# Patient Record
Sex: Female | Born: 1945
Health system: Southern US, Community
[De-identification: ages and names within clinical notes are randomized; demographics above are authoritative.]

## PROBLEM LIST (undated history)

## (undated) DIAGNOSIS — R7309 Other abnormal glucose: Secondary | ICD-10-CM

## (undated) DIAGNOSIS — E785 Hyperlipidemia, unspecified: Secondary | ICD-10-CM

## (undated) DIAGNOSIS — J449 Chronic obstructive pulmonary disease, unspecified: Secondary | ICD-10-CM

## (undated) DIAGNOSIS — K9041 Non-celiac gluten sensitivity: Secondary | ICD-10-CM

## (undated) DIAGNOSIS — R202 Paresthesia of skin: Secondary | ICD-10-CM

## (undated) DIAGNOSIS — T7840XA Allergy, unspecified, initial encounter: Secondary | ICD-10-CM

## (undated) DIAGNOSIS — J45909 Unspecified asthma, uncomplicated: Secondary | ICD-10-CM

## (undated) DIAGNOSIS — J4489 Other specified chronic obstructive pulmonary disease: Secondary | ICD-10-CM

## (undated) DIAGNOSIS — K219 Gastro-esophageal reflux disease without esophagitis: Secondary | ICD-10-CM

## (undated) DIAGNOSIS — F3289 Other specified depressive episodes: Secondary | ICD-10-CM

## (undated) DIAGNOSIS — H269 Unspecified cataract: Secondary | ICD-10-CM

## (undated) DIAGNOSIS — F419 Anxiety disorder, unspecified: Secondary | ICD-10-CM

## (undated) DIAGNOSIS — M129 Arthropathy, unspecified: Secondary | ICD-10-CM

## (undated) DIAGNOSIS — M542 Cervicalgia: Secondary | ICD-10-CM

## (undated) DIAGNOSIS — M549 Dorsalgia, unspecified: Secondary | ICD-10-CM

## (undated) DIAGNOSIS — I10 Essential (primary) hypertension: Secondary | ICD-10-CM

## (undated) DIAGNOSIS — M949 Disorder of cartilage, unspecified: Secondary | ICD-10-CM

## (undated) DIAGNOSIS — F329 Major depressive disorder, single episode, unspecified: Secondary | ICD-10-CM

## (undated) DIAGNOSIS — M899 Disorder of bone, unspecified: Secondary | ICD-10-CM

## (undated) HISTORY — DX: Disorder of bone, unspecified: M89.9

## (undated) HISTORY — DX: Major depressive disorder, single episode, unspecified: F32.9

## (undated) HISTORY — PX: CARPAL TUNNEL RELEASE: SHX101

## (undated) HISTORY — PX: ELBOW SURGERY: SHX618

## (undated) HISTORY — DX: Dorsalgia, unspecified: M54.9

## (undated) HISTORY — DX: Paresthesia of skin: R20.2

## (undated) HISTORY — DX: Unspecified asthma, uncomplicated: J45.909

## (undated) HISTORY — PX: ABDOMINAL HYSTERECTOMY: SHX81

## (undated) HISTORY — DX: Cervicalgia: M54.2

## (undated) HISTORY — DX: Chronic obstructive pulmonary disease, unspecified: J44.9

## (undated) HISTORY — DX: Other abnormal glucose: R73.09

## (undated) HISTORY — DX: Arthropathy, unspecified: M12.9

## (undated) HISTORY — DX: Essential (primary) hypertension: I10

## (undated) HISTORY — DX: Other specified depressive episodes: F32.89

## (undated) HISTORY — DX: Unspecified cataract: H26.9

## (undated) HISTORY — DX: Gastro-esophageal reflux disease without esophagitis: K21.9

## (undated) HISTORY — PX: COLONOSCOPY: SHX174

## (undated) HISTORY — DX: Disorder of bone, unspecified: M94.9

## (undated) HISTORY — DX: Non-celiac gluten sensitivity: K90.41

## (undated) HISTORY — DX: Allergy, unspecified, initial encounter: T78.40XA

## (undated) HISTORY — DX: Hyperlipidemia, unspecified: E78.5

## (undated) HISTORY — DX: Anxiety disorder, unspecified: F41.9

## (undated) HISTORY — DX: Other specified chronic obstructive pulmonary disease: J44.89

---

## 1998-03-30 ENCOUNTER — Other Ambulatory Visit: Admission: RE | Admit: 1998-03-30 | Discharge: 1998-03-30 | Payer: Self-pay | Admitting: Gynecology

## 1998-06-10 ENCOUNTER — Ambulatory Visit (HOSPITAL_COMMUNITY): Admission: RE | Admit: 1998-06-10 | Discharge: 1998-06-10 | Payer: Self-pay | Admitting: Internal Medicine

## 1998-09-22 ENCOUNTER — Other Ambulatory Visit: Admission: RE | Admit: 1998-09-22 | Discharge: 1998-09-22 | Payer: Self-pay | Admitting: Gynecology

## 1998-11-17 ENCOUNTER — Other Ambulatory Visit: Admission: RE | Admit: 1998-11-17 | Discharge: 1998-11-17 | Payer: Self-pay | Admitting: Otolaryngology

## 1999-03-24 ENCOUNTER — Ambulatory Visit (HOSPITAL_COMMUNITY): Admission: RE | Admit: 1999-03-24 | Discharge: 1999-03-24 | Payer: Self-pay | Admitting: Internal Medicine

## 1999-03-24 ENCOUNTER — Encounter: Payer: Self-pay | Admitting: Internal Medicine

## 1999-11-23 ENCOUNTER — Ambulatory Visit (HOSPITAL_BASED_OUTPATIENT_CLINIC_OR_DEPARTMENT_OTHER): Admission: RE | Admit: 1999-11-23 | Discharge: 1999-11-23 | Payer: Self-pay | Admitting: Orthopedic Surgery

## 2000-02-09 ENCOUNTER — Other Ambulatory Visit: Admission: RE | Admit: 2000-02-09 | Discharge: 2000-02-09 | Payer: Self-pay | Admitting: Gynecology

## 2000-05-16 ENCOUNTER — Ambulatory Visit (HOSPITAL_BASED_OUTPATIENT_CLINIC_OR_DEPARTMENT_OTHER): Admission: RE | Admit: 2000-05-16 | Discharge: 2000-05-16 | Payer: Self-pay | Admitting: Orthopedic Surgery

## 2001-04-25 ENCOUNTER — Other Ambulatory Visit: Admission: RE | Admit: 2001-04-25 | Discharge: 2001-04-25 | Payer: Self-pay | Admitting: Gynecology

## 2001-08-29 ENCOUNTER — Other Ambulatory Visit: Admission: RE | Admit: 2001-08-29 | Discharge: 2001-08-29 | Payer: Self-pay | Admitting: Gynecology

## 2002-06-05 ENCOUNTER — Other Ambulatory Visit: Admission: RE | Admit: 2002-06-05 | Discharge: 2002-06-05 | Payer: Self-pay | Admitting: Gynecology

## 2003-07-24 ENCOUNTER — Other Ambulatory Visit: Admission: RE | Admit: 2003-07-24 | Discharge: 2003-07-24 | Payer: Self-pay | Admitting: Gynecology

## 2004-08-18 ENCOUNTER — Other Ambulatory Visit: Admission: RE | Admit: 2004-08-18 | Discharge: 2004-08-18 | Payer: Self-pay | Admitting: Gynecology

## 2005-01-07 ENCOUNTER — Ambulatory Visit: Payer: Self-pay | Admitting: Internal Medicine

## 2005-01-19 ENCOUNTER — Ambulatory Visit: Payer: Self-pay | Admitting: Internal Medicine

## 2005-04-08 ENCOUNTER — Ambulatory Visit: Payer: Self-pay | Admitting: Internal Medicine

## 2005-04-27 ENCOUNTER — Ambulatory Visit: Payer: Self-pay | Admitting: Internal Medicine

## 2005-06-07 ENCOUNTER — Ambulatory Visit: Payer: Self-pay | Admitting: Internal Medicine

## 2005-06-22 ENCOUNTER — Ambulatory Visit: Payer: Self-pay | Admitting: Internal Medicine

## 2005-12-21 ENCOUNTER — Ambulatory Visit: Payer: Self-pay | Admitting: Internal Medicine

## 2005-12-28 ENCOUNTER — Ambulatory Visit: Payer: Self-pay | Admitting: Internal Medicine

## 2006-04-05 ENCOUNTER — Ambulatory Visit: Payer: Self-pay | Admitting: Internal Medicine

## 2006-09-06 ENCOUNTER — Ambulatory Visit: Payer: Self-pay | Admitting: Internal Medicine

## 2006-11-29 ENCOUNTER — Ambulatory Visit: Payer: Self-pay | Admitting: Internal Medicine

## 2007-01-26 ENCOUNTER — Ambulatory Visit: Payer: Self-pay | Admitting: Internal Medicine

## 2007-06-06 ENCOUNTER — Ambulatory Visit: Payer: Self-pay | Admitting: Internal Medicine

## 2007-06-06 LAB — CONVERTED CEMR LAB
ALT: 19 units/L (ref 0–35)
AST: 20 units/L (ref 0–37)
Albumin: 3.9 g/dL (ref 3.5–5.2)
Alkaline Phosphatase: 55 units/L (ref 39–117)
BUN: 10 mg/dL (ref 6–23)
Basophils Absolute: 0 10*3/uL (ref 0.0–0.1)
Basophils Relative: 0.6 % (ref 0.0–1.0)
Bilirubin Urine: NEGATIVE
Bilirubin, Direct: 0.1 mg/dL (ref 0.0–0.3)
CO2: 32 meq/L (ref 19–32)
Calcium: 9.8 mg/dL (ref 8.4–10.5)
Chloride: 105 meq/L (ref 96–112)
Creatinine, Ser: 0.5 mg/dL (ref 0.4–1.2)
Eosinophils Absolute: 0.2 10*3/uL (ref 0.0–0.6)
Eosinophils Relative: 3.3 % (ref 0.0–5.0)
GFR calc Af Amer: 161 mL/min
GFR calc non Af Amer: 133 mL/min
Glucose, Bld: 120 mg/dL — ABNORMAL HIGH (ref 70–99)
HCT: 44.5 % (ref 36.0–46.0)
Hemoglobin, Urine: NEGATIVE
Hemoglobin: 15.2 g/dL — ABNORMAL HIGH (ref 12.0–15.0)
Ketones, ur: NEGATIVE mg/dL
Leukocytes, UA: NEGATIVE
Lymphocytes Relative: 37.1 % (ref 12.0–46.0)
MCHC: 34.1 g/dL (ref 30.0–36.0)
MCV: 86.6 fL (ref 78.0–100.0)
Monocytes Absolute: 0.4 10*3/uL (ref 0.2–0.7)
Monocytes Relative: 6.7 % (ref 3.0–11.0)
Neutro Abs: 3.6 10*3/uL (ref 1.4–7.7)
Neutrophils Relative %: 52.3 % (ref 43.0–77.0)
Nitrite: NEGATIVE
Platelets: 198 10*3/uL (ref 150–400)
Potassium: 4.4 meq/L (ref 3.5–5.1)
RBC: 5.14 M/uL — ABNORMAL HIGH (ref 3.87–5.11)
RDW: 12.8 % (ref 11.5–14.6)
Sed Rate: 7 mm/hr (ref 0–25)
Sodium: 143 meq/L (ref 135–145)
Specific Gravity, Urine: >=1.03 (ref 1.000–1.03)
TSH: 1.03 microintl units/mL (ref 0.35–5.50)
Total Bilirubin: 0.7 mg/dL (ref 0.3–1.2)
Total Protein, Urine: NEGATIVE mg/dL
Total Protein: 7.1 g/dL (ref 6.0–8.3)
Urine Glucose: NEGATIVE mg/dL
Urobilinogen, UA: 0.2 (ref 0.0–1.0)
Vitamin B-12: 509 pg/mL (ref 211–911)
WBC: 6.7 10*3/uL (ref 4.5–10.5)
pH: 6 (ref 5.0–8.0)

## 2007-08-08 ENCOUNTER — Encounter: Payer: Self-pay | Admitting: *Deleted

## 2007-08-08 DIAGNOSIS — Z9889 Other specified postprocedural states: Secondary | ICD-10-CM

## 2007-08-08 DIAGNOSIS — M129 Arthropathy, unspecified: Secondary | ICD-10-CM

## 2007-08-08 DIAGNOSIS — Z9079 Acquired absence of other genital organ(s): Secondary | ICD-10-CM | POA: Insufficient documentation

## 2007-08-08 DIAGNOSIS — J449 Chronic obstructive pulmonary disease, unspecified: Secondary | ICD-10-CM | POA: Insufficient documentation

## 2007-08-08 DIAGNOSIS — M949 Disorder of cartilage, unspecified: Secondary | ICD-10-CM

## 2007-08-08 DIAGNOSIS — R7309 Other abnormal glucose: Secondary | ICD-10-CM

## 2007-08-08 DIAGNOSIS — K219 Gastro-esophageal reflux disease without esophagitis: Secondary | ICD-10-CM | POA: Insufficient documentation

## 2007-08-08 DIAGNOSIS — F331 Major depressive disorder, recurrent, moderate: Secondary | ICD-10-CM | POA: Insufficient documentation

## 2007-08-08 DIAGNOSIS — M899 Disorder of bone, unspecified: Secondary | ICD-10-CM | POA: Insufficient documentation

## 2007-08-08 DIAGNOSIS — E785 Hyperlipidemia, unspecified: Secondary | ICD-10-CM

## 2007-09-05 ENCOUNTER — Ambulatory Visit: Payer: Self-pay | Admitting: Internal Medicine

## 2007-09-05 DIAGNOSIS — F4323 Adjustment disorder with mixed anxiety and depressed mood: Secondary | ICD-10-CM

## 2007-10-22 ENCOUNTER — Telehealth: Payer: Self-pay | Admitting: Internal Medicine

## 2007-12-21 ENCOUNTER — Telehealth: Payer: Self-pay | Admitting: Internal Medicine

## 2007-12-21 ENCOUNTER — Encounter: Payer: Self-pay | Admitting: Internal Medicine

## 2008-01-02 ENCOUNTER — Ambulatory Visit: Payer: Self-pay | Admitting: Internal Medicine

## 2008-01-02 DIAGNOSIS — R61 Generalized hyperhidrosis: Secondary | ICD-10-CM

## 2008-01-03 LAB — CONVERTED CEMR LAB
ALT: 19 units/L (ref 0–35)
AST: 19 units/L (ref 0–37)
Albumin: 4.2 g/dL (ref 3.5–5.2)
Alkaline Phosphatase: 53 units/L (ref 39–117)
BUN: 6 mg/dL (ref 6–23)
Basophils Absolute: 0.1 10*3/uL (ref 0.0–0.1)
Basophils Relative: 1 % (ref 0.0–1.0)
Bilirubin, Direct: 0.2 mg/dL (ref 0.0–0.3)
CO2: 32 meq/L (ref 19–32)
Calcium: 9.6 mg/dL (ref 8.4–10.5)
Chloride: 108 meq/L (ref 96–112)
Cholesterol: 235 mg/dL (ref 0–200)
Creatinine, Ser: 0.6 mg/dL (ref 0.4–1.2)
Direct LDL: 167.6 mg/dL
Eosinophils Absolute: 0.2 10*3/uL (ref 0.0–0.6)
Eosinophils Relative: 3.5 % (ref 0.0–5.0)
GFR calc Af Amer: 130 mL/min
GFR calc non Af Amer: 108 mL/min
Glucose, Bld: 103 mg/dL — ABNORMAL HIGH (ref 70–99)
HCT: 47.6 % — ABNORMAL HIGH (ref 36.0–46.0)
HDL: 45.5 mg/dL (ref >39.0)
Hemoglobin: 15.6 g/dL — ABNORMAL HIGH (ref 12.0–15.0)
Hgb A1c MFr Bld: 5.6 % (ref 4.6–6.0)
Lymphocytes Relative: 39.2 % (ref 12.0–46.0)
MCHC: 32.9 g/dL (ref 30.0–36.0)
MCV: 87.8 fL (ref 78.0–100.0)
Monocytes Absolute: 0.5 10*3/uL (ref 0.2–0.7)
Monocytes Relative: 6.5 % (ref 3.0–11.0)
Neutro Abs: 3.5 10*3/uL (ref 1.4–7.7)
Neutrophils Relative %: 49.8 % (ref 43.0–77.0)
Platelets: 206 10*3/uL (ref 150–400)
Potassium: 4.4 meq/L (ref 3.5–5.1)
RBC: 5.42 M/uL — ABNORMAL HIGH (ref 3.87–5.11)
RDW: 12.2 % (ref 11.5–14.6)
Sodium: 145 meq/L (ref 135–145)
TSH: 1.47 microintl units/mL (ref 0.35–5.50)
Total Bilirubin: 0.8 mg/dL (ref 0.3–1.2)
Total CHOL/HDL Ratio: 5.2
Total Protein: 7.1 g/dL (ref 6.0–8.3)
Triglycerides: 98 mg/dL (ref 0–149)
VLDL: 20 mg/dL (ref 0–40)
WBC: 7.1 10*3/uL (ref 4.5–10.5)

## 2008-01-09 ENCOUNTER — Encounter: Payer: Self-pay | Admitting: Internal Medicine

## 2008-03-27 ENCOUNTER — Encounter: Payer: Self-pay | Admitting: Internal Medicine

## 2008-05-05 ENCOUNTER — Telehealth: Payer: Self-pay | Admitting: Internal Medicine

## 2008-05-21 ENCOUNTER — Telehealth: Payer: Self-pay | Admitting: Internal Medicine

## 2008-05-22 ENCOUNTER — Ambulatory Visit: Payer: Self-pay | Admitting: Internal Medicine

## 2008-05-22 DIAGNOSIS — F17200 Nicotine dependence, unspecified, uncomplicated: Secondary | ICD-10-CM | POA: Insufficient documentation

## 2008-09-15 ENCOUNTER — Telehealth: Payer: Self-pay | Admitting: Internal Medicine

## 2008-11-18 ENCOUNTER — Telehealth: Payer: Self-pay | Admitting: Internal Medicine

## 2008-12-02 ENCOUNTER — Ambulatory Visit: Payer: Self-pay | Admitting: Internal Medicine

## 2008-12-04 LAB — CONVERTED CEMR LAB
ALT: 20 units/L (ref 0–35)
AST: 18 units/L (ref 0–37)
Albumin: 4 g/dL (ref 3.5–5.2)
Alkaline Phosphatase: 40 units/L (ref 39–117)
BUN: 15 mg/dL (ref 6–23)
Bilirubin, Direct: 0.2 mg/dL (ref 0.0–0.3)
CO2: 29 meq/L (ref 19–32)
Calcium: 9.4 mg/dL (ref 8.4–10.5)
Chloride: 102 meq/L (ref 96–112)
Cholesterol: 221 mg/dL (ref 0–200)
Creatinine, Ser: 0.7 mg/dL (ref 0.4–1.2)
Direct LDL: 158 mg/dL
GFR calc Af Amer: 109 mL/min
GFR calc non Af Amer: 90 mL/min
Glucose, Bld: 107 mg/dL — ABNORMAL HIGH (ref 70–99)
HDL: 44.6 mg/dL (ref >39.0)
Potassium: 3.5 meq/L (ref 3.5–5.1)
Sodium: 140 meq/L (ref 135–145)
TSH: 1.07 microintl units/mL (ref 0.35–5.50)
Total Bilirubin: 1.1 mg/dL (ref 0.3–1.2)
Total CHOL/HDL Ratio: 5
Total Protein: 6.7 g/dL (ref 6.0–8.3)
Triglycerides: 63 mg/dL (ref 0–149)
VLDL: 13 mg/dL (ref 0–40)

## 2008-12-24 ENCOUNTER — Ambulatory Visit: Payer: Self-pay | Admitting: Internal Medicine

## 2009-03-18 ENCOUNTER — Ambulatory Visit: Payer: Self-pay | Admitting: Internal Medicine

## 2009-03-18 LAB — CONVERTED CEMR LAB: Vit D, 25-Hydroxy: 44 ng/mL (ref 30–89)

## 2009-03-21 LAB — CONVERTED CEMR LAB
ALT: 18 units/L (ref 0–35)
AST: 18 units/L (ref 0–37)
Albumin: 4.1 g/dL (ref 3.5–5.2)
Alkaline Phosphatase: 45 units/L (ref 39–117)
BUN: 11 mg/dL (ref 6–23)
Basophils Absolute: 0 10*3/uL (ref 0.0–0.1)
Basophils Relative: 0.6 % (ref 0.0–3.0)
Bilirubin, Direct: 0.2 mg/dL (ref 0.0–0.3)
CO2: 32 meq/L (ref 19–32)
Calcium: 9.6 mg/dL (ref 8.4–10.5)
Chloride: 112 meq/L (ref 96–112)
Creatinine, Ser: 0.7 mg/dL (ref 0.4–1.2)
Eosinophils Absolute: 0.2 10*3/uL (ref 0.0–0.7)
Eosinophils Relative: 4.1 % (ref 0.0–5.0)
GFR calc non Af Amer: 89.66 mL/min (ref >60)
Glucose, Bld: 115 mg/dL — ABNORMAL HIGH (ref 70–99)
HCT: 46.5 % — ABNORMAL HIGH (ref 36.0–46.0)
Hemoglobin: 16.2 g/dL — ABNORMAL HIGH (ref 12.0–15.0)
Lymphocytes Relative: 42.9 % (ref 12.0–46.0)
Lymphs Abs: 2.4 10*3/uL (ref 0.7–4.0)
MCHC: 34.8 g/dL (ref 30.0–36.0)
MCV: 87.4 fL (ref 78.0–100.0)
Monocytes Absolute: 0.5 10*3/uL (ref 0.1–1.0)
Monocytes Relative: 7.9 % (ref 3.0–12.0)
Neutro Abs: 2.6 10*3/uL (ref 1.4–7.7)
Neutrophils Relative %: 44.5 % (ref 43.0–77.0)
Platelets: 162 10*3/uL (ref 150.0–400.0)
Potassium: 4.8 meq/L (ref 3.5–5.1)
RBC: 5.32 M/uL — ABNORMAL HIGH (ref 3.87–5.11)
RDW: 12.8 % (ref 11.5–14.6)
Sodium: 145 meq/L (ref 135–145)
TSH: 1.32 microintl units/mL (ref 0.35–5.50)
Total Bilirubin: 0.9 mg/dL (ref 0.3–1.2)
Total Protein: 7 g/dL (ref 6.0–8.3)
Vitamin B-12: 861 pg/mL (ref 211–911)
WBC: 5.7 10*3/uL (ref 4.5–10.5)

## 2009-03-25 ENCOUNTER — Ambulatory Visit: Payer: Self-pay | Admitting: Internal Medicine

## 2009-04-03 ENCOUNTER — Telehealth: Payer: Self-pay | Admitting: Internal Medicine

## 2009-05-01 ENCOUNTER — Ambulatory Visit: Payer: Self-pay | Admitting: Internal Medicine

## 2009-05-29 ENCOUNTER — Encounter: Payer: Self-pay | Admitting: Internal Medicine

## 2009-07-15 ENCOUNTER — Ambulatory Visit: Payer: Self-pay | Admitting: Internal Medicine

## 2009-08-14 ENCOUNTER — Ambulatory Visit: Payer: Self-pay | Admitting: Internal Medicine

## 2009-09-14 ENCOUNTER — Telehealth: Payer: Self-pay | Admitting: Internal Medicine

## 2009-09-16 ENCOUNTER — Telehealth: Payer: Self-pay | Admitting: Internal Medicine

## 2009-09-25 ENCOUNTER — Telehealth: Payer: Self-pay | Admitting: Internal Medicine

## 2009-12-09 ENCOUNTER — Telehealth: Payer: Self-pay | Admitting: Internal Medicine

## 2010-01-11 ENCOUNTER — Telehealth: Payer: Self-pay | Admitting: Internal Medicine

## 2010-01-21 ENCOUNTER — Telehealth: Payer: Self-pay | Admitting: Internal Medicine

## 2010-01-21 ENCOUNTER — Ambulatory Visit: Payer: Self-pay | Admitting: Internal Medicine

## 2010-03-11 ENCOUNTER — Telehealth: Payer: Self-pay | Admitting: Internal Medicine

## 2010-03-25 ENCOUNTER — Telehealth: Payer: Self-pay | Admitting: Internal Medicine

## 2010-05-05 ENCOUNTER — Ambulatory Visit: Payer: Self-pay | Admitting: Internal Medicine

## 2010-05-05 ENCOUNTER — Encounter: Payer: Self-pay | Admitting: Internal Medicine

## 2010-05-05 ENCOUNTER — Encounter: Payer: Self-pay | Admitting: Gastroenterology

## 2010-05-05 DIAGNOSIS — R1310 Dysphagia, unspecified: Secondary | ICD-10-CM

## 2010-05-05 DIAGNOSIS — K5909 Other constipation: Secondary | ICD-10-CM

## 2010-05-05 DIAGNOSIS — R209 Unspecified disturbances of skin sensation: Secondary | ICD-10-CM | POA: Insufficient documentation

## 2010-05-05 LAB — CONVERTED CEMR LAB: Vit D, 25-Hydroxy: 56 ng/mL (ref 30–89)

## 2010-05-06 LAB — CONVERTED CEMR LAB
ALT: 16 units/L (ref 0–35)
AST: 17 units/L (ref 0–37)
Albumin: 4.4 g/dL (ref 3.5–5.2)
Alkaline Phosphatase: 49 units/L (ref 39–117)
BUN: 17 mg/dL (ref 6–23)
Basophils Absolute: 0 10*3/uL (ref 0.0–0.1)
Basophils Relative: 0.4 % (ref 0.0–3.0)
Bilirubin Urine: NEGATIVE
Bilirubin, Direct: 0.1 mg/dL (ref 0.0–0.3)
CO2: 29 meq/L (ref 19–32)
Calcium: 9.1 mg/dL (ref 8.4–10.5)
Chloride: 101 meq/L (ref 96–112)
Cholesterol: 221 mg/dL — ABNORMAL HIGH (ref 0–200)
Creatinine, Ser: 0.6 mg/dL (ref 0.4–1.2)
Direct LDL: 148.2 mg/dL
Eosinophils Absolute: 0.2 10*3/uL (ref 0.0–0.7)
Eosinophils Relative: 2.9 % (ref 0.0–5.0)
GFR calc non Af Amer: 108.82 mL/min (ref >60)
Glucose, Bld: 106 mg/dL — ABNORMAL HIGH (ref 70–99)
H Pylori IgG: NEGATIVE
HCT: 46.4 % — ABNORMAL HIGH (ref 36.0–46.0)
HDL: 50.9 mg/dL (ref >39.00)
Hemoglobin, Urine: NEGATIVE
Hemoglobin: 16 g/dL — ABNORMAL HIGH (ref 12.0–15.0)
Ketones, ur: NEGATIVE mg/dL
Leukocytes, UA: NEGATIVE
Lymphocytes Relative: 30.9 % (ref 12.0–46.0)
Lymphs Abs: 2.4 10*3/uL (ref 0.7–4.0)
MCHC: 34.5 g/dL (ref 30.0–36.0)
MCV: 87.8 fL (ref 78.0–100.0)
Monocytes Absolute: 0.7 10*3/uL (ref 0.1–1.0)
Monocytes Relative: 8.5 % (ref 3.0–12.0)
Neutro Abs: 4.5 10*3/uL (ref 1.4–7.7)
Neutrophils Relative %: 57.3 % (ref 43.0–77.0)
Nitrite: NEGATIVE
Platelets: 179 10*3/uL (ref 150.0–400.0)
Potassium: 4.2 meq/L (ref 3.5–5.1)
RBC: 5.28 M/uL — ABNORMAL HIGH (ref 3.87–5.11)
RDW: 14 % (ref 11.5–14.6)
Sodium: 135 meq/L (ref 135–145)
Specific Gravity, Urine: >=1.03 (ref 1.000–1.030)
TSH: 1.97 microintl units/mL (ref 0.35–5.50)
Total Bilirubin: 1.2 mg/dL (ref 0.3–1.2)
Total CHOL/HDL Ratio: 4
Total Protein, Urine: NEGATIVE mg/dL
Total Protein: 7.4 g/dL (ref 6.0–8.3)
Triglycerides: 91 mg/dL (ref 0.0–149.0)
Urine Glucose: NEGATIVE mg/dL
Urobilinogen, UA: 0.2 (ref 0.0–1.0)
VLDL: 18.2 mg/dL (ref 0.0–40.0)
Vitamin B-12: 700 pg/mL (ref 211–911)
WBC: 7.8 10*3/uL (ref 4.5–10.5)
pH: 5 (ref 5.0–8.0)

## 2010-05-26 ENCOUNTER — Ambulatory Visit: Payer: Self-pay | Admitting: Internal Medicine

## 2010-06-09 ENCOUNTER — Ambulatory Visit: Payer: Self-pay | Admitting: Family Medicine

## 2010-06-09 ENCOUNTER — Encounter: Payer: Self-pay | Admitting: Internal Medicine

## 2010-06-09 ENCOUNTER — Telehealth: Payer: Self-pay | Admitting: Internal Medicine

## 2010-08-04 ENCOUNTER — Ambulatory Visit: Payer: Self-pay | Admitting: Internal Medicine

## 2010-08-04 DIAGNOSIS — D179 Benign lipomatous neoplasm, unspecified: Secondary | ICD-10-CM | POA: Insufficient documentation

## 2010-08-25 ENCOUNTER — Ambulatory Visit: Payer: Self-pay | Admitting: Internal Medicine

## 2010-08-25 DIAGNOSIS — M545 Low back pain: Secondary | ICD-10-CM

## 2010-08-25 DIAGNOSIS — M542 Cervicalgia: Secondary | ICD-10-CM

## 2010-08-25 DIAGNOSIS — M79609 Pain in unspecified limb: Secondary | ICD-10-CM

## 2010-10-13 ENCOUNTER — Ambulatory Visit
Admission: RE | Admit: 2010-10-13 | Discharge: 2010-10-13 | Payer: Self-pay | Source: Home / Self Care | Attending: Internal Medicine | Admitting: Internal Medicine

## 2010-10-28 ENCOUNTER — Encounter: Payer: Self-pay | Admitting: Internal Medicine

## 2010-10-28 LAB — HM MAMMOGRAPHY: HM Mammogram: NORMAL

## 2010-11-11 NOTE — Assessment & Plan Note (Signed)
Summary: 3 mo rov /nws  #   Vital Signs:  Patient profile:   65 year old female Height:      63 inches Weight:      154 pounds BMI:     27.38 Temp:     97.2 degrees F oral Pulse rate:   88 / minute Pulse rhythm:   regular Resp:     16 per minute BP sitting:   108 / 72  (left arm) Cuff size:   regular  Vitals Entered By: Lanier Prude, CMA(AAMA) (August 25, 2010 1:41 PM) CC: f/u Comments needs to discuss alt to hydroco-acetam   CC:  f/u.  History of Present Illness: C/o neck pain 9/10 on R, trap and down L arm and hand - severe - can't sleep. Meds don't help.  Current Medications (verified): 1)  Advair Diskus 250-50 Mcg/dose  Misc (Fluticasone-Salmeterol) .... Use Twice Daily 2)  Spiriva Handihaler 18 Mcg  Caps (Tiotropium Bromide Monohydrate) .... Use Daily As Directed 3)  Omega-3 350 Mg  Caps (Omega-3 Fatty Acids) .... Take One Tablet Once Daily 4)  Proair Hfa 108 (90 Base) Mcg/act  Aers (Albuterol Sulfate) .... 2 Inh Q4h As Needed Shortness of Breath 5)  Diethylpropion Hcl 25 Mg  Tabs (Diethylpropion Hcl) .Marland Kitchen.. 1 By Mouth Three Times A Day As Needed  Please,  Fill On or After   09/04/10         . Ok To Fill 1-2 Day Earlier When The Month Has 31 Days in It. 6)  Premarin 0.625 Mg  Tabs (Estrogens Conjugated) .... Take 1 By Mouth Qd 7)  Vitamin D3 1000 Unit  Tabs (Cholecalciferol) .... Take 2 Tablets Once Daily 8)  Temazepam 15 Mg Caps (Temazepam) .Marland Kitchen.. 1-2 Q Hs As Needed Insomnia 9)  Maxifed-G Cdx 40-20-400 Mg Tabs (Pseudoephedrine-Codeine-Gg) .Marland Kitchen.. 1 By Mouth Two Times A Day As Needed Cough 10)  Tramadol Hcl 50 Mg Tabs (Tramadol Hcl) .Marland Kitchen.. 1-2 Tabs By Mouth Two Times A Day As Needed Pain 11)  Hydrocodone-Acetaminophen 10-325 Mg Tabs (Hydrocodone-Acetaminophen) .Marland Kitchen.. 1 By Mouth Qid As Needed Pain 12)  Alprazolam 1 Mg Tabs (Alprazolam) .... 1/2 -1 Tab By Mouth Three Times A Day As Needed Anxiety 13)  Wellbutrin Sr 150 Mg Xr12h-Tab (Bupropion Hcl) .Marland Kitchen.. 1 By Mouth Q Am and Q Lunch  (Two Times A Day) 14)  Vimovo 500-20 Mg Tbec (Naproxen-Esomeprazole) .Marland Kitchen.. 1 By Mouth Once Daily - Two Times A Day Pc As Needed Pain  Allergies (verified): 1)  ! Vimovo (Naproxen-Esomeprazole) 2)  Prozac 3)  * Lexapro 4)  Doxycycline 5)  Cymbalta (Duloxetine Hcl) 6)  Pristiq (Desvenlafaxine Succinate)  Past History:  Social History: Last updated: 01/02/2008 Married Current Smoker Alcohol use-no Regular exercise-no  Past Medical History: GERD (ICD-530.81) OSTEOPENIA (ICD-733.90) ARTHRITIS (ICD-716.90) Hx of HYPERGLYCEMIA (ICD-790.29) HYPERLIPIDEMIA (ICD-272.4) DEPRESSION (ICD-311) COPD (ICD-496) POSS GLUTEN ALLERGY  Anxiety Osteopenia Neck pain Low back pain Paresth R 1-2-3 fingers post CTS surgery  Past Surgical History: HYSTERECTOMY, HX OF (ICD-V45.77) * BILATERAL ELBOW SURGERY CARPAL TUNNEL RELEASE, BILATERAL, HX OF (ICD-V45.89) B  Review of Systems  The patient denies chest pain and dyspnea on exertion.         R fingers 1-2-3 are numb post CTS surgery  Physical Exam  General:  Well-developed,well-nourished,in no acute distress; alert,appropriate and cooperative throughout examination Nose:  External nasal examination shows no deformity or inflammation. Nasal mucosa are eryth and moist without lesions or exudates. Mouth:  Clear eryth throat w/o suspicious lesions  Neck:  No deformities, masses, or tenderness noted. Lungs:  B rhonchi Heart:  Normal rate and regular rhythm. S1 and S2 normal without gallop, murmur, click, rub or other extra sounds. Abdomen:  Bowel sounds positive,abdomen soft and non-tender without masses, organomegaly or hernias noted. Msk:  No deformity or scoliosis noted of thoracic or lumbar spine.  Lumbar-sacral spine is tender to palpation over paraspinal muscles and painfull with the ROM  Neurologic:  No cranial nerve deficits noted. Station and gait are normal. Plantar reflexes are down-going bilaterally. DTRs are symmetrical throughout.  Sensory, motor and coordinative functions appear intact. Skin:  L chest wall posterolat subcutaneouse 3.0x2.5 cm NT Psych:  Oriented X3 and  less depressed affect.     Impression & Recommendations:  Problem # 1:  NECK PAIN (ICD-723.1) Assessment Deteriorated  Trigger point inj offered  The following medications were removed from the medication list:    Tramadol Hcl 50 Mg Tabs (Tramadol hcl) .Marland Kitchen... 1-2 tabs by mouth two times a day as needed pain    Hydrocodone-acetaminophen 10-325 Mg Tabs (Hydrocodone-acetaminophen) .Marland Kitchen... 1 by mouth qid as needed pain    Vimovo 500-20 Mg Tbec (Naproxen-esomeprazole) .Marland Kitchen... 1 by mouth once daily - two times a day pc as needed pain Her updated medication list for this problem includes:    Percocet 10-325 Mg Tabs (Oxycodone-acetaminophen) .Marland Kitchen... 1 by mouth qd-tid as needed pain  Orders: Physical Therapy Referral (PT) Physical Therapy Referral (PT)  Problem # 2:  ARM PAIN (ICD-729.5) R Assessment: Deteriorated  Orders: Physical Therapy Referral (PT)  Problem # 3:  PARESTHESIA (ICD-782.0) R arm Assessment: Deteriorated  Problem # 4:  COPD (ICD-496) Assessment: Unchanged  Her updated medication list for this problem includes:    Advair Diskus 250-50 Mcg/dose Misc (Fluticasone-salmeterol) ..... Use twice daily    Spiriva Handihaler 18 Mcg Caps (Tiotropium bromide monohydrate) ..... Use daily as directed    Proair Hfa 108 (90 Base) Mcg/act Aers (Albuterol sulfate) .Marland Kitchen... 2 inh q4h as needed shortness of breath  Orders: T-2 View CXR, Same Day (71020.5TC)  Complete Medication List: 1)  Advair Diskus 250-50 Mcg/dose Misc (Fluticasone-salmeterol) .... Use twice daily 2)  Spiriva Handihaler 18 Mcg Caps (Tiotropium bromide monohydrate) .... Use daily as directed 3)  Omega-3 350 Mg Caps (Omega-3 fatty acids) .... Take one tablet once daily 4)  Proair Hfa 108 (90 Base) Mcg/act Aers (Albuterol sulfate) .... 2 inh q4h as needed shortness of breath 5)   Diethylpropion Hcl 25 Mg Tabs (Diethylpropion hcl) .Marland Kitchen.. 1 by mouth three times a day as needed  please,  fill on or after   09/04/10         . ok to fill 1-2 day earlier when the month has 31 days in it. 6)  Premarin 0.625 Mg Tabs (Estrogens conjugated) .... Take 1 by mouth qd 7)  Vitamin D3 1000 Unit Tabs (Cholecalciferol) .... Take 2 tablets once daily 8)  Temazepam 15 Mg Caps (Temazepam) .Marland Kitchen.. 1-2 q hs as needed insomnia 9)  Maxifed-g Cdx 40-20-400 Mg Tabs (Pseudoephedrine-codeine-gg) .Marland Kitchen.. 1 by mouth two times a day as needed cough 10)  Alprazolam 1 Mg Tabs (Alprazolam) .... 1/2 -1 tab by mouth three times a day as needed anxiety 11)  Wellbutrin Sr 150 Mg Xr12h-tab (Bupropion hcl) .Marland Kitchen.. 1 by mouth q am and q lunch (two times a day) 12)  Percocet 10-325 Mg Tabs (Oxycodone-acetaminophen) .Marland Kitchen.. 1 by mouth qd-tid as needed pain  Patient Instructions: 1)  Please schedule a follow-up appointment  in 2 months. 2)  Contour pillow sPrescriptions: PERCOCET 10-325 MG TABS (OXYCODONE-ACETAMINOPHEN) 1 by mouth qd-tid as needed pain Brand medically necessary #100 x 0   Entered and Authorized by:   Tresa Garter MD   Signed by:   Tresa Garter MD on 08/25/2010   Method used:   Print then Give to Patient   RxID:   [id number]    Orders Added: 1)  Est. Patient Level IV [59563] 2)  Physical Therapy Referral [PT] 3)  T-2 View CXR, Same Day [71020.5TC] 4)  Physical Therapy Referral [PT]

## 2010-11-11 NOTE — Progress Notes (Signed)
Summary: diethylpropion  Phone Note Other Incoming Call back at fax   [phone] Summary of Call: refill on diethylpropion----ok x 1 refill/vg Initial call taken by: Tora Perches,  December 09, 2009 9:06 AM    Prescriptions: DIETHYLPROPION HCL 25 MG  TABS (DIETHYLPROPION HCL) 1 by mouth three times a day prn  #90 x 0   Entered by:   Tora Perches   Authorized by:   Tresa Garter MD   Signed by:   Tora Perches on 12/09/2009   Method used:   Telephoned to ...       [facility]  [phone]* (retail)       [address] [place], [place]  [address]       Ph: [phone] or [phone]       Fax: [phone]   RxID:   [id number]

## 2010-11-11 NOTE — Progress Notes (Signed)
Summary: Refill request  Phone Note Refill Request Message from:  [facility] [place]) on January 11, 2010 10:01 AM  Refills Requested: Medication #1:  DIETHYLPROPION HCL 25 MG  TABS 1 by mouth three times a day prn   Dosage confirmed as above?Dosage Confirmed Initial call taken by: Lamar Sprinkles, CMA,  January 11, 2010 10:03 AM/ Sydell Axon, SMA  Follow-up for Phone Call        ok to ref Follow-up by: Tresa Garter MD,  January 11, 2010 5:01 PM    Prescriptions: DIETHYLPROPION HCL 25 MG  TABS (DIETHYLPROPION HCL) 1 by mouth three times a day prn  #90 x 0   Entered by:   Lamar Sprinkles, CMA   Authorized by:   Tresa Garter MD   Signed by:   Lamar Sprinkles, CMA on 01/11/2010   Method used:   Telephoned to ...       [facility]  [phone]* (retail)       [address] [place], [place]  [address]       Ph: [phone] or [phone]       Fax: [phone]   RxID:   [id number]

## 2010-11-11 NOTE — Miscellaneous (Signed)
Summary: BONE DENSITY  Clinical Lists Changes  Orders: Added new Test order of T-Bone Densitometry (77080) - Signed Added new Test order of T-Lumbar Vertebral Assessment (77082) - Signed 

## 2010-11-11 NOTE — Progress Notes (Signed)
Summary: Alprazolam refill  Phone Note Refill Request Message from:  Fax from Pharmacy on January 21, 2010 11:27 AM  Refills Requested: Medication #1:  XANAX 0.5 MG TABS 1/2 or 1 by mouth three times a day as needed anxiety Initial call taken by: Lucious Groves,  January 21, 2010 11:27 AM  Follow-up for Phone Call        ok x 6 Follow-up by: Tresa Garter MD,  January 21, 2010 1:23 PM    Prescriptions: Theresa Aguirre 0.5 MG TABS (ALPRAZOLAM) 1/2 or 1 by mouth three times a day as needed anxiety  #90 x 6   Entered by:   Ami Bullins CMA   Authorized by:   Tresa Garter MD   Signed by:   Bill Salinas CMA on 01/21/2010   Method used:   Telephoned to ...       [facility]  [phone]* (retail)       [address] [place], [place]  [address]       Ph: [phone] or [phone]       Fax: [phone]   RxID:   [id number]

## 2010-11-11 NOTE — Progress Notes (Signed)
  Phone Note Call from Patient   Summary of Call: needs Rx Initial call taken by: Tresa Garter MD,  June 09, 2010 10:39 AM    Prescriptions: ALPRAZOLAM 1 MG TABS (ALPRAZOLAM) 1/2 -1 tab by mouth three times a day as needed anxiety  #90 x 6   Entered and Authorized by:   Tresa Garter MD   Signed by:   Tresa Garter MD on 06/09/2010   Method used:   Print then Give to Patient   RxID:   [id number] DIETHYLPROPION HCL 25 MG  TABS (DIETHYLPROPION HCL) 1 by mouth three times a day as needed Please,  fill on or after       02/10/10     . OK to fill 1-2 day earlier when the month has 31 days in it.  #90 x 0   Entered and Authorized by:   Tresa Garter MD   Signed by:   Tresa Garter MD on 06/09/2010   Method used:   Print then Give to Patient   RxID:   [id number]

## 2010-11-11 NOTE — Assessment & Plan Note (Signed)
Summary: medication/#/cd   Vital Signs:  Patient profile:   65 year old female Height:      65 inches Weight:      148.50 pounds BMI:     24.80 O2 Sat:      95 % on Room air Temp:     97.6 degrees F oral Pulse rate:   93 / minute BP sitting:   108 / 76  (left arm) Cuff size:   regular  Vitals Entered By: Lucious Groves (January 21, 2010 4:35 PM)  O2 Flow:  Room air CC: Med refill-Diethylpropion, Alprazolam and Temazepam alternative./kb Is Patient Diabetic? No Pain Assessment Patient in pain? no        CC:  Med refill-Diethylpropion and Alprazolam and Temazepam alternative./kb.  History of Present Illness: The patient presents for a follow up of back pain, anxiety, depression and headaches.   Current Medications (verified): 1)  Advair Diskus 250-50 Mcg/dose  Misc (Fluticasone-Salmeterol) .... Use Twice Daily 2)  Spiriva Handihaler 18 Mcg  Caps (Tiotropium Bromide Monohydrate) .... Use Daily As Directed 3)  Omega-3 350 Mg  Caps (Omega-3 Fatty Acids) .... Take One Tablet Once Daily 4)  Proair Hfa 108 (90 Base) Mcg/act  Aers (Albuterol Sulfate) .... 2 Inh Q4h As Needed Shortness of Breath 5)  Diethylpropion Hcl 25 Mg  Tabs (Diethylpropion Hcl) .Marland Kitchen.. 1 By Mouth Three Times A Day Prn 6)  Xanax 0.5 Mg Tabs (Alprazolam) .... 1/2 or 1 By Mouth Three Times A Day As Needed Anxiety 7)  Premarin 0.625 Mg  Tabs (Estrogens Conjugated) .... Take 1 By Mouth Qd 8)  Vitamin D3 1000 Unit  Tabs (Cholecalciferol) .... Take 2 Tablets Once Daily 9)  Temazepam 15 Mg Caps (Temazepam) .Marland Kitchen.. 1-2 Q Hs As Needed Insomnia 10)  Maxifed-G Cdx 40-20-400 Mg Tabs (Pseudoephedrine-Codeine-Gg) .Marland Kitchen.. 1 By Mouth Two Times A Day As Needed Cough 11)  Pristiq 50 Mg  Tb24 (Desvenlafaxine Succinate) .Marland Kitchen.. 1 By Mouth Daily 12)  Tramadol Hcl 50 Mg Tabs (Tramadol Hcl) .Marland Kitchen.. 1-2 Tabs By Mouth Two Times A Day As Needed Pain 13)  Hydrocodone-Acetaminophen 10-325 Mg Tabs (Hydrocodone-Acetaminophen) .Marland Kitchen.. 1 By Mouth Qid As Needed  Pain  Allergies (verified): 1)  Prozac 2)  * Lexapro 3)  Doxycycline 4)  Cymbalta (Duloxetine Hcl) 5)  Pristiq (Desvenlafaxine Succinate)  Past History:  Past Medical History: Last updated: 05/01/2009 GERD (ICD-530.81) OSTEOPENIA (ICD-733.90) ARTHRITIS (ICD-716.90) Hx of HYPERGLYCEMIA (ICD-790.29) HYPERLIPIDEMIA (ICD-272.4) DEPRESSION (ICD-311) COPD (ICD-496) POSS GLUTEN ALLERGY  Anxiety  Social History: Last updated: 01/02/2008 Married Current Smoker Alcohol use-no Regular exercise-no  Family History: Reviewed history from 09/05/2007 and no changes required. Family History Hypertension  Social History: Reviewed history from 01/02/2008 and no changes required. Married Current Smoker Alcohol use-no Regular exercise-no  Review of Systems       The patient complains of dyspnea on exertion and prolonged cough.  The patient denies fever, chest pain, and abdominal pain.    Physical Exam  General:  NAD Nose:  External nasal examination shows no deformity or inflammation. Nasal mucosa are eryth and moist without lesions or exudates. Mouth:  Clear eryth throat w/o suspicious lesions Lungs:  B rhonchi Heart:  Normal rate and regular rhythm. S1 and S2 normal without gallop, murmur, click, rub or other extra sounds. Abdomen:  Bowel sounds positive,abdomen soft and non-tender without masses, organomegaly or hernias noted. Msk:  No deformity or scoliosis noted of thoracic or lumbar spine.  Lumbar-sacral spine is tender to palpation over paraspinal muscles and  painfull with the ROM  Extremities:  No edema Neurologic:  No cranial nerve deficits noted. Station and gait are normal. Plantar reflexes are down-going bilaterally. DTRs are symmetrical throughout. Sensory, motor and coordinative functions appear intact. Skin:  Intact without suspicious lesions or rashes Psych:  Oriented X3 and  less depressed affect.     Impression & Recommendations:  Problem # 1:  ANXIETY  (ICD-300.00) Assessment Unchanged  The following medications were removed from the medication list:    Xanax 0.5 Mg Tabs (Alprazolam) .Marland Kitchen... 1/2 or 1 by mouth three times a day as needed anxiety    Pristiq 50 Mg Tb24 (Desvenlafaxine succinate) .Marland Kitchen... 1 by mouth daily Her updated medication list for this problem includes:    Alprazolam 1 Mg Tabs (Alprazolam) .Marland Kitchen... 1/2 -1 tab by mouth three times a day as needed anxiety    Wellbutrin Sr 150 Mg Xr12h-tab (Bupropion hcl) .Marland Kitchen... 1 by mouth q am and q lunch (two times a day)  Problem # 2:  DEPRESSION (ICD-311) Assessment: Deteriorated  The following medications were removed from the medication list:    Xanax 0.5 Mg Tabs (Alprazolam) .Marland Kitchen... 1/2 or 1 by mouth three times a day as needed anxiety    Pristiq 50 Mg Tb24 (Desvenlafaxine succinate) .Marland Kitchen... 1 by mouth daily Her updated medication list for this problem includes:    Alprazolam 1 Mg Tabs (Alprazolam) .Marland Kitchen... 1/2 -1 tab by mouth three times a day as needed anxiety    Wellbutrin Sr 150 Mg Xr12h-tab (Bupropion hcl) .Marland Kitchen... 1 by mouth q am and q lunch (two times a day)  Problem # 3:  GERD (ICD-530.81) Assessment: Improved  Problem # 4:  COPD (ICD-496) Assessment: Unchanged  Her updated medication list for this problem includes:    Advair Diskus 250-50 Mcg/dose Misc (Fluticasone-salmeterol) ..... Use twice daily    Spiriva Handihaler 18 Mcg Caps (Tiotropium bromide monohydrate) ..... Use daily as directed    Proair Hfa 108 (90 Base) Mcg/act Aers (Albuterol sulfate) .Marland Kitchen... 2 inh q4h as needed shortness of breath  Problem # 5:  TOBACCO USE DISORDER/SMOKER-SMOKING CESSATION DISCUSSED (ICD-305.1) Assessment: Improved  Encouraged smoking cessation and discussed different methods for smoking cessation.   Complete Medication List: 1)  Advair Diskus 250-50 Mcg/dose Misc (Fluticasone-salmeterol) .... Use twice daily 2)  Spiriva Handihaler 18 Mcg Caps (Tiotropium bromide monohydrate) .... Use daily as  directed 3)  Omega-3 350 Mg Caps (Omega-3 fatty acids) .... Take one tablet once daily 4)  Proair Hfa 108 (90 Base) Mcg/act Aers (Albuterol sulfate) .... 2 inh q4h as needed shortness of breath 5)  Diethylpropion Hcl 25 Mg Tabs (Diethylpropion hcl) .Marland Kitchen.. 1 by mouth three times a day as needed please,  fill on or after       02/10/10     . ok to fill 1-2 day earlier when the month has 31 days in it. 6)  Premarin 0.625 Mg Tabs (Estrogens conjugated) .... Take 1 by mouth qd 7)  Vitamin D3 1000 Unit Tabs (Cholecalciferol) .... Take 2 tablets once daily 8)  Temazepam 15 Mg Caps (Temazepam) .Marland Kitchen.. 1-2 q hs as needed insomnia 9)  Maxifed-g Cdx 40-20-400 Mg Tabs (Pseudoephedrine-codeine-gg) .Marland Kitchen.. 1 by mouth two times a day as needed cough 10)  Tramadol Hcl 50 Mg Tabs (Tramadol hcl) .Marland Kitchen.. 1-2 tabs by mouth two times a day as needed pain 11)  Hydrocodone-acetaminophen 10-325 Mg Tabs (Hydrocodone-acetaminophen) .Marland Kitchen.. 1 by mouth qid as needed pain 12)  Alprazolam 1 Mg Tabs (Alprazolam) .... 1/2 -  1 tab by mouth three times a day as needed anxiety 13)  Wellbutrin Sr 150 Mg Xr12h-tab (Bupropion hcl) .Marland Kitchen.. 1 by mouth q am and q lunch (two times a day)  Patient Instructions: 1)  Please schedule a follow-up appointment in 3 months. Prescriptions: WELLBUTRIN SR 150 MG XR12H-TAB (BUPROPION HCL) 1 by mouth q am and q lunch (two times a day)  #60 x 6   Entered and Authorized by:   Tresa Garter MD   Signed by:   Tresa Garter MD on 01/21/2010   Method used:   Print then Give to Patient   RxID:   [id number] DIETHYLPROPION HCL 25 MG  TABS (DIETHYLPROPION HCL) 1 by mouth three times a day as needed Please,  fill on or after       02/10/10     . OK to fill 1-2 day earlier when the month has 31 days in it.  #90 x 0   Entered and Authorized by:   Tresa Garter MD   Signed by:   Tresa Garter MD on 01/21/2010   Method used:   Print then Give to Patient   RxID:   [id number] ALPRAZOLAM 1 MG TABS  (ALPRAZOLAM) 1/2 -1 tab by mouth three times a day as needed anxiety  #90 x 6   Entered and Authorized by:   Tresa Garter MD   Signed by:   Tresa Garter MD on 01/21/2010   Method used:   Print then Give to Patient   RxID:   [id number]

## 2010-11-11 NOTE — Assessment & Plan Note (Signed)
Summary: 4 mos f/u / # / cd   Vital Signs:  Patient profile:   65 year old female Height:      63 inches Weight:      149 pounds BMI:     26.49 O2 Sat:      98 % on Room air Temp:     97.8 degrees F oral Pulse rate:   82 / minute Pulse rhythm:   regular Resp:     16 per minute BP sitting:   110 / 78  (left arm) Cuff size:   regular  Vitals Entered By: Lanier Prude, CMA(AAMA) (May 26, 2010 1:25 PM)  O2 Flow:  Room air CC: f/u Is Patient Diabetic? No   CC:  f/u.  History of Present Illness: The patient presents for a follow up of neck/back pain, anxiety, depression  OA is much better on Vimovo   Current Medications (verified): 1)  Advair Diskus 250-50 Mcg/dose  Misc (Fluticasone-Salmeterol) .... Use Twice Daily 2)  Spiriva Handihaler 18 Mcg  Caps (Tiotropium Bromide Monohydrate) .... Use Daily As Directed 3)  Omega-3 350 Mg  Caps (Omega-3 Fatty Acids) .... Take One Tablet Once Daily 4)  Proair Hfa 108 (90 Base) Mcg/act  Aers (Albuterol Sulfate) .... 2 Inh Q4h As Needed Shortness of Breath 5)  Diethylpropion Hcl 25 Mg  Tabs (Diethylpropion Hcl) .Marland Kitchen.. 1 By Mouth Three Times A Day As Needed Please,  Fill On or After       02/10/10     . Ok To Fill 1-2 Day Earlier When The Month Has 31 Days in It. 6)  Premarin 0.625 Mg  Tabs (Estrogens Conjugated) .... Take 1 By Mouth Qd 7)  Vitamin D3 1000 Unit  Tabs (Cholecalciferol) .... Take 2 Tablets Once Daily 8)  Temazepam 15 Mg Caps (Temazepam) .Marland Kitchen.. 1-2 Q Hs As Needed Insomnia 9)  Maxifed-G Cdx 40-20-400 Mg Tabs (Pseudoephedrine-Codeine-Gg) .Marland Kitchen.. 1 By Mouth Two Times A Day As Needed Cough 10)  Tramadol Hcl 50 Mg Tabs (Tramadol Hcl) .Marland Kitchen.. 1-2 Tabs By Mouth Two Times A Day As Needed Pain 11)  Hydrocodone-Acetaminophen 10-325 Mg Tabs (Hydrocodone-Acetaminophen) .Marland Kitchen.. 1 By Mouth Qid As Needed Pain 12)  Alprazolam 1 Mg Tabs (Alprazolam) .... 1/2 -1 Tab By Mouth Three Times A Day As Needed Anxiety 13)  Wellbutrin Sr 150 Mg Xr12h-Tab (Bupropion  Hcl) .Marland Kitchen.. 1 By Mouth Q Am and Q Lunch (Two Times A Day) 14)  Vimovo 500-20 Mg Tbec (Naproxen-Esomeprazole) .Marland Kitchen.. 1 By Mouth Once Daily - Two Times A Day Pc As Needed Pain  Allergies (verified): 1)  Prozac 2)  * Lexapro 3)  Doxycycline 4)  Cymbalta (Duloxetine Hcl) 5)  Pristiq (Desvenlafaxine Succinate)  Past History:  Social History: Last updated: 01/02/2008 Married Current Smoker Alcohol use-no Regular exercise-no  Past Medical History: GERD (ICD-530.81) OSTEOPENIA (ICD-733.90) ARTHRITIS (ICD-716.90) Hx of HYPERGLYCEMIA (ICD-790.29) HYPERLIPIDEMIA (ICD-272.4) DEPRESSION (ICD-311) COPD (ICD-496) POSS GLUTEN ALLERGY  Anxiety Osteopenia  Review of Systems       The patient complains of dyspnea on exertion.  The patient denies fever, abdominal pain, melena, and hematochezia.    Physical Exam  General:  NAD Nose:  External nasal examination shows no deformity or inflammation. Nasal mucosa are eryth and moist without lesions or exudates. Mouth:  Clear eryth throat w/o suspicious lesions Neck:  No deformities, masses, or tenderness noted. Lungs:  B rhonchi Heart:  Normal rate and regular rhythm. S1 and S2 normal without gallop, murmur, click, rub or other extra sounds.  Abdomen:  Bowel sounds positive,abdomen soft and non-tender without masses, organomegaly or hernias noted. Msk:  No deformity or scoliosis noted of thoracic or lumbar spine.  Lumbar-sacral spine is tender to palpation over paraspinal muscles and painfull with the ROM  Skin:  Intact without suspicious lesions or rashes Psych:  Oriented X3 and  less depressed affect.     Impression & Recommendations:  Problem # 1:  GERD (ICD-530.81) Assessment Improved as per #2  Problem # 2:  ARTHRITIS (ICD-716.90) Assessment: Improved Cont Vimovo  Problem # 3:  ANXIETY (ICD-300.00) Assessment: Improved  Her updated medication list for this problem includes:    Alprazolam 1 Mg Tabs (Alprazolam) .Marland Kitchen... 1/2 -1 tab  by mouth three times a day as needed anxiety    Wellbutrin Sr 150 Mg Xr12h-tab (Bupropion hcl) .Marland Kitchen... 1 by mouth q am and q lunch (two times a day)  Problem # 4:  DEPRESSION (ICD-311) Assessment: Improved  Her updated medication list for this problem includes:    Alprazolam 1 Mg Tabs (Alprazolam) .Marland Kitchen... 1/2 -1 tab by mouth three times a day as needed anxiety    Wellbutrin Sr 150 Mg Xr12h-tab (Bupropion hcl) .Marland Kitchen... 1 by mouth q am and q lunch (two times a day)  Problem # 5:  OSTEOPENIA (ICD-733.90) Assessment: Comment Only Vit D Rx options discussed Orders: T-Bone Densitometry (83151)  Problem # 6:  COPD (ICD-496) Assessment: Unchanged  Her updated medication list for this problem includes:    Advair Diskus 250-50 Mcg/dose Misc (Fluticasone-salmeterol) ..... Use twice daily    Spiriva Handihaler 18 Mcg Caps (Tiotropium bromide monohydrate) ..... Use daily as directed    Proair Hfa 108 (90 Base) Mcg/act Aers (Albuterol sulfate) .Marland Kitchen... 2 inh q4h as needed shortness of breath  Problem # 7:  TOBACCO USE DISORDER/SMOKER-SMOKING CESSATION DISCUSSED (ICD-305.1) Assessment: Unchanged  Encouraged smoking cessation and discussed different methods for smoking cessation.   Complete Medication List: 1)  Advair Diskus 250-50 Mcg/dose Misc (Fluticasone-salmeterol) .... Use twice daily 2)  Spiriva Handihaler 18 Mcg Caps (Tiotropium bromide monohydrate) .... Use daily as directed 3)  Omega-3 350 Mg Caps (Omega-3 fatty acids) .... Take one tablet once daily 4)  Proair Hfa 108 (90 Base) Mcg/act Aers (Albuterol sulfate) .... 2 inh q4h as needed shortness of breath 5)  Diethylpropion Hcl 25 Mg Tabs (Diethylpropion hcl) .Marland Kitchen.. 1 by mouth three times a day as needed please,  fill on or after       02/10/10     . ok to fill 1-2 day earlier when the month has 31 days in it. 6)  Premarin 0.625 Mg Tabs (Estrogens conjugated) .... Take 1 by mouth qd 7)  Vitamin D3 1000 Unit Tabs (Cholecalciferol) .... Take 2 tablets  once daily 8)  Temazepam 15 Mg Caps (Temazepam) .Marland Kitchen.. 1-2 q hs as needed insomnia 9)  Maxifed-g Cdx 40-20-400 Mg Tabs (Pseudoephedrine-codeine-gg) .Marland Kitchen.. 1 by mouth two times a day as needed cough 10)  Tramadol Hcl 50 Mg Tabs (Tramadol hcl) .Marland Kitchen.. 1-2 tabs by mouth two times a day as needed pain 11)  Hydrocodone-acetaminophen 10-325 Mg Tabs (Hydrocodone-acetaminophen) .Marland Kitchen.. 1 by mouth qid as needed pain 12)  Alprazolam 1 Mg Tabs (Alprazolam) .... 1/2 -1 tab by mouth three times a day as needed anxiety 13)  Wellbutrin Sr 150 Mg Xr12h-tab (Bupropion hcl) .Marland Kitchen.. 1 by mouth q am and q lunch (two times a day) 14)  Vimovo 500-20 Mg Tbec (Naproxen-esomeprazole) .Marland Kitchen.. 1 by mouth once daily - two times a  day pc as needed pain  Patient Instructions: 1)  Please schedule a follow-up appointment in 3 months.

## 2010-11-11 NOTE — Assessment & Plan Note (Signed)
Summary: back lump  stc   Vital Signs:  Patient profile:   65 year old female Height:      63 inches Weight:      151 pounds BMI:     26.85 Temp:     98.4 degrees F oral Pulse rate:   72 / minute Pulse rhythm:   regular Resp:     16 per minute BP sitting:   120 / 70  (left arm) Cuff size:   regular  Vitals Entered By: Lanier Prude, Beverly Gust) (August 04, 2010 3:44 PM) CC: lump onlt side of back X 1 week Is Patient Diabetic? No   CC:  lump onlt side of back X 1 week.  History of Present Illness: F/u anxiety C/o mass on L chest wall x months, NT  Current Medications (verified): 1)  Advair Diskus 250-50 Mcg/dose  Misc (Fluticasone-Salmeterol) .... Use Twice Daily 2)  Spiriva Handihaler 18 Mcg  Caps (Tiotropium Bromide Monohydrate) .... Use Daily As Directed 3)  Omega-3 350 Mg  Caps (Omega-3 Fatty Acids) .... Take One Tablet Once Daily 4)  Proair Hfa 108 (90 Base) Mcg/act  Aers (Albuterol Sulfate) .... 2 Inh Q4h As Needed Shortness of Breath 5)  Diethylpropion Hcl 25 Mg  Tabs (Diethylpropion Hcl) .Marland Kitchen.. 1 By Mouth Three Times A Day As Needed Please,  Fill On or After       02/10/10     . Ok To Fill 1-2 Day Earlier When The Month Has 31 Days in It. 6)  Premarin 0.625 Mg  Tabs (Estrogens Conjugated) .... Take 1 By Mouth Qd 7)  Vitamin D3 1000 Unit  Tabs (Cholecalciferol) .... Take 2 Tablets Once Daily 8)  Temazepam 15 Mg Caps (Temazepam) .Marland Kitchen.. 1-2 Q Hs As Needed Insomnia 9)  Maxifed-G Cdx 40-20-400 Mg Tabs (Pseudoephedrine-Codeine-Gg) .Marland Kitchen.. 1 By Mouth Two Times A Day As Needed Cough 10)  Tramadol Hcl 50 Mg Tabs (Tramadol Hcl) .Marland Kitchen.. 1-2 Tabs By Mouth Two Times A Day As Needed Pain 11)  Hydrocodone-Acetaminophen 10-325 Mg Tabs (Hydrocodone-Acetaminophen) .Marland Kitchen.. 1 By Mouth Qid As Needed Pain 12)  Alprazolam 1 Mg Tabs (Alprazolam) .... 1/2 -1 Tab By Mouth Three Times A Day As Needed Anxiety 13)  Wellbutrin Sr 150 Mg Xr12h-Tab (Bupropion Hcl) .Marland Kitchen.. 1 By Mouth Q Am and Q Lunch (Two Times A  Day) 14)  Vimovo 500-20 Mg Tbec (Naproxen-Esomeprazole) .Marland Kitchen.. 1 By Mouth Once Daily - Two Times A Day Pc As Needed Pain  Allergies (verified): 1)  Prozac 2)  * Lexapro 3)  Doxycycline 4)  Cymbalta (Duloxetine Hcl) 5)  Pristiq (Desvenlafaxine Succinate)  Physical Exam  General:  Well-developed,well-nourished,in no acute distress; alert,appropriate and cooperative throughout examination Skin:  L chest wall posterolat subcutaneouse 3.0x2.5 cm NT   Impression & Recommendations:  Problem # 1:  LIPOMA (ICD-214.9) - chest wall Assessment [place] is c/w lipoma  Problem # 2:  ANXIETY (ICD-300.00) Assessment: Unchanged  Her updated medication list for this problem includes:    Alprazolam 1 Mg Tabs (Alprazolam) .Marland Kitchen... 1/2 -1 tab by mouth three times a day as needed anxiety    Wellbutrin Sr 150 Mg Xr12h-tab (Bupropion hcl) .Marland Kitchen... 1 by mouth q am and q lunch (two times a day)  Complete Medication List: 1)  Advair Diskus 250-50 Mcg/dose Misc (Fluticasone-salmeterol) .... Use twice daily 2)  Spiriva Handihaler 18 Mcg Caps (Tiotropium bromide monohydrate) .... Use daily as directed 3)  Omega-3 350 Mg Caps (Omega-3 fatty acids) .... Take one tablet once daily  4)  Proair Hfa 108 (90 Base) Mcg/act Aers (Albuterol sulfate) .... 2 inh q4h as needed shortness of breath 5)  Diethylpropion Hcl 25 Mg Tabs (Diethylpropion hcl) .Marland Kitchen.. 1 by mouth three times a day as needed  please,  fill on or after   09/04/10         . ok to fill 1-2 day earlier when the month has 31 days in it. 6)  Premarin 0.625 Mg Tabs (Estrogens conjugated) .... Take 1 by mouth qd 7)  Vitamin D3 1000 Unit Tabs (Cholecalciferol) .... Take 2 tablets once daily 8)  Temazepam 15 Mg Caps (Temazepam) .Marland Kitchen.. 1-2 q hs as needed insomnia 9)  Maxifed-g Cdx 40-20-400 Mg Tabs (Pseudoephedrine-codeine-gg) .Marland Kitchen.. 1 by mouth two times a day as needed cough 10)  Tramadol Hcl 50 Mg Tabs (Tramadol hcl) .Marland Kitchen.. 1-2 tabs by mouth two times a day as needed pain 11)   Hydrocodone-acetaminophen 10-325 Mg Tabs (Hydrocodone-acetaminophen) .Marland Kitchen.. 1 by mouth qid as needed pain 12)  Alprazolam 1 Mg Tabs (Alprazolam) .... 1/2 -1 tab by mouth three times a day as needed anxiety 13)  Wellbutrin Sr 150 Mg Xr12h-tab (Bupropion hcl) .Marland Kitchen.. 1 by mouth q am and q lunch (two times a day) 14)  Vimovo 500-20 Mg Tbec (Naproxen-esomeprazole) .Marland Kitchen.. 1 by mouth once daily - two times a day pc as needed pain  Patient Instructions: 1)  Please schedule a follow-up appointment in 4 months. Prescriptions: DIETHYLPROPION HCL 25 MG  TABS (DIETHYLPROPION HCL) 1 by mouth three times a day as needed  Please,  fill on or after   09/04/10         . OK to fill 1-2 day earlier when the month has 31 days in it.  #90 x 0   Entered and Authorized by:   Tresa Garter MD   Signed by:   Tresa Garter MD on 08/04/2010   Method used:   Print then Give to Patient   RxID:   [id number] DIETHYLPROPION HCL 25 MG  TABS (DIETHYLPROPION HCL) 1 by mouth three times a day as needed  #90 x 0   Entered and Authorized by:   Tresa Garter MD   Signed by:   Tresa Garter MD on 08/04/2010   Method used:   Print then Give to Patient   RxID:   [id number] DIETHYLPROPION HCL 25 MG  TABS (DIETHYLPROPION HCL) 1 by mouth three times a day as needed Please,  fill on or after       02/10/10     . OK to fill 1-2 day earlier when the month has 31 days in it.  #90 x 0   Entered and Authorized by:   Tresa Garter MD   Signed by:   Tresa Garter MD on 08/04/2010   Method used:   Print then Give to Patient   RxID:   [id number]    Orders Added: 1)  Est. Patient Level III [01027]

## 2010-11-11 NOTE — Progress Notes (Signed)
Summary: Refill--Temazepam  Phone Note Refill Request Message from:  Fax from Pharmacy on March 25, 2010 2:25 PM  Refills Requested: Medication #1:  TEMAZEPAM 15 MG CAPS 1-2 q hs as needed insomnia Next Appointment Scheduled: 05-26-10 Initial call taken by: Lucious Groves,  March 25, 2010 2:25 PM  Follow-up for Phone Call        ok 3 ref Follow-up by: Tresa Garter MD,  March 26, 2010 8:05 AM    Prescriptions: TEMAZEPAM 15 MG CAPS (TEMAZEPAM) 1-2 q hs as needed insomnia  #60 x 6   Entered by:   Lamar Sprinkles, CMA   Authorized by:   Tresa Garter MD   Signed by:   Lamar Sprinkles, CMA on 03/26/2010   Method used:   Telephoned to ...       [facility]  [phone]* (retail)       [address] [place], [place]  [address]       Ph: [phone] or [phone]       Fax: [phone]   RxID:   [id number]

## 2010-11-11 NOTE — Progress Notes (Signed)
Summary: Med refill  Phone Note Refill Request Message from:  Fax from Pharmacy on March 11, 2010 2:49 PM  Refills Requested: Medication #1:  DIETHYLPROPION HCL 25 MG  TABS 1 by mouth three times a day as needed Please Next Appointment Scheduled: 05-26-2010 Initial call taken by: Lucious Groves,  March 11, 2010 2:49 PM  Follow-up for Phone Call        ok to ref Follow-up by: Tresa Garter MD,  March 12, 2010 7:08 AM    Prescriptions: DIETHYLPROPION HCL 25 MG  TABS (DIETHYLPROPION HCL) 1 by mouth three times a day as needed Please,  fill on or after       02/10/10     . OK to fill 1-2 day earlier when the month has 31 days in it.  #90 x 0   Entered by:   Lamar Sprinkles, CMA   Authorized by:   Tresa Garter MD   Signed by:   Lamar Sprinkles, CMA on 03/12/2010   Method used:   Telephoned to ...       [facility]  [phone]* (retail)       [address] [place], [place]  [address]       Ph: [phone] or [phone]       Fax: [phone]   RxID:   [id number]

## 2010-11-11 NOTE — Assessment & Plan Note (Signed)
Summary: 2 MO ROV /NWS   Vital Signs:  Patient profile:   65 year old female Height:      63 inches Weight:      152 pounds BMI:     27.02 Temp:     98.3 degrees F oral Pulse rate:   88 / minute Pulse rhythm:   regular Resp:     16 per minute BP sitting:   142 / 82  (left arm) Cuff size:   regular  Vitals Entered By: Lanier Prude, CMA(AAMA) (October 13, 2010 1:21 PM) CC: 2 mo f/u  Is Patient Diabetic? No   CC:  2 mo f/u .  History of Present Illness: The patient presents for a follow up of OA, depression, anxiety   Current Medications (verified): 1)  Advair Diskus 250-50 Mcg/dose  Misc (Fluticasone-Salmeterol) .... Use Twice Daily 2)  Spiriva Handihaler 18 Mcg  Caps (Tiotropium Bromide Monohydrate) .... Use Daily As Directed 3)  Omega-3 350 Mg  Caps (Omega-3 Fatty Acids) .... Take One Tablet Once Daily 4)  Proair Hfa 108 (90 Base) Mcg/act  Aers (Albuterol Sulfate) .... 2 Inh Q4h As Needed Shortness of Breath 5)  Diethylpropion Hcl 25 Mg  Tabs (Diethylpropion Hcl) .Marland Kitchen.. 1 By Mouth Three Times A Day As Needed  Please,  Fill On or After   09/04/10         . Ok To Fill 1-2 Day Earlier When The Month Has 31 Days in It. 6)  Premarin 0.625 Mg  Tabs (Estrogens Conjugated) .... Take 1 By Mouth Qd 7)  Vitamin D3 1000 Unit  Tabs (Cholecalciferol) .... Take 2 Tablets Once Daily 8)  Temazepam 15 Mg Caps (Temazepam) .Marland Kitchen.. 1-2 Q Hs As Needed Insomnia 9)  Maxifed-G Cdx 40-20-400 Mg Tabs (Pseudoephedrine-Codeine-Gg) .Marland Kitchen.. 1 By Mouth Two Times A Day As Needed Cough 10)  Alprazolam 1 Mg Tabs (Alprazolam) .... 1/2 -1 Tab By Mouth Three Times A Day As Needed Anxiety 11)  Wellbutrin Sr 150 Mg Xr12h-Tab (Bupropion Hcl) .Marland Kitchen.. 1 By Mouth Q Am and Q Lunch (Two Times A Day) 12)  Percocet 10-325 Mg Tabs (Oxycodone-Acetaminophen) .Marland Kitchen.. 1 By Mouth Qd-Tid As Needed Pain  Allergies (verified): 1)  ! Vimovo (Naproxen-Esomeprazole) 2)  Prozac 3)  * Lexapro 4)  Doxycycline 5)  Cymbalta (Duloxetine Hcl) 6)   Pristiq (Desvenlafaxine Succinate)  Past History:  Past Medical History: Last updated: 08/25/2010 GERD (ICD-530.81) OSTEOPENIA (ICD-733.90) ARTHRITIS (ICD-716.90) Hx of HYPERGLYCEMIA (ICD-790.29) HYPERLIPIDEMIA (ICD-272.4) DEPRESSION (ICD-311) COPD (ICD-496) POSS GLUTEN ALLERGY  Anxiety Osteopenia Neck pain Low back pain Paresth R 1-2-3 fingers post CTS surgery  Social History: Last updated: 01/02/2008 Married Current Smoker Alcohol use-no Regular exercise-no  Review of Systems  The patient denies abdominal pain, muscle weakness, difficulty walking, depression, chest pain, and dyspnea on exertion.         OA pains  Physical Exam  General:  Well-developed,well-nourished,in no acute distress; alert,appropriate and cooperative throughout examination Nose:  External nasal examination shows no deformity or inflammation. Nasal mucosa are eryth and moist without lesions or exudates. Mouth:  Clear eryth throat w/o suspicious lesions Abdomen:  Bowel sounds positive,abdomen soft and non-tender without masses, organomegaly or hernias noted. Msk:  No deformity or scoliosis noted of thoracic or lumbar spine.  Lumbar-sacral spine is tender to palpation over paraspinal muscles and painfull with the ROM  Neurologic:  No cranial nerve deficits noted. Station and gait are normal. Plantar reflexes are down-going bilaterally. DTRs are symmetrical throughout. Sensory, motor and  coordinative functions appear intact. Skin:  L chest wall posterolat subcutaneouse 3.0x2.5 cm NT Psych:  Oriented X3 and  less depressed affect.     Impression & Recommendations:  Problem # 1:  LOW BACK PAIN (ICD-724.2) OA/MSK Assessment New  Her updated medication list for this problem includes:    Percocet 10-325 Mg Tabs (Oxycodone-acetaminophen) .Marland Kitchen... 1 by mouth qd-tid as needed pain(she filled 30 tabs due to $$)  Problem # 2:  NECK PAIN (ICD-723.1) - MSK work related Assessment: New  Her updated  medication list for this problem includes:    Percocet 10-325 Mg Tabs (Oxycodone-acetaminophen) .Marland Kitchen... 1 by mouth qd-tid as needed pain  Problem # 3:  ANXIETY (ICD-300.00) Assessment: Improved  Her updated medication list for this problem includes:    Alprazolam 1 Mg Tabs (Alprazolam) .Marland Kitchen... 1/2 -1 tab by mouth three times a day as needed anxiety    Wellbutrin Sr 150 Mg Xr12h-tab (Bupropion hcl) .Marland Kitchen... 1 by mouth q am and q lunch (two times a day)  Problem # 4:  ARTHRITIS (ICD-716.90) Assessment: Unchanged as above  Problem # 5:  GERD (ICD-530.81) Assessment: Improved  Problem # 6:  COPD (ICD-496) Assessment: Unchanged  Her updated medication list for this problem includes:    Advair Diskus 250-50 Mcg/dose Misc (Fluticasone-salmeterol) ..... Use twice daily    Spiriva Handihaler 18 Mcg Caps (Tiotropium bromide monohydrate) ..... Use daily as directed    Proair Hfa 108 (90 Base) Mcg/act Aers (Albuterol sulfate) .Marland Kitchen... 2 inh q4h as needed shortness of breath  Complete Medication List: 1)  Advair Diskus 250-50 Mcg/dose Misc (Fluticasone-salmeterol) .... Use twice daily 2)  Spiriva Handihaler 18 Mcg Caps (Tiotropium bromide monohydrate) .... Use daily as directed 3)  Omega-3 350 Mg Caps (Omega-3 fatty acids) .... Take one tablet once daily 4)  Proair Hfa 108 (90 Base) Mcg/act Aers (Albuterol sulfate) .... 2 inh q4h as needed shortness of breath 5)  Diethylpropion Hcl 25 Mg Tabs (Diethylpropion hcl) .Marland Kitchen.. 1 by mouth three times a day as needed  please,  fill on or after   09/04/10         . ok to fill 1-2 day earlier when the month has 31 days in it. 6)  Premarin 0.625 Mg Tabs (Estrogens conjugated) .... Take 1 by mouth qd 7)  Vitamin D3 1000 Unit Tabs (Cholecalciferol) .... Take 2 tablets once daily 8)  Temazepam 15 Mg Caps (Temazepam) .Marland Kitchen.. 1-2 q hs as needed insomnia 9)  Maxifed-g Cdx 40-20-400 Mg Tabs (Pseudoephedrine-codeine-gg) .Marland Kitchen.. 1 by mouth two times a day as needed cough 10)   Alprazolam 1 Mg Tabs (Alprazolam) .... 1/2 -1 tab by mouth three times a day as needed anxiety 11)  Wellbutrin Sr 150 Mg Xr12h-tab (Bupropion hcl) .Marland Kitchen.. 1 by mouth q am and q lunch (two times a day) 12)  Percocet 10-325 Mg Tabs (Oxycodone-acetaminophen) .Marland Kitchen.. 1 by mouth qd-tid as needed pain  Patient Instructions: 1)  Please schedule a follow-up appointment in 2 months. Prescriptions: PERCOCET 10-325 MG TABS (OXYCODONE-ACETAMINOPHEN) 1 by mouth qd-tid as needed pain Brand medically necessary #100 x 0   Entered and Authorized by:   Tresa Garter MD   Signed by:   Tresa Garter MD on 10/13/2010   Method used:   Print then Give to Patient   RxID:   [id number] WELLBUTRIN SR 150 MG XR12H-TAB (BUPROPION HCL) 1 by mouth q am and q lunch (two times a day)  #180 x 1   Entered and  Authorized by:   Tresa Garter MD   Signed by:   Tresa Garter MD on 10/13/2010   Method used:   Print then Give to Patient   RxID:   [id number] ALPRAZOLAM 1 MG TABS (ALPRAZOLAM) 1/2 -1 tab by mouth three times a day as needed anxiety  #270 x 1   Entered and Authorized by:   Tresa Garter MD   Signed by:   Tresa Garter MD on 10/13/2010   Method used:   Print then Give to Patient   RxID:   [id number] TEMAZEPAM 15 MG CAPS (TEMAZEPAM) 1-2 q hs as needed insomnia  #180 x 1   Entered and Authorized by:   Tresa Garter MD   Signed by:   Tresa Garter MD on 10/13/2010   Method used:   Print then Give to Patient   RxID:   [id number] PREMARIN 0.625 MG  TABS (ESTROGENS CONJUGATED) TAKE 1 by mouth QD  #90 x 3   Entered and Authorized by:   Tresa Garter MD   Signed by:   Tresa Garter MD on 10/13/2010   Method used:   Print then Give to Patient   RxID:   [id number] DIETHYLPROPION HCL 25 MG  TABS (DIETHYLPROPION HCL) 1 by mouth three times a day as needed  Please,  fill on or after   09/04/10         . OK to fill 1-2 day earlier when the  month has 31 days in it.  #90 x 0   Entered and Authorized by:   Tresa Garter MD   Signed by:   Tresa Garter MD on 10/13/2010   Method used:   Print then Give to Patient   RxID:   [id number]    Orders Added: 1)  Est. Patient Level IV [27062]    Influenza Vaccine (to be given today)

## 2010-11-11 NOTE — Assessment & Plan Note (Signed)
Summary: FOOT PAIN/NWS   Vital Signs:  Patient profile:   65 year old female Height:      65 inches Weight:      148 pounds Temp:     97.3 degrees F oral Pulse rate:   80 / minute Pulse rhythm:   regular Resp:     16 per minute BP sitting:   120 / 80  (left arm) Cuff size:   regular  Vitals Entered By: Lanier Prude, CMA(AAMA) (May 05, 2010 8:16 AM) CC: c/o Rt foot pain X 6 wks Is Patient Diabetic? No Comments pt will be changing to Medicare in 2 months and is requesting labs to be ordered  if needed.   CC:  c/o Rt foot pain X 6 wks.  History of Present Illness: C/o R arm numbness x months  C/o R neck pain C/o intol of loud noise x 1 year - gets irritated C/o constipation x years F/u depression, anxiety C/o GERD and trouble swallowing x x months or years  Current Medications (verified): 1)  Advair Diskus 250-50 Mcg/dose  Misc (Fluticasone-Salmeterol) .... Use Twice Daily 2)  Spiriva Handihaler 18 Mcg  Caps (Tiotropium Bromide Monohydrate) .... Use Daily As Directed 3)  Omega-3 350 Mg  Caps (Omega-3 Fatty Acids) .... Take One Tablet Once Daily 4)  Proair Hfa 108 (90 Base) Mcg/act  Aers (Albuterol Sulfate) .... 2 Inh Q4h As Needed Shortness of Breath 5)  Diethylpropion Hcl 25 Mg  Tabs (Diethylpropion Hcl) .Marland Kitchen.. 1 By Mouth Three Times A Day As Needed Please,  Fill On or After       02/10/10     . Ok To Fill 1-2 Day Earlier When The Month Has 31 Days in It. 6)  Premarin 0.625 Mg  Tabs (Estrogens Conjugated) .... Take 1 By Mouth Qd 7)  Vitamin D3 1000 Unit  Tabs (Cholecalciferol) .... Take 2 Tablets Once Daily 8)  Temazepam 15 Mg Caps (Temazepam) .Marland Kitchen.. 1-2 Q Hs As Needed Insomnia 9)  Maxifed-G Cdx 40-20-400 Mg Tabs (Pseudoephedrine-Codeine-Gg) .Marland Kitchen.. 1 By Mouth Two Times A Day As Needed Cough 10)  Tramadol Hcl 50 Mg Tabs (Tramadol Hcl) .Marland Kitchen.. 1-2 Tabs By Mouth Two Times A Day As Needed Pain 11)  Hydrocodone-Acetaminophen 10-325 Mg Tabs (Hydrocodone-Acetaminophen) .Marland Kitchen.. 1 By Mouth Qid As  Needed Pain 12)  Alprazolam 1 Mg Tabs (Alprazolam) .... 1/2 -1 Tab By Mouth Three Times A Day As Needed Anxiety 13)  Wellbutrin Sr 150 Mg Xr12h-Tab (Bupropion Hcl) .Marland Kitchen.. 1 By Mouth Q Am and Q Lunch (Two Times A Day)  Allergies (verified): 1)  Prozac 2)  * Lexapro 3)  Doxycycline 4)  Cymbalta (Duloxetine Hcl) 5)  Pristiq (Desvenlafaxine Succinate)  Past History:  Past Medical History: Last updated: 05/01/2009 GERD (ICD-530.81) OSTEOPENIA (ICD-733.90) ARTHRITIS (ICD-716.90) Hx of HYPERGLYCEMIA (ICD-790.29) HYPERLIPIDEMIA (ICD-272.4) DEPRESSION (ICD-311) COPD (ICD-496) POSS GLUTEN ALLERGY  Anxiety  Past Surgical History: Last updated: 08/08/2007 HYSTERECTOMY, HX OF (ICD-V45.77) * BILATERAL ELBOW SURGERY CARPAL TUNNEL RELEASE, BILATERAL, HX OF (ICD-V45.89)  Family History: Last updated: 09/05/2007 Family History Hypertension  Social History: Last updated: 01/02/2008 Married Current Smoker Alcohol use-no Regular exercise-no  Review of Systems       The patient complains of dyspnea on exertion, peripheral edema, abdominal pain, and depression.  The patient denies chest pain and syncope.    Physical Exam  General:  NAD Head:  Normocephalic and atraumatic without obvious abnormalities. No apparent alopecia or balding. Nose:  External nasal examination shows no deformity or inflammation.  Nasal mucosa are eryth and moist without lesions or exudates. Mouth:  Clear eryth throat w/o suspicious lesions Neck:  No deformities, masses, or tenderness noted. Lungs:  B rhonchi Heart:  Normal rate and regular rhythm. S1 and S2 normal without gallop, murmur, click, rub or other extra sounds. Abdomen:  Bowel sounds positive,abdomen soft and non-tender without masses, organomegaly or hernias noted. Msk:  No deformity or scoliosis noted of thoracic or lumbar spine.  Lumbar-sacral spine is tender to palpation over paraspinal muscles and painfull with the ROM  Extremities:  No  edema Neurologic:  No cranial nerve deficits noted. Station and gait are normal. Plantar reflexes are down-going bilaterally. DTRs are symmetrical throughout. Sensory, motor and coordinative functions appear intact. Skin:  Intact without suspicious lesions or rashes Psych:  Oriented X3 and  less depressed affect.     Impression & Recommendations:  Problem # 1:  GERD (ICD-530.81) Assessment Deteriorated Vimovo samples Orders: Gastroenterology Referral (GI)  Problem # 2:  DYSPHAGIA UNSPECIFIED (ZOX-096.04) Assessment: New May need an EGD Orders: Gastroenterology Referral (GI) TLB-H. Pylori Abs(Helicobacter Pylori) (86677-HELICO)  Problem # 3:  ARTHRITIS (ICD-716.90) Assessment: Deteriorated Vimovo samples  Problem # 4:  PARESTHESIA (ICD-782.0) Assessment: New  Orders: T-Vitamin D (25-Hydroxy) (54098-11914) TLB-B12, Serum-Total ONLY (78295-A21) T-Cervicle Spine 2-3 Views (72040TC)  Problem # 5:  ANXIETY (ICD-300.00) and stress Assessment: Deteriorated  Her updated medication list for this problem includes:    Alprazolam 1 Mg Tabs (Alprazolam) .Marland Kitchen... 1/2 -1 tab by mouth three times a day as needed anxiety    Wellbutrin Sr 150 Mg Xr12h-tab (Bupropion hcl) .Marland Kitchen... 1 by mouth q am and q lunch (two times a day)  Problem # 6:  TOBACCO USE DISORDER/SMOKER-SMOKING CESSATION DISCUSSED (ICD-305.1) Assessment: Unchanged  Encouraged smoking cessation and discussed different methods for smoking cessation.   Complete Medication List: 1)  Advair Diskus 250-50 Mcg/dose Misc (Fluticasone-salmeterol) .... Use twice daily 2)  Spiriva Handihaler 18 Mcg Caps (Tiotropium bromide monohydrate) .... Use daily as directed 3)  Omega-3 350 Mg Caps (Omega-3 fatty acids) .... Take one tablet once daily 4)  Proair Hfa 108 (90 Base) Mcg/act Aers (Albuterol sulfate) .... 2 inh q4h as needed shortness of breath 5)  Diethylpropion Hcl 25 Mg Tabs (Diethylpropion hcl) .Marland Kitchen.. 1 by mouth three times a day as  needed please,  fill on or after       02/10/10     . ok to fill 1-2 day earlier when the month has 31 days in it. 6)  Premarin 0.625 Mg Tabs (Estrogens conjugated) .... Take 1 by mouth qd 7)  Vitamin D3 1000 Unit Tabs (Cholecalciferol) .... Take 2 tablets once daily 8)  Temazepam 15 Mg Caps (Temazepam) .Marland Kitchen.. 1-2 q hs as needed insomnia 9)  Maxifed-g Cdx 40-20-400 Mg Tabs (Pseudoephedrine-codeine-gg) .Marland Kitchen.. 1 by mouth two times a day as needed cough 10)  Tramadol Hcl 50 Mg Tabs (Tramadol hcl) .Marland Kitchen.. 1-2 tabs by mouth two times a day as needed pain 11)  Hydrocodone-acetaminophen 10-325 Mg Tabs (Hydrocodone-acetaminophen) .Marland Kitchen.. 1 by mouth qid as needed pain 12)  Alprazolam 1 Mg Tabs (Alprazolam) .... 1/2 -1 tab by mouth three times a day as needed anxiety 13)  Wellbutrin Sr 150 Mg Xr12h-tab (Bupropion hcl) .Marland Kitchen.. 1 by mouth q am and q lunch (two times a day) 14)  Vimovo 500-20 Mg Tbec (Naproxen-esomeprazole) .Marland Kitchen.. 1 by mouth once daily - two times a day pc as needed pain  Other Orders: Pneumococcal Vaccine (30865) Admin 1st Vaccine (78469)  TLB-CBC Platelet - w/Differential (85025-CBCD) TLB-BMP (Basic Metabolic Panel-BMET) (80048-METABOL) TLB-Hepatic/Liver Function Pnl (80076-HEPATIC) TLB-Lipid Panel (80061-LIPID) TLB-TSH (Thyroid Stimulating Hormone) (84443-TSH) TLB-Udip ONLY (81003-UDIP)  Patient Instructions: 1)  Please schedule a follow-up appointment in 2 weeks. 2)  Use stretching and balance exercises that I have provided (15 min. or longer every day)  3)  PFTs here Prescriptions: VIMOVO 500-20 MG TBEC (NAPROXEN-ESOMEPRAZOLE) 1 by mouth once daily - two times a day pc as needed pain  #60 x 3   Entered and Authorized by:   Tresa Garter MD   Signed by:   Tresa Garter MD on 05/05/2010   Method used:   Print then Give to Patient   RxID:   [id number]    Immunizations Administered:  Pneumonia Vaccine:    Vaccine Type: Pneumovax    Site: left deltoid    Mfr: Merck     Dose: 0.5 ml    Route: IM    Given by: Lanier Prude, CMA(AAMA)    Exp. Date: 10/27/2011    Lot #: [id number]    VIS given: 05/07/96 version given May 05, 2010.

## 2010-11-11 NOTE — Letter (Signed)
Summary: New Patient letter  [facility]  [address] [place], [place] [address]   Phone: [phone]  Fax: [phone]       05/05/2010 MRN: [id number]  [facility] [address] [place], [place]  [address]  Dear Ms. [facility],  Welcome to the Gastroenterology Division at [facility].    You are scheduled to see Dr.  Arlyce Dice on 06-21-10 at 9am on the 3rd floor at [facility], [address]  We ask that you try to arrive at our office 15 minutes prior to your appointment time to allow for check-in.  We would like you to complete the enclosed self-administered evaluation form prior to your visit and bring it with you on the day of your appointment.  We will review it with you.  Also, please bring a complete list of all your medications or, if you prefer, bring the medication bottles and we will list them.  Please bring your insurance card so that we may make a copy of it.  If your insurance requires a referral to see a specialist, please bring your referral form from your primary care physician.  Co-payments are due at the time of your visit and may be paid by cash, check or credit card.     Your office visit will consist of a consult with your physician (includes a physical exam), any laboratory testing he/she may order, scheduling of any necessary diagnostic testing (e.g. x-ray, ultrasound, CT-scan), and scheduling of a procedure (e.g. Endoscopy, Colonoscopy) if required.  Please allow enough time on your schedule to allow for any/all of these possibilities.    If you cannot keep your appointment, please call [phone] to cancel or reschedule prior to your appointment date.  This allows [place] the opportunity to schedule an appointment for another patient in need of care.  If you do not cancel or reschedule by 5 p.m. the business day prior to your appointment date, you will be charged a $50.00 late cancellation/no-show fee.    Thank you for choosing  [facility] for your medical needs.  We appreciate the opportunity to care for you.  Please visit [place] at our website  to learn more about our practice.                     Sincerely,                                                             The Gastroenterology Division

## 2010-11-15 ENCOUNTER — Telehealth: Payer: Self-pay | Admitting: Internal Medicine

## 2010-11-25 NOTE — Progress Notes (Signed)
Summary: Rf Diethylpropion  Phone Note Refill Request Message from:  Pharmacy  Refills Requested: Medication #1:  DIETHYLPROPION HCL 25 MG  TABS 1 by mouth three times a day as needed  Please   Dosage confirmed as above?Dosage Confirmed   Supply Requested: 90   Last Refilled: 10/14/2010  Method Requested: Electronic Initial call taken by: Lanier Prude, [facility]),  November 15, 2010 7:55 AM  Follow-up for Phone Call        ok to ref Follow-up by: Tresa Garter MD,  November 15, 2010 1:41 PM  Additional Follow-up for Phone Call Additional follow up Details #1::        Rx called to pharmacy Additional Follow-up by: Lanier Prude, [facility]),  November 15, 2010 5:13 PM    Prescriptions: DIETHYLPROPION HCL 25 MG  TABS (DIETHYLPROPION HCL) 1 by mouth three times a day as needed  Please,  fill on or after   09/04/10         . OK to fill 1-2 day earlier when the month has 31 days in it.  #90 x 0   Entered by:   Lanier Prude, CMA(AAMA)   Authorized by:   Tresa Garter MD   Signed by:   Lanier Prude, CMA(AAMA) on 11/15/2010   Method used:   Telephoned to ...       [facility] [address] [facility] [phone]* (retail)       [address]       [place], [place]  [address]       Ph: [phone]       Fax: [phone]   RxID:   [id number]

## 2010-12-10 ENCOUNTER — Encounter: Payer: Self-pay | Admitting: Internal Medicine

## 2010-12-16 NOTE — Miscellaneous (Signed)
Summary: mammogram 2012   Clinical Lists Changes  Observations: Added new observation of MAMMOGRAM: normal (10/28/2010 16:37)      Preventive Care Screening  Mammogram:    Date:  10/28/2010    Results:  normal 

## 2010-12-22 ENCOUNTER — Telehealth: Payer: Self-pay | Admitting: Internal Medicine

## 2010-12-28 NOTE — Progress Notes (Signed)
Summary: PERCOCET  Phone Note Call from Patient   Caller: Patient Summary of Call: Patient lmovm requesting a call back about meds.Alvy Beal Archie CMA  December 22, 2010 11:42 AM   Follow-up for Phone Call        WANTS THE GENERIC FOR PERCOCET.  DRUG STORE IS [facility] [id number]. Follow-up by: Hilarie Fredrickson,  December 23, 2010 9:48 AM  Additional Follow-up for Phone Call Additional follow up Details #1::        OK for refill?  Additional Follow-up by: Lamar Sprinkles, CMA,  December 23, 2010 10:31 AM    Additional Follow-up for Phone Call Additional follow up Details #2::    ok Follow-up by: Tresa Garter MD,  December 23, 2010 12:56 PM  Additional Follow-up for Phone Call Additional follow up Details #3:: Details for Additional Follow-up Action Taken: Pt aware that rx will be ready for pick up tomorrow Additional Follow-up by: Lamar Sprinkles, CMA,  December 23, 2010 6:06 PM  Prescriptions: PERCOCET 10-325 MG TABS (OXYCODONE-ACETAMINOPHEN) 1 by mouth qd-tid as needed pain Brand medically necessary #100 x 0   Entered by:   Lamar Sprinkles, CMA   Authorized by:   Tresa Garter MD   Signed by:   Lamar Sprinkles, CMA on 12/23/2010   Method used:   Print then Give to Patient   RxID:   [id number]

## 2010-12-29 ENCOUNTER — Other Ambulatory Visit: Payer: Self-pay | Admitting: Internal Medicine

## 2010-12-30 NOTE — Telephone Encounter (Signed)
Please advise if ok to refill. 

## 2010-12-31 ENCOUNTER — Telehealth: Payer: Self-pay | Admitting: *Deleted

## 2010-12-31 NOTE — Telephone Encounter (Signed)
Rf req for Diethylpropion 25mg  1 po tid # 90...Marland Kitchenok to Rf??   Last Rf  11/15/2010

## 2010-12-31 NOTE — Telephone Encounter (Signed)
Okay to refill thank you.

## 2011-01-03 MED ORDER — DIETHYLPROPION HCL 25 MG PO TABS: 1.0000 | ORAL_TABLET | Freq: Three times a day (TID) | ORAL | Status: DC

## 2011-01-03 NOTE — Telephone Encounter (Signed)
rf phoned on.

## 2011-01-05 NOTE — Telephone Encounter (Signed)
OK to fill this prescription with additional Rxs x2 next months Thank you!

## 2011-02-02 ENCOUNTER — Encounter: Payer: Self-pay | Admitting: Internal Medicine

## 2011-02-02 ENCOUNTER — Ambulatory Visit (INDEPENDENT_AMBULATORY_CARE_PROVIDER_SITE_OTHER): Payer: Medicare Other | Admitting: Internal Medicine

## 2011-02-02 DIAGNOSIS — J449 Chronic obstructive pulmonary disease, unspecified: Secondary | ICD-10-CM

## 2011-02-02 DIAGNOSIS — F329 Major depressive disorder, single episode, unspecified: Secondary | ICD-10-CM

## 2011-02-02 DIAGNOSIS — M542 Cervicalgia: Secondary | ICD-10-CM

## 2011-02-02 DIAGNOSIS — F3289 Other specified depressive episodes: Secondary | ICD-10-CM

## 2011-02-02 DIAGNOSIS — F411 Generalized anxiety disorder: Secondary | ICD-10-CM

## 2011-02-02 DIAGNOSIS — E785 Hyperlipidemia, unspecified: Secondary | ICD-10-CM

## 2011-02-02 DIAGNOSIS — J4489 Other specified chronic obstructive pulmonary disease: Secondary | ICD-10-CM

## 2011-02-02 MED ORDER — ROFLUMILAST 500 MCG PO TABS: 500.0000 ug | ORAL_TABLET | Freq: Every day | ORAL | Status: DC

## 2011-02-02 MED ORDER — OXYCODONE-ACETAMINOPHEN 10-325 MG PO TABS: 1.0000 | ORAL_TABLET | Freq: Four times a day (QID) | ORAL | Status: DC | PRN

## 2011-02-02 MED ORDER — DIETHYLPROPION HCL 25 MG PO TABS: 1.0000 | ORAL_TABLET | Freq: Three times a day (TID) | ORAL | Status: DC

## 2011-02-02 MED ORDER — ALPRAZOLAM 1 MG PO TABS: 1.0000 mg | ORAL_TABLET | Freq: Three times a day (TID) | ORAL | Status: DC | PRN

## 2011-02-02 NOTE — Assessment & Plan Note (Signed)
On Rx 

## 2011-02-02 NOTE — Assessment & Plan Note (Signed)
Better on meds 

## 2011-02-02 NOTE — Progress Notes (Signed)
  Subjective:    Patient ID: Theresa Aguirre, female    DOB: [birth year], 65 y.o.   MRN: [id number]  HPI   The patient is here to follow up on chronic depression, anxiety, headaches and chronic moderate fibromyalgia symptoms controlled with medicines, diet and exercise. C/o LBP   Review of Systems  Constitutional: Negative for chills.  HENT: Negative for congestion and neck pain.   Eyes: Negative for pain.  Respiratory: Negative for cough.   Cardiovascular: Negative for leg swelling.  Gastrointestinal: Positive for constipation.  Genitourinary: Negative for frequency.  Neurological: Negative for dizziness.  Hematological: Does not bruise/bleed easily.  Psychiatric/Behavioral: Negative for confusion and decreased concentration. The patient is nervous/anxious.        Objective:   Physical Exam  Constitutional: She appears well-developed and well-nourished. No distress.  HENT:  Head: Normocephalic.  Right Ear: External ear normal.  Left Ear: External ear normal.  Nose: Nose normal.  Mouth/Throat: Oropharynx is clear and moist.  Eyes: Conjunctivae are normal. Pupils are equal, round, and reactive to light. Right eye exhibits no discharge. Left eye exhibits no discharge.  Neck: Normal range of motion. Neck supple. No JVD present. No tracheal deviation present. No thyromegaly present.  Cardiovascular: Normal rate, regular rhythm and normal heart sounds.   Pulmonary/Chest: No stridor. No respiratory distress. She has no wheezes.  Abdominal: Soft. Bowel sounds are normal. She exhibits no distension and no mass. There is no tenderness. There is no rebound and no guarding.  Musculoskeletal: She exhibits tenderness (neck). She exhibits no edema.  Lymphadenopathy:    She has no cervical adenopathy.  Neurological: She displays normal reflexes. No cranial nerve deficit. She exhibits normal muscle tone. Coordination normal.  Skin: No rash noted. No erythema.  Psychiatric: She has a normal  mood and affect. Her behavior is normal. Judgment and thought content normal.          Assessment & Plan:  HYPERLIPIDEMIA On diet  DEPRESSION Better on meds  NECK PAIN On Rx  COPD On Rx  ANXIETY On Rx

## 2011-02-02 NOTE — Assessment & Plan Note (Signed)
  On diet  

## 2011-02-25 NOTE — Op Note (Signed)
[place]  Patient:    JAIME, DOME                      MRN: [id number] Proc. Date: 05/16/00 Adm. Date:  [id number] Attending:  Susa Day CC:         Sonda Primes, M.D. 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  OPERATION: 1. Release of left ulnar nerve at cubital tunnel. 2. Release of left median nerve at carpal tunnel.  OPERATING SURGEON:  Katy Fitch. Sypher, Montez Hageman., M.D.  ASSISTANT:  Marveen Reeks Dasnoit, P.A.-C.  ANESTHESIA:  General by LMA.  SUPERVISING ANESTHESIOLOGIST:  Judie Petit, M.D.  INDICATIONS:  Che Below is a 65 year old woman, who has had chronic hand numbness.  She developed profound thenar atrophy on the right and mild thenar atrophy on the left.  She was referred by Dr. Sonda Primes for evaluation and was noted to have severe carpal tunnel syndrome bilaterally and ulnar neuropathy at the cubital tunnel on the right and the left.  Due to failure to respond to nonoperative measures she is brought to the operating room at this time for release of her left transverse carpal ligament and left cubital tunnel.  DESCRIPTION OF PROCEDURE:  Ceri Mayer is brought to the operating room and placed in the supine position on the operating table.  Following induction of general anesthesia by LMA, the left arm was prepped with Betadine soap and solution and sterilely draped.  Following exsanguination of the limb with Esmarch bandage her tourniquet inflated to 220 mmHg.  The procedure  commenced with a short incision over the path of the ulnar nerve to the medial epicondyle.  Subcutaneous tissues were carefully divided taking care to identify and spare the posterior branches of the medial and antebrachial cutaneous nerve.  The ulnar nerve was identified deep to the triceps fascia which completely covered the nerve.  The triceps fascia was split proximally and the nerve decompressed to the level of the arcade of Struthers.  The arcuate ligament was released with scissors followed by release of the fascia at the head of flexor carpi ulnaris.  Deep to the flexor carpi ulnaris there are several fibrous bands that were lysed with a Therapist, nutritional.  Care was taken to preserve the vascular structures feeding the ulnar nerve at the elbow.  A portion of the triceps aponeurosis was dissected over the nerve down to the level of the main triceps tendon.  This fully decompressed the nerve. Thereafter the nerve was noted to be stable from a range of motion of 0 to 140 degrees of elbow flexion.  Bleeding points were electrocauterized with bipolar current, following repair of the skin with intradermal 3-0 Prolene and Steri-Strip.  Marcaine 0.25% without epinephrine was infiltrated along the wound margins for postoperative analgesia.  Attention was then directed to the palm where a short incision was fashioned in line of the ring finger and palm.  Subcutaneous tissues were carefully divided to reveal the palmar fascia.  This was split  longitudinally to reveal the contents of the branch of the median nerve.  These were followed back to the transverse carpal ligament.  The nerve was gently isolated from the ligament and released on its ulnar border with scissors extending into the distal forearm.  The volar forearm fascia was also released with scissors. This widened the opening to the carpal canal.  No masses or other predicaments were noted.  Bleeding along the margin of the released  ligament were electrocauterized with bipolar forceps.  This wound was then repaired with intradermal 3-0 Prolene.  A compressive dressing applied and Xeroform, sterile gauze, sterile Webril, and a volar plaster splint maintaining the wrist at 5 degrees dorsiflexion.  A compressive dressing was applied to the elbow with sterile guaze, Webril and Ace wrap.  Ms. Angela Nevin tolerated the procedure well.  She was transferred to the recovery room with stable vital signs.  She will be discharged to the care of her family with prescriptions for Percocet 5/325 one or two tablets p.o. q.4-6h. p.r.n. pain, also Keflex 500 mg one p.o. q.8h. x 4 days as prophylactic antibiotic. DD:  05/16/00 TD:  05/17/00 Job: [id number]

## 2011-04-04 ENCOUNTER — Telehealth: Payer: Self-pay | Admitting: *Deleted

## 2011-04-04 NOTE — Telephone Encounter (Signed)
Patient requesting RF of percocet 

## 2011-04-06 MED ORDER — OXYCODONE-ACETAMINOPHEN 10-325 MG PO TABS: 1.0000 | ORAL_TABLET | Freq: Four times a day (QID) | ORAL | Status: DC | PRN

## 2011-04-06 NOTE — Telephone Encounter (Signed)
OK per MD, Rx printed signed and given to pt

## 2011-04-06 NOTE — Telephone Encounter (Signed)
Thx

## 2011-04-12 ENCOUNTER — Ambulatory Visit: Payer: BC Managed Care – PPO | Admitting: Internal Medicine

## 2011-05-04 ENCOUNTER — Ambulatory Visit: Payer: BC Managed Care – PPO | Admitting: Internal Medicine

## 2011-05-06 ENCOUNTER — Telehealth: Payer: Self-pay | Admitting: Internal Medicine

## 2011-05-06 MED ORDER — OXYCODONE-ACETAMINOPHEN 10-325 MG PO TABS: 1.0000 | ORAL_TABLET | Freq: Four times a day (QID) | ORAL | Status: DC | PRN

## 2011-05-06 NOTE — Telephone Encounter (Signed)
Needs ref for percocet - done

## 2011-06-08 ENCOUNTER — Other Ambulatory Visit: Payer: Self-pay | Admitting: *Deleted

## 2011-06-08 MED ORDER — OXYCODONE-ACETAMINOPHEN 10-325 MG PO TABS: 1.0000 | ORAL_TABLET | Freq: Four times a day (QID) | ORAL | Status: DC | PRN

## 2011-06-28 ENCOUNTER — Encounter: Payer: Self-pay | Admitting: Internal Medicine

## 2011-06-28 ENCOUNTER — Other Ambulatory Visit: Payer: Self-pay | Admitting: *Deleted

## 2011-06-28 ENCOUNTER — Other Ambulatory Visit (INDEPENDENT_AMBULATORY_CARE_PROVIDER_SITE_OTHER): Payer: Medicare Other

## 2011-06-28 ENCOUNTER — Ambulatory Visit (INDEPENDENT_AMBULATORY_CARE_PROVIDER_SITE_OTHER): Payer: Medicare Other | Admitting: Internal Medicine

## 2011-06-28 ENCOUNTER — Other Ambulatory Visit: Payer: Self-pay | Admitting: Internal Medicine

## 2011-06-28 VITALS — BP 120/76 | HR 88 | Temp 98.3°F | Resp 16 | Wt 147.0 lb

## 2011-06-28 DIAGNOSIS — E538 Deficiency of other specified B group vitamins: Secondary | ICD-10-CM

## 2011-06-28 DIAGNOSIS — J449 Chronic obstructive pulmonary disease, unspecified: Secondary | ICD-10-CM

## 2011-06-28 DIAGNOSIS — M899 Disorder of bone, unspecified: Secondary | ICD-10-CM

## 2011-06-28 DIAGNOSIS — M129 Arthropathy, unspecified: Secondary | ICD-10-CM

## 2011-06-28 DIAGNOSIS — Z0000 Encounter for general adult medical examination without abnormal findings: Secondary | ICD-10-CM

## 2011-06-28 DIAGNOSIS — F411 Generalized anxiety disorder: Secondary | ICD-10-CM

## 2011-06-28 DIAGNOSIS — F329 Major depressive disorder, single episode, unspecified: Secondary | ICD-10-CM

## 2011-06-28 DIAGNOSIS — F17200 Nicotine dependence, unspecified, uncomplicated: Secondary | ICD-10-CM

## 2011-06-28 LAB — URINALYSIS, ROUTINE W REFLEX MICROSCOPIC
Hgb urine dipstick: NEGATIVE
Nitrite: NEGATIVE
Specific Gravity, Urine: <=1.005 (ref 1.000–1.030)
Total Protein, Urine: NEGATIVE
Urine Glucose: NEGATIVE
pH: 7 (ref 5.0–8.0)

## 2011-06-28 LAB — CBC WITH DIFFERENTIAL/PLATELET
Basophils Absolute: 0 10*3/uL (ref 0.0–0.1)
HCT: 47.6 % — ABNORMAL HIGH (ref 36.0–46.0)
Hemoglobin: 16 g/dL — ABNORMAL HIGH (ref 12.0–15.0)
Lymphs Abs: 1.8 10*3/uL (ref 0.7–4.0)
MCHC: 33.6 g/dL (ref 30.0–36.0)
MCV: 88.3 fl (ref 78.0–100.0)
Monocytes Absolute: 0.5 10*3/uL (ref 0.1–1.0)
Monocytes Relative: 7.5 % (ref 3.0–12.0)
Neutro Abs: 4 10*3/uL (ref 1.4–7.7)
Platelets: 187 10*3/uL (ref 150.0–400.0)
RDW: 14 % (ref 11.5–14.6)

## 2011-06-28 LAB — TSH: TSH: 1.53 u[IU]/mL (ref 0.35–5.50)

## 2011-06-28 LAB — HEPATIC FUNCTION PANEL
Albumin: 4.4 g/dL (ref 3.5–5.2)
Alkaline Phosphatase: 51 U/L (ref 39–117)
Bilirubin, Direct: 0.1 mg/dL (ref 0.0–0.3)
Total Protein: 7.6 g/dL (ref 6.0–8.3)

## 2011-06-28 LAB — BASIC METABOLIC PANEL
CO2: 31 mEq/L (ref 19–32)
Calcium: 9.7 mg/dL (ref 8.4–10.5)
Creatinine, Ser: 0.7 mg/dL (ref 0.4–1.2)
GFR: 90.51 mL/min (ref >60.00)
Sodium: 143 mEq/L (ref 135–145)

## 2011-06-28 LAB — LIPID PANEL
Cholesterol: 241 mg/dL — ABNORMAL HIGH (ref 0–200)
HDL: 49.2 mg/dL
Total CHOL/HDL Ratio: 5
Triglycerides: 193 mg/dL — ABNORMAL HIGH (ref 0.0–149.0)
VLDL: 38.6 mg/dL (ref 0.0–40.0)

## 2011-06-28 LAB — LDL CHOLESTEROL, DIRECT: Direct LDL: 162.8 mg/dL

## 2011-06-28 MED ORDER — OXYCODONE-ACETAMINOPHEN 10-325 MG PO TABS: 1.0000 | ORAL_TABLET | Freq: Four times a day (QID) | ORAL | Status: DC | PRN

## 2011-06-28 MED ORDER — ALBUTEROL SULFATE HFA 108 (90 BASE) MCG/ACT IN AERS: 2.0000 | INHALATION_SPRAY | Freq: Four times a day (QID) | RESPIRATORY_TRACT | Status: DC | PRN

## 2011-06-28 MED ORDER — ROFLUMILAST 500 MCG PO TABS: 500.0000 ug | ORAL_TABLET | Freq: Every day | ORAL | Status: DC

## 2011-06-28 NOTE — Assessment & Plan Note (Signed)
Continue with current prescription therapy as reflected on the Med list. Better on Daliresp

## 2011-06-28 NOTE — Assessment & Plan Note (Signed)
Vit D 

## 2011-06-28 NOTE — Assessment & Plan Note (Signed)
Discussed.

## 2011-06-28 NOTE — Assessment & Plan Note (Signed)
Continue with current prescription therapy as reflected on the Med list.  

## 2011-06-28 NOTE — Progress Notes (Signed)
  Subjective:    Patient ID: Theresa Aguirre, female    DOB: 1945-12-14, 64 y.o.   MRN: [id number]  HPI    The patient is here to follow up on chronic OA, COPD - better, depression, anxiety, headaches and chronic moderate OA and fibromyalgia symptoms controlled better with medicines    Review of Systems  Constitutional: Positive for fatigue. Negative for chills, activity change, appetite change and unexpected weight change.  HENT: Negative for congestion, mouth sores and sinus pressure.   Eyes: Negative for visual disturbance.  Respiratory: Positive for cough, shortness of breath (better) and wheezing. Negative for chest tightness.   Gastrointestinal: Negative for nausea and abdominal pain.  Genitourinary: Negative for frequency, difficulty urinating and vaginal pain.  Musculoskeletal: Positive for arthralgias. Negative for back pain and gait problem.  Skin: Negative for pallor and rash.  Neurological: Negative for dizziness, tremors, weakness, numbness and headaches.  Psychiatric/Behavioral: Positive for sleep disturbance. Negative for confusion. The patient is nervous/anxious.        Objective:   Physical Exam  Constitutional: She appears well-developed and well-nourished. No distress.  HENT:  Head: Normocephalic.  Right Ear: External ear normal.  Left Ear: External ear normal.  Nose: Nose normal.  Mouth/Throat: Oropharynx is clear and moist.  Eyes: Conjunctivae are normal. Pupils are equal, round, and reactive to light. Right eye exhibits no discharge. Left eye exhibits no discharge.  Neck: Normal range of motion. Neck supple. No JVD present. No tracheal deviation present. No thyromegaly present.  Cardiovascular: Normal rate, regular rhythm and normal heart sounds.   Pulmonary/Chest: No stridor. No respiratory distress. She has wheezes.  Abdominal: Soft. Bowel sounds are normal. She exhibits no distension and no mass. There is no tenderness. There is no rebound and no guarding.   Musculoskeletal: She exhibits tenderness (wrists are tender B). She exhibits no edema.  Lymphadenopathy:    She has no cervical adenopathy.  Neurological: She displays normal reflexes. No cranial nerve deficit. She exhibits normal muscle tone. Coordination normal.  Skin: No rash noted. No erythema.  Psychiatric: She has a normal mood and affect. Her behavior is normal. Judgment and thought content normal.          Assessment & Plan:

## 2011-07-10 ENCOUNTER — Other Ambulatory Visit: Payer: Self-pay | Admitting: Internal Medicine

## 2011-07-29 ENCOUNTER — Other Ambulatory Visit: Payer: Self-pay

## 2011-07-29 MED ORDER — OXYCODONE-ACETAMINOPHEN 10-325 MG PO TABS
1.0000 | ORAL_TABLET | Freq: Four times a day (QID) | ORAL | Status: DC | PRN
Start: 2011-07-29 — End: 2011-07-29

## 2011-07-29 MED ORDER — OXYCODONE-ACETAMINOPHEN 10-325 MG PO TABS: 1.0000 | ORAL_TABLET | Freq: Four times a day (QID) | ORAL | Status: DC | PRN

## 2011-07-29 NOTE — Telephone Encounter (Signed)
Pt called requesting refill, please advise in Dr Plotnikov's absence, thanks!

## 2011-07-29 NOTE — Telephone Encounter (Signed)
Pt picked up Rx.

## 2011-07-29 NOTE — Telephone Encounter (Signed)
Done hardcopy to dahlia/LIM B  

## 2011-08-07 ENCOUNTER — Other Ambulatory Visit: Payer: Self-pay | Admitting: Internal Medicine

## 2011-08-08 ENCOUNTER — Telehealth: Payer: Self-pay | Admitting: *Deleted

## 2011-08-08 NOTE — Telephone Encounter (Signed)
Requested Medications     ALPRAZolam (XANAX) 1 MG tablet [Pharmacy Med Name: ALPRAZOLAM 1MG  TABLETS]   TAKE 1 TABLET BY MOUTH THREE TIMES DAILY AS NEEDED FOR SLEEP. TAKE 1/2 TO 1 TABLET BY MOUTH THREE TIMES DAILY AS NEEDED FOR ANXIETY   Disp: 90 tablet R: 0 Start: 08/07/2011  Class: Normal   Originally ordered on: 02/01/2011  Last refill: 07/26/2011

## 2011-08-08 NOTE — Telephone Encounter (Signed)
OK to fill this prescription with additional refills x3 Thank you!  

## 2011-08-08 NOTE — Telephone Encounter (Signed)
Please advise- ok to Rf below request?

## 2011-08-09 MED ORDER — ALPRAZOLAM 1 MG PO TABS: 1.0000 mg | ORAL_TABLET | Freq: Three times a day (TID) | ORAL | Status: DC | PRN

## 2011-08-26 ENCOUNTER — Telehealth: Payer: Self-pay | Admitting: *Deleted

## 2011-08-26 NOTE — Telephone Encounter (Signed)
OK to fill this prescription with additional refills x0 Thank you!  

## 2011-08-26 NOTE — Telephone Encounter (Signed)
Pt req Rf on oxycodone to p/u Monday as she is going out of town [employer]. Please advise.

## 2011-08-29 MED ORDER — OXYCODONE-ACETAMINOPHEN 10-325 MG PO TABS: 1.0000 | ORAL_TABLET | Freq: Four times a day (QID) | ORAL | Status: DC | PRN

## 2011-08-29 NOTE — Telephone Encounter (Signed)
rx printed/signed. Given to pt.

## 2011-09-27 ENCOUNTER — Telehealth: Payer: Self-pay | Admitting: *Deleted

## 2011-09-27 NOTE — Telephone Encounter (Signed)
Pt is requesting Rf on Percocet 10/325 to p/u tom 09-28-11. Ok to Rf?

## 2011-09-28 MED ORDER — OXYCODONE-ACETAMINOPHEN 10-325 MG PO TABS: 1.0000 | ORAL_TABLET | Freq: Four times a day (QID) | ORAL | Status: DC | PRN

## 2011-09-28 NOTE — Telephone Encounter (Signed)
Ok to fill X 3 mo per Dr. Posey Rea. Left detailed mess informing pt Rx's ready for p/u.

## 2011-11-02 ENCOUNTER — Encounter: Payer: Self-pay | Admitting: Internal Medicine

## 2011-11-02 ENCOUNTER — Ambulatory Visit (INDEPENDENT_AMBULATORY_CARE_PROVIDER_SITE_OTHER): Payer: Medicare Other | Admitting: Internal Medicine

## 2011-11-02 DIAGNOSIS — F411 Generalized anxiety disorder: Secondary | ICD-10-CM

## 2011-11-02 DIAGNOSIS — J449 Chronic obstructive pulmonary disease, unspecified: Secondary | ICD-10-CM

## 2011-11-02 DIAGNOSIS — M949 Disorder of cartilage, unspecified: Secondary | ICD-10-CM

## 2011-11-02 DIAGNOSIS — J4489 Other specified chronic obstructive pulmonary disease: Secondary | ICD-10-CM

## 2011-11-02 DIAGNOSIS — F329 Major depressive disorder, single episode, unspecified: Secondary | ICD-10-CM

## 2011-11-02 DIAGNOSIS — M129 Arthropathy, unspecified: Secondary | ICD-10-CM

## 2011-11-02 DIAGNOSIS — F3289 Other specified depressive episodes: Secondary | ICD-10-CM

## 2011-11-02 DIAGNOSIS — M899 Disorder of bone, unspecified: Secondary | ICD-10-CM

## 2011-11-02 MED ORDER — ROFLUMILAST 500 MCG PO TABS: 500.0000 ug | ORAL_TABLET | Freq: Every day | ORAL | Status: DC

## 2011-11-02 MED ORDER — ALPRAZOLAM 1 MG PO TABS: 1.0000 mg | ORAL_TABLET | Freq: Three times a day (TID) | ORAL | Status: DC | PRN

## 2011-11-02 MED ORDER — OXYCODONE-ACETAMINOPHEN 10-325 MG PO TABS: 1.0000 | ORAL_TABLET | Freq: Four times a day (QID) | ORAL | Status: DC | PRN

## 2011-11-02 MED ORDER — ESTROGENS CONJUGATED 0.625 MG PO TABS: 0.6250 mg | ORAL_TABLET | Freq: Every day | ORAL | Status: DC

## 2011-11-02 MED ORDER — BUPROPION HCL ER (SR) 150 MG PO TB12: 150.0000 mg | ORAL_TABLET | Freq: Two times a day (BID) | ORAL | Status: DC

## 2011-11-02 MED ORDER — TEMAZEPAM 15 MG PO CAPS: 15.0000 mg | ORAL_CAPSULE | Freq: Every evening | ORAL | Status: DC | PRN

## 2011-11-02 NOTE — Assessment & Plan Note (Signed)
Chronic - ongoing refractory tobacco use - she stopped smoking on 10/11/11! Better on Daliresp

## 2011-11-02 NOTE — Assessment & Plan Note (Signed)
Continue with current prescription therapy as reflected on the Med list.  

## 2011-11-02 NOTE — Progress Notes (Signed)
  Subjective:    Patient ID: Theresa Aguirre, female    DOB: 04-12-1946, 66 y.o.   MRN: [id number]  HPI  The patient presents for a follow-up of  COPD, chronic anxiety, chronic dyslipidemia, depression controlled with medicines. She quit smoking 1/13     Review of Systems  Constitutional: Negative for chills, activity change, appetite change, fatigue and unexpected weight change.  HENT: Negative for congestion, mouth sores and sinus pressure.   Eyes: Negative for visual disturbance.  Respiratory: Negative for cough and chest tightness.   Gastrointestinal: Negative for nausea and abdominal pain.  Genitourinary: Negative for frequency, difficulty urinating and vaginal pain.  Musculoskeletal: Negative for back pain and gait problem.  Skin: Negative for pallor and rash.  Neurological: Negative for dizziness, tremors, weakness, numbness and headaches.  Psychiatric/Behavioral: Positive for sleep disturbance and decreased concentration. Negative for suicidal ideas and confusion. The patient is nervous/anxious.        Objective:   Physical Exam  Constitutional: She appears well-developed. No distress.  HENT:  Head: Normocephalic.  Right Ear: External ear normal.  Left Ear: External ear normal.  Nose: Nose normal.  Mouth/Throat: Oropharynx is clear and moist.  Eyes: Conjunctivae are normal. Pupils are equal, round, and reactive to light. Right eye exhibits no discharge. Left eye exhibits no discharge.  Neck: Normal range of motion. Neck supple. No JVD present. No tracheal deviation present. No thyromegaly present.  Cardiovascular: Normal rate, regular rhythm and normal heart sounds.   Pulmonary/Chest: No stridor. No respiratory distress. She has no wheezes.  Abdominal: Soft. Bowel sounds are normal. She exhibits no distension and no mass. There is no tenderness. There is no rebound and no guarding.  Musculoskeletal: She exhibits no edema and no tenderness.  Lymphadenopathy:    She has  no cervical adenopathy.  Neurological: She displays normal reflexes. No cranial nerve deficit. She exhibits normal muscle tone. Coordination normal.  Skin: No rash noted. No erythema.  Psychiatric: Her behavior is normal. Judgment and thought content normal.          Assessment & Plan:

## 2011-11-05 ENCOUNTER — Encounter: Payer: Self-pay | Admitting: Internal Medicine

## 2012-01-25 ENCOUNTER — Telehealth: Payer: Self-pay | Admitting: *Deleted

## 2012-01-25 NOTE — Telephone Encounter (Signed)
Pt is requesting Rf on Percocet. Please advise.

## 2012-01-26 NOTE — Telephone Encounter (Signed)
OK to fill this prescription with additional refills x0 Thank you!  

## 2012-01-27 MED ORDER — OXYCODONE-ACETAMINOPHEN 10-325 MG PO TABS: 1.0000 | ORAL_TABLET | Freq: Four times a day (QID) | ORAL | Status: DC | PRN

## 2012-01-27 NOTE — Telephone Encounter (Signed)
Rx printed/signed by Dr. Debby Bud in AVP's absence. Pt informed to p/u Rx [facility] on 01[facility]28/12. Pt understands.

## 2012-02-23 ENCOUNTER — Telehealth: Payer: Self-pay | Admitting: Internal Medicine

## 2012-02-23 NOTE — Telephone Encounter (Signed)
OK to fill this prescription with additional refills x0 Thank you!  

## 2012-02-23 NOTE — Telephone Encounter (Signed)
Pt requesting percocet refill

## 2012-02-24 MED ORDER — OXYCODONE-ACETAMINOPHEN 10-325 MG PO TABS: 1.0000 | ORAL_TABLET | Freq: Four times a day (QID) | ORAL | Status: DC | PRN

## 2012-02-24 NOTE — Telephone Encounter (Signed)
Rx printed/pending MD sig. Left detailed mess informing pt she can p/u later today.

## 2012-03-06 ENCOUNTER — Telehealth: Payer: Self-pay | Admitting: Internal Medicine

## 2012-03-21 ENCOUNTER — Ambulatory Visit (INDEPENDENT_AMBULATORY_CARE_PROVIDER_SITE_OTHER): Payer: Medicare Other | Admitting: Internal Medicine

## 2012-03-21 ENCOUNTER — Encounter: Payer: Self-pay | Admitting: Internal Medicine

## 2012-03-21 VITALS — BP 110/68 | HR 72 | Temp 98.0°F | Resp 16 | Wt 145.0 lb

## 2012-03-21 DIAGNOSIS — F17200 Nicotine dependence, unspecified, uncomplicated: Secondary | ICD-10-CM

## 2012-03-21 DIAGNOSIS — K219 Gastro-esophageal reflux disease without esophagitis: Secondary | ICD-10-CM

## 2012-03-21 DIAGNOSIS — F411 Generalized anxiety disorder: Secondary | ICD-10-CM

## 2012-03-21 MED ORDER — ALPRAZOLAM 1 MG PO TABS: 1.0000 mg | ORAL_TABLET | Freq: Three times a day (TID) | ORAL | Status: DC | PRN

## 2012-03-21 MED ORDER — OXYCODONE-ACETAMINOPHEN 10-325 MG PO TABS: 1.0000 | ORAL_TABLET | Freq: Four times a day (QID) | ORAL | Status: DC | PRN

## 2012-03-21 MED ORDER — ESTROGENS CONJUGATED 0.625 MG PO TABS: 0.6250 mg | ORAL_TABLET | Freq: Every day | ORAL | Status: DC

## 2012-03-21 MED ORDER — ROFLUMILAST 500 MCG PO TABS: 500.0000 ug | ORAL_TABLET | Freq: Every day | ORAL | Status: DC

## 2012-03-21 MED ORDER — DESVENLAFAXINE SUCCINATE ER 50 MG PO TB24: 50.0000 mg | ORAL_TABLET | Freq: Every day | ORAL | Status: DC

## 2012-03-21 MED ORDER — PREDNISONE 10 MG PO TABS: ORAL_TABLET | ORAL | Status: DC

## 2012-03-21 MED ORDER — AZITHROMYCIN 250 MG PO TABS: ORAL_TABLET | ORAL | Status: AC

## 2012-03-21 MED ORDER — RANITIDINE HCL 300 MG PO TABS: 300.0000 mg | ORAL_TABLET | Freq: Every day | ORAL | Status: DC

## 2012-03-21 NOTE — Assessment & Plan Note (Signed)
Continue with current prescription therapy as reflected on the Med list.  

## 2012-03-21 NOTE — Progress Notes (Signed)
Patient ID: Theresa Aguirre, female   DOB: [birth year], 66 y.o.   MRN: [id number]  Subjective:    Patient ID: Theresa Aguirre, female    DOB: [birth year], 66 y.o.   MRN: [id number]  Anxiety Symptoms include decreased concentration and nervous/anxious behavior. Patient reports no confusion, dizziness, nausea or suicidal ideas. Symptoms occur most days.      The patient presents for a follow-up of  COPD, chronic anxiety, chronic dyslipidemia, depression controlled with medicines. She quit smoking 1/13, restarted in 5/13 due to stress. C/o depression  BP Readings from Last 3 Encounters:  03/21/12 110/68  11/02/11 130/80  06/28/11 120/76   Wt Readings from Last 3 Encounters:  03/21/12 145 lb (65.772 kg)  11/02/11 149 lb (67.586 kg)  06/28/11 147 lb (66.679 kg)      Review of Systems  Constitutional: Negative for chills, activity change, appetite change, fatigue and unexpected weight change.  HENT: Negative for congestion, mouth sores and sinus pressure.   Eyes: Negative for visual disturbance.  Respiratory: Negative for cough and chest tightness.   Gastrointestinal: Negative for nausea and abdominal pain.  Genitourinary: Negative for frequency, difficulty urinating and vaginal pain.  Musculoskeletal: Negative for back pain and gait problem.  Skin: Negative for pallor and rash.  Neurological: Negative for dizziness, tremors, weakness, numbness and headaches.  Psychiatric/Behavioral: Positive for disturbed wake/sleep cycle and decreased concentration. Negative for suicidal ideas and confusion. The patient is nervous/anxious.        Objective:   Physical Exam  Constitutional: She appears well-developed. No distress.  HENT:  Head: Normocephalic.  Right Ear: External ear normal.  Left Ear: External ear normal.  Nose: Nose normal.  Mouth/Throat: Oropharynx is clear and moist.  Eyes: Conjunctivae are normal. Pupils are equal, round, and reactive to light. Right eye exhibits no  discharge. Left eye exhibits no discharge.  Neck: Normal range of motion. Neck supple. No JVD present. No tracheal deviation present. No thyromegaly present.  Cardiovascular: Normal rate, regular rhythm and normal heart sounds.   Pulmonary/Chest: No stridor. No respiratory distress. She has no wheezes.  Abdominal: Soft. Bowel sounds are normal. She exhibits no distension and no mass. There is no tenderness. There is no rebound and no guarding.  Musculoskeletal: She exhibits no edema and no tenderness.  Lymphadenopathy:    She has no cervical adenopathy.  Neurological: She displays normal reflexes. No cranial nerve deficit. She exhibits normal muscle tone. Coordination normal.  Skin: No rash noted. No erythema.  Psychiatric: Her behavior is normal. Judgment and thought content normal.   Lab Results  Component Value Date   WBC 6.5 06/28/2011   HGB 16.0* 06/28/2011   HCT 47.6* 06/28/2011   PLT 187.0 06/28/2011   GLUCOSE 88 06/28/2011   CHOL 241* 06/28/2011   TRIG 193.0* 06/28/2011   HDL 49.20 06/28/2011   LDLDIRECT 162.8 06/28/2011   ALT 18 06/28/2011   AST 23 06/28/2011   NA 143 06/28/2011   K 4.2 06/28/2011   CL 103 06/28/2011   CREATININE 0.7 06/28/2011   BUN 10 06/28/2011   CO2 31 06/28/2011   TSH 1.53 06/28/2011   HGBA1C 5.6 01/02/2008          Assessment & Plan:

## 2012-03-21 NOTE — Assessment & Plan Note (Signed)
Worse Re-start Rx 

## 2012-03-21 NOTE — Assessment & Plan Note (Signed)
She re-started smoking

## 2012-03-22 ENCOUNTER — Telehealth: Payer: Self-pay | Admitting: *Deleted

## 2012-03-22 NOTE — Telephone Encounter (Signed)
Requested PA Form from [place] at [phone] for Pristiq; received, completed & faxed form for approval at [phone]; awaiting response/SLS

## 2012-03-22 NOTE — Telephone Encounter (Signed)
Patient's insurance no longer covers Temazepam as a formulary drug; pt's options are to keep receiving this medication & pay OOP [$15 mth] or change to a formulary drug on insurance list, with no guarantee that we will still need PA & patient will still have to pay an amount [under $15 mth] for Rx. Patient states she would like to continue on Temazepam and pay OOP/SLS

## 2012-03-30 ENCOUNTER — Telehealth: Payer: Self-pay | Admitting: *Deleted

## 2012-03-30 NOTE — Telephone Encounter (Signed)
Pristiq requires PA. I called ins on 03/28/12 and they state coverage is denied if this was prescribed for anxiety. Im waiting on Dr. Posey Rea to return to see if he wants to change it or if we need to resubmit the PA form. I also called pt to offer Pristiq samples. No answer- Left mess for patient to call back. Samples are upfront.

## 2012-04-02 NOTE — Telephone Encounter (Signed)
Please advise on Pristiq.

## 2012-04-02 NOTE — Telephone Encounter (Signed)
It was Rx for depression Thx

## 2012-04-03 NOTE — Telephone Encounter (Signed)
New Pristiq PA form received, completed & faxed for Approval/SLS 06.25.13

## 2012-04-04 NOTE — Telephone Encounter (Signed)
Ok generic Effexor XR Thx

## 2012-04-04 NOTE — Telephone Encounter (Signed)
I called pt to offer samples and she hasn't returned my call or picked up the samples. The PA was corrected/resubmitted and still DENIED. Please advise what should I do? Can we change it?

## 2012-04-05 MED ORDER — VENLAFAXINE HCL ER 75 MG PO CP24: 75.0000 mg | ORAL_CAPSULE | Freq: Every day | ORAL | Status: DC

## 2012-04-05 NOTE — Telephone Encounter (Signed)
Pt informed

## 2012-04-12 ENCOUNTER — Other Ambulatory Visit: Payer: Self-pay | Admitting: Internal Medicine

## 2012-04-24 ENCOUNTER — Telehealth: Payer: Self-pay | Admitting: *Deleted

## 2012-04-24 NOTE — Telephone Encounter (Signed)
PA for Premarin 0.625 is approved. Pharmacy informed.

## 2012-05-02 ENCOUNTER — Ambulatory Visit: Payer: Medicare Other | Admitting: Internal Medicine

## 2012-05-04 ENCOUNTER — Telehealth: Payer: Self-pay | Admitting: Internal Medicine

## 2012-05-04 NOTE — Telephone Encounter (Signed)
Caller: Jabria/Patient; PCP: Sonda Primes; CB#: [phone]; Call regarding Left  Lower Back Pain; pain is present with movement, coughing or pushing on back; sx started 04/27/12;  Triaged per Back Symptoms Guideline; See in 24 hr d/t pain intensifies with coughing; pt is refusing appt for today and tomorrow; requesting appt on 05/08/12; states that she will come in sooner if she needs to; appt made for 11:15am with Dr Posey Rea per office staff; Homecare given

## 2012-05-08 ENCOUNTER — Ambulatory Visit (INDEPENDENT_AMBULATORY_CARE_PROVIDER_SITE_OTHER): Payer: Medicare Other | Admitting: Internal Medicine

## 2012-05-08 ENCOUNTER — Encounter: Payer: Self-pay | Admitting: Internal Medicine

## 2012-05-08 VITALS — BP 160/90 | HR 88 | Temp 97.9°F | Resp 16 | Wt 150.0 lb

## 2012-05-08 DIAGNOSIS — R071 Chest pain on breathing: Secondary | ICD-10-CM

## 2012-05-08 DIAGNOSIS — F329 Major depressive disorder, single episode, unspecified: Secondary | ICD-10-CM

## 2012-05-08 DIAGNOSIS — K219 Gastro-esophageal reflux disease without esophagitis: Secondary | ICD-10-CM

## 2012-05-08 DIAGNOSIS — J449 Chronic obstructive pulmonary disease, unspecified: Secondary | ICD-10-CM

## 2012-05-08 DIAGNOSIS — R0789 Other chest pain: Secondary | ICD-10-CM | POA: Insufficient documentation

## 2012-05-08 DIAGNOSIS — M545 Low back pain: Secondary | ICD-10-CM

## 2012-05-08 DIAGNOSIS — F411 Generalized anxiety disorder: Secondary | ICD-10-CM

## 2012-05-08 MED ORDER — ALPRAZOLAM 1 MG PO TABS: 1.0000 mg | ORAL_TABLET | Freq: Three times a day (TID) | ORAL | Status: DC | PRN

## 2012-05-08 MED ORDER — OXYCODONE-ACETAMINOPHEN 10-325 MG PO TABS: 1.0000 | ORAL_TABLET | Freq: Four times a day (QID) | ORAL | Status: DC | PRN

## 2012-05-08 NOTE — Assessment & Plan Note (Signed)
Continue with current prescription therapy as reflected on the Med list.  

## 2012-05-08 NOTE — Assessment & Plan Note (Signed)
X ray if not better Rib belt Pain meds

## 2012-05-08 NOTE — Progress Notes (Signed)
   Subjective:    Patient ID: Theresa Aguirre, female    DOB: Sep 29, 1946, 66 y.o.   MRN: [id number]  Back Pain This is a chronic problem. Pertinent negatives include no abdominal pain, headaches, numbness or weakness.  Anxiety Symptoms include decreased concentration and nervous/anxious behavior. Patient reports no confusion, dizziness, nausea or suicidal ideas. Symptoms occur most days.    C/o L ribs pain in the back after picking up a dog toy 10 d ago - severe  The patient presents for a follow-up of  COPD, chronic anxiety, chronic dyslipidemia, depression controlled with medicines. She quit smoking 1/13, restarted in 5/13 due to stress. C/o depression  BP Readings from Last 3 Encounters:  05/08/12 160/90  03/21/12 110/68  11/02/11 130/80   Wt Readings from Last 3 Encounters:  05/08/12 150 lb (68.04 kg)  03/21/12 145 lb (65.772 kg)  11/02/11 149 lb (67.586 kg)      Review of Systems  Constitutional: Negative for chills, activity change, appetite change, fatigue and unexpected weight change.  HENT: Negative for congestion, mouth sores and sinus pressure.   Eyes: Negative for visual disturbance.  Respiratory: Negative for cough and chest tightness.   Gastrointestinal: Negative for nausea and abdominal pain.  Genitourinary: Negative for frequency, difficulty urinating and vaginal pain.  Musculoskeletal: Positive for back pain. Negative for gait problem.  Skin: Negative for pallor and rash.  Neurological: Negative for dizziness, tremors, weakness, numbness and headaches.  Psychiatric/Behavioral: Positive for disturbed wake/sleep cycle and decreased concentration. Negative for suicidal ideas and confusion. The patient is nervous/anxious.        Objective:   Physical Exam  Constitutional: She appears well-developed. No distress.  HENT:  Head: Normocephalic.  Right Ear: External ear normal.  Left Ear: External ear normal.  Nose: Nose normal.  Mouth/Throat: Oropharynx is  clear and moist.  Eyes: Conjunctivae are normal. Pupils are equal, round, and reactive to light. Right eye exhibits no discharge. Left eye exhibits no discharge.  Neck: Normal range of motion. Neck supple. No JVD present. No tracheal deviation present. No thyromegaly present.  Cardiovascular: Normal rate, regular rhythm and normal heart sounds.   Pulmonary/Chest: No stridor. No respiratory distress. She has no wheezes.  Abdominal: Soft. Bowel sounds are normal. She exhibits no distension and no mass. There is no tenderness. There is no rebound and no guarding.  Musculoskeletal: She exhibits no edema and no tenderness.  Lymphadenopathy:    She has no cervical adenopathy.  Neurological: She displays normal reflexes. No cranial nerve deficit. She exhibits normal muscle tone. Coordination normal.  Skin: No rash noted. No erythema.  Psychiatric: Her behavior is normal. Judgment and thought content normal.  L posterolat ribs area is tender to palp  Lab Results  Component Value Date   WBC 6.5 06/28/2011   HGB 16.0* 06/28/2011   HCT 47.6* 06/28/2011   PLT 187.0 06/28/2011   GLUCOSE 88 06/28/2011   CHOL 241* 06/28/2011   TRIG 193.0* 06/28/2011   HDL 49.20 06/28/2011   LDLDIRECT 162.8 06/28/2011   ALT 18 06/28/2011   AST 23 06/28/2011   NA 143 06/28/2011   K 4.2 06/28/2011   CL 103 06/28/2011   CREATININE 0.7 06/28/2011   BUN 10 06/28/2011   CO2 31 06/28/2011   TSH 1.53 06/28/2011   HGBA1C 5.6 01/02/2008          Assessment & Plan:

## 2012-05-08 NOTE — Assessment & Plan Note (Signed)
Chronic - ongoing refractory tobacco use - she stopped smoking on 10/11/11, restarted in 5/13 Better on Daliresp

## 2012-05-08 NOTE — Assessment & Plan Note (Signed)
Better on Rx 

## 2012-05-08 NOTE — Assessment & Plan Note (Signed)
Better  

## 2012-05-09 ENCOUNTER — Telehealth: Payer: Self-pay | Admitting: Internal Medicine

## 2012-05-09 MED ORDER — NAPROXEN-ESOMEPRAZOLE 500-20 MG PO TBEC: 1.0000 | DELAYED_RELEASE_TABLET | Freq: Two times a day (BID) | ORAL | Status: DC

## 2012-05-09 NOTE — Telephone Encounter (Signed)
Ok Thx 

## 2012-05-09 NOTE — Telephone Encounter (Signed)
Theresa Aguirre had samples of Vimovo.  She wants an RX sent in for this.

## 2012-05-09 NOTE — Telephone Encounter (Signed)
Pt aware to ck with her pharmacy.

## 2012-05-22 ENCOUNTER — Ambulatory Visit: Payer: Medicare Other | Admitting: Internal Medicine

## 2012-05-28 ENCOUNTER — Other Ambulatory Visit: Payer: Self-pay | Admitting: Internal Medicine

## 2012-05-28 NOTE — Telephone Encounter (Signed)
Ok to Rf? 

## 2012-06-06 ENCOUNTER — Ambulatory Visit: Payer: Medicare Other | Admitting: Internal Medicine

## 2012-06-13 ENCOUNTER — Ambulatory Visit (INDEPENDENT_AMBULATORY_CARE_PROVIDER_SITE_OTHER): Payer: Medicare Other | Admitting: Internal Medicine

## 2012-06-13 ENCOUNTER — Encounter: Payer: Self-pay | Admitting: Internal Medicine

## 2012-06-13 VITALS — BP 98/58 | HR 80 | Temp 98.1°F | Resp 16 | Wt 148.0 lb

## 2012-06-13 DIAGNOSIS — E785 Hyperlipidemia, unspecified: Secondary | ICD-10-CM

## 2012-06-13 DIAGNOSIS — F411 Generalized anxiety disorder: Secondary | ICD-10-CM

## 2012-06-13 DIAGNOSIS — F17200 Nicotine dependence, unspecified, uncomplicated: Secondary | ICD-10-CM

## 2012-06-13 DIAGNOSIS — M79609 Pain in unspecified limb: Secondary | ICD-10-CM

## 2012-06-13 DIAGNOSIS — J449 Chronic obstructive pulmonary disease, unspecified: Secondary | ICD-10-CM

## 2012-06-13 DIAGNOSIS — F329 Major depressive disorder, single episode, unspecified: Secondary | ICD-10-CM

## 2012-06-13 MED ORDER — OXYCODONE-ACETAMINOPHEN 10-325 MG PO TABS: 1.0000 | ORAL_TABLET | Freq: Four times a day (QID) | ORAL | Status: DC | PRN

## 2012-06-13 MED ORDER — ALPRAZOLAM 1 MG PO TABS: 1.0000 mg | ORAL_TABLET | Freq: Three times a day (TID) | ORAL | Status: DC | PRN

## 2012-06-13 MED ORDER — BUPROPION HCL ER (SR) 150 MG PO TB12: 150.0000 mg | ORAL_TABLET | Freq: Two times a day (BID) | ORAL | Status: DC

## 2012-06-13 MED ORDER — OMEPRAZOLE 40 MG PO CPDR: 40.0000 mg | DELAYED_RELEASE_CAPSULE | Freq: Every day | ORAL | Status: DC

## 2012-06-13 MED ORDER — DIETHYLPROPION HCL 25 MG PO TABS: ORAL_TABLET | ORAL | Status: DC

## 2012-06-13 MED ORDER — RANITIDINE HCL 300 MG PO TABS: 300.0000 mg | ORAL_TABLET | Freq: Every day | ORAL | Status: DC

## 2012-06-13 NOTE — Assessment & Plan Note (Signed)
Continue with current prescription therapy as reflected on the Med list.  

## 2012-06-13 NOTE — Progress Notes (Signed)
Subjective:    Patient ID: Theresa Aguirre, female    DOB: Mar 08, 1946, 66 y.o.   MRN: [id number]  Back Pain This is a chronic problem. The problem has been waxing and waning since onset. The pain is present in the sacro-iliac and lumbar spine. The quality of the pain is described as aching. The pain is at a severity of 7/10. The pain is severe. The pain is worse during the day. The symptoms are aggravated by bending and position. Pertinent negatives include no abdominal pain, headaches, numbness or weakness.  Anxiety Presents for follow-up visit. Symptoms include decreased concentration and nervous/anxious behavior. Patient reports no confusion, dizziness, nausea or suicidal ideas. Symptoms occur most days. The quality of sleep is fair. Nighttime awakenings: occasional.   Compliance with medications is 26-50%.    The patient presents for a follow-up of  COPD, chronic anxiety, chronic dyslipidemia, depression controlled with medicines. She quit smoking 1/13, restarted in 5/13 due to stress.   BP Readings from Last 3 Encounters:  06/13/12 98/58  05/08/12 160/90  03/21/12 110/68   Wt Readings from Last 3 Encounters:  06/13/12 148 lb (67.132 kg)  05/08/12 150 lb (68.04 kg)  03/21/12 145 lb (65.772 kg)      Review of Systems  Constitutional: Negative for chills, activity change, appetite change, fatigue and unexpected weight change.  HENT: Negative for congestion, mouth sores and sinus pressure.   Eyes: Negative for visual disturbance.  Respiratory: Negative for cough and chest tightness.   Gastrointestinal: Negative for nausea and abdominal pain.  Genitourinary: Negative for frequency, difficulty urinating and vaginal pain.  Musculoskeletal: Positive for back pain. Negative for gait problem.  Skin: Negative for pallor and rash.  Neurological: Negative for dizziness, tremors, weakness, numbness and headaches.  Psychiatric/Behavioral: Positive for disturbed wake/sleep cycle and  decreased concentration. Negative for suicidal ideas and confusion. The patient is nervous/anxious.        Objective:   Physical Exam  Constitutional: She appears well-developed. No distress.  HENT:  Head: Normocephalic.  Right Ear: External ear normal.  Left Ear: External ear normal.  Nose: Nose normal.  Mouth/Throat: Oropharynx is clear and moist.  Eyes: Conjunctivae are normal. Pupils are equal, round, and reactive to light. Right eye exhibits no discharge. Left eye exhibits no discharge.  Neck: Normal range of motion. Neck supple. No JVD present. No tracheal deviation present. No thyromegaly present.  Cardiovascular: Normal rate, regular rhythm and normal heart sounds.   Pulmonary/Chest: No stridor. No respiratory distress. She has no wheezes.  Abdominal: Soft. Bowel sounds are normal. She exhibits no distension and no mass. There is no tenderness. There is no rebound and no guarding.  Musculoskeletal: She exhibits no edema and no tenderness.  Lymphadenopathy:    She has no cervical adenopathy.  Neurological: She displays normal reflexes. No cranial nerve deficit. She exhibits normal muscle tone. Coordination normal.  Skin: No rash noted. No erythema.  Psychiatric: Her behavior is normal. Judgment and thought content normal.  L posterolat ribs area is non-tender to palp  Lab Results  Component Value Date   WBC 6.5 06/28/2011   HGB 16.0* 06/28/2011   HCT 47.6* 06/28/2011   PLT 187.0 06/28/2011   GLUCOSE 88 06/28/2011   CHOL 241* 06/28/2011   TRIG 193.0* 06/28/2011   HDL 49.20 06/28/2011   LDLDIRECT 162.8 06/28/2011   ALT 18 06/28/2011   AST 23 06/28/2011   NA 143 06/28/2011   K 4.2 06/28/2011   CL 103 06/28/2011  CREATININE 0.7 06/28/2011   BUN 10 06/28/2011   CO2 31 06/28/2011   TSH 1.53 06/28/2011   HGBA1C 5.6 01/02/2008          Assessment & Plan:

## 2012-06-13 NOTE — Assessment & Plan Note (Signed)
CV and pulmonary risks were discussed

## 2012-06-20 ENCOUNTER — Ambulatory Visit: Payer: Medicare Other | Admitting: Internal Medicine

## 2012-07-26 ENCOUNTER — Telehealth: Payer: Self-pay | Admitting: Internal Medicine

## 2012-07-26 NOTE — Telephone Encounter (Signed)
OK to fill this prescription with additional refills x0 Thank you!  

## 2012-07-26 NOTE — Telephone Encounter (Signed)
Pt req refill for Oxycodone 10-325mg , pt stated the drug store would not send a req to [place] and pt is out of this med. Please call pt ASAP once its done.

## 2012-07-27 MED ORDER — OXYCODONE-ACETAMINOPHEN 10-325 MG PO TABS: 1.0000 | ORAL_TABLET | Freq: Four times a day (QID) | ORAL | Status: DC | PRN

## 2012-07-27 NOTE — Telephone Encounter (Signed)
Rx printed/ Pt informed to p/u after 1 pm today.

## 2012-08-08 ENCOUNTER — Encounter: Payer: Self-pay | Admitting: Internal Medicine

## 2012-08-08 ENCOUNTER — Ambulatory Visit (INDEPENDENT_AMBULATORY_CARE_PROVIDER_SITE_OTHER): Payer: Medicare Other | Admitting: Internal Medicine

## 2012-08-08 VITALS — BP 140/80 | HR 88 | Temp 97.5°F | Resp 16 | Wt 151.0 lb

## 2012-08-08 DIAGNOSIS — F17200 Nicotine dependence, unspecified, uncomplicated: Secondary | ICD-10-CM

## 2012-08-08 DIAGNOSIS — M129 Arthropathy, unspecified: Secondary | ICD-10-CM

## 2012-08-08 DIAGNOSIS — R7309 Other abnormal glucose: Secondary | ICD-10-CM

## 2012-08-08 DIAGNOSIS — R030 Elevated blood-pressure reading, without diagnosis of hypertension: Secondary | ICD-10-CM

## 2012-08-08 DIAGNOSIS — G500 Trigeminal neuralgia: Secondary | ICD-10-CM

## 2012-08-08 DIAGNOSIS — E785 Hyperlipidemia, unspecified: Secondary | ICD-10-CM

## 2012-08-08 DIAGNOSIS — Z23 Encounter for immunization: Secondary | ICD-10-CM

## 2012-08-08 DIAGNOSIS — F329 Major depressive disorder, single episode, unspecified: Secondary | ICD-10-CM

## 2012-08-08 DIAGNOSIS — F411 Generalized anxiety disorder: Secondary | ICD-10-CM

## 2012-08-08 MED ORDER — OXYCODONE-ACETAMINOPHEN 10-325 MG PO TABS: 1.0000 | ORAL_TABLET | Freq: Four times a day (QID) | ORAL | Status: DC | PRN

## 2012-08-08 MED ORDER — GABAPENTIN 100 MG PO CAPS: 100.0000 mg | ORAL_CAPSULE | Freq: Three times a day (TID) | ORAL | Status: DC

## 2012-08-08 MED ORDER — IBUPROFEN 600 MG PO TABS: 600.0000 mg | ORAL_TABLET | Freq: Two times a day (BID) | ORAL | Status: DC | PRN

## 2012-08-08 MED ORDER — HYDROCHLOROTHIAZIDE 12.5 MG PO CAPS: 12.5000 mg | ORAL_CAPSULE | Freq: Every day | ORAL | Status: DC

## 2012-08-08 MED ORDER — TEMAZEPAM 15 MG PO CAPS: 15.0000 mg | ORAL_CAPSULE | Freq: Every evening | ORAL | Status: DC | PRN

## 2012-08-08 NOTE — Assessment & Plan Note (Signed)
Continue with current prescription therapy as reflected on the Med list.  

## 2012-08-08 NOTE — Assessment & Plan Note (Signed)
Watching  

## 2012-08-08 NOTE — Assessment & Plan Note (Signed)
Gabapentin

## 2012-08-08 NOTE — Progress Notes (Signed)
Patient ID: Theresa Aguirre, female   DOB: 28-Apr-1946, 66 y.o.   MRN: [id number]   Subjective:    Patient ID: Theresa Aguirre, female    DOB: June 01, 1946, 66 y.o.   MRN: [id number]  Hypertension This is a new problem. The current episode started more than 1 month ago. Associated symptoms include anxiety and headaches. Pertinent negatives include no blurred vision or palpitations. Past treatments include nothing.  Back Pain This is a chronic problem. The problem has been waxing and waning since onset. The pain is present in the sacro-iliac and lumbar spine. The quality of the pain is described as aching. The pain is at a severity of 7/10. The pain is severe. The pain is worse during the day. The symptoms are aggravated by bending and position. Associated symptoms include headaches. Pertinent negatives include no abdominal pain, numbness or weakness.  Anxiety Presents for follow-up visit. Symptoms include decreased concentration and nervous/anxious behavior. Patient reports no confusion, dizziness, nausea, palpitations or suicidal ideas. Symptoms occur most days. The quality of sleep is fair. Nighttime awakenings: occasional.   Compliance with medications is 26-50%.    The patient presents for a follow-up of  COPD, chronic anxiety, chronic dyslipidemia, depression controlled with medicines. She quit smoking 1/13, restarted in 5/13 due to stress.   BP Readings from Last 3 Encounters:  08/08/12 140/80  06/13/12 98/58  05/08/12 160/90   Wt Readings from Last 3 Encounters:  08/08/12 151 lb (68.493 kg)  06/13/12 148 lb (67.132 kg)  05/08/12 150 lb (68.04 kg)      Review of Systems  Constitutional: Negative for chills, activity change, appetite change, fatigue and unexpected weight change.  HENT: Negative for congestion, mouth sores and sinus pressure.   Eyes: Negative for blurred vision and visual disturbance.  Respiratory: Negative for cough and chest tightness.   Cardiovascular: Negative for  palpitations.  Gastrointestinal: Negative for nausea and abdominal pain.  Genitourinary: Negative for frequency, difficulty urinating and vaginal pain.  Musculoskeletal: Positive for back pain. Negative for gait problem.  Skin: Negative for pallor and rash.  Neurological: Positive for headaches. Negative for dizziness, tremors, weakness and numbness.  Psychiatric/Behavioral: Positive for disturbed wake/sleep cycle and decreased concentration. Negative for suicidal ideas and confusion. The patient is nervous/anxious.        Objective:   Physical Exam  Constitutional: She appears well-developed. No distress.  HENT:  Head: Normocephalic.  Right Ear: External ear normal.  Left Ear: External ear normal.  Nose: Nose normal.  Mouth/Throat: Oropharynx is clear and moist.  Eyes: Conjunctivae normal are normal. Pupils are equal, round, and reactive to light. Right eye exhibits no discharge. Left eye exhibits no discharge.  Neck: Normal range of motion. Neck supple. No JVD present. No tracheal deviation present. No thyromegaly present.  Cardiovascular: Normal rate, regular rhythm and normal heart sounds.   Pulmonary/Chest: No stridor. No respiratory distress. She has no wheezes.  Abdominal: Soft. Bowel sounds are normal. She exhibits no distension and no mass. There is no tenderness. There is no rebound and no guarding.  Musculoskeletal: She exhibits no edema and no tenderness.  Lymphadenopathy:    She has no cervical adenopathy.  Neurological: She displays normal reflexes. No cranial nerve deficit. She exhibits normal muscle tone. Coordination normal.  Skin: No rash noted. No erythema.  Psychiatric: Her behavior is normal. Judgment and thought content normal.  L posterolat ribs area is non-tender to palp  Lab Results  Component Value Date   WBC 6.5 06/28/2011  HGB 16.0* 06/28/2011   HCT 47.6* 06/28/2011   PLT 187.0 06/28/2011   GLUCOSE 88 06/28/2011   CHOL 241* 06/28/2011   TRIG 193.0*  06/28/2011   HDL 49.20 06/28/2011   LDLDIRECT 162.8 06/28/2011   ALT 18 06/28/2011   AST 23 06/28/2011   NA 143 06/28/2011   K 4.2 06/28/2011   CL 103 06/28/2011   CREATININE 0.7 06/28/2011   BUN 10 06/28/2011   CO2 31 06/28/2011   TSH 1.53 06/28/2011   HGBA1C 5.6 01/02/2008          Assessment & Plan:

## 2012-08-08 NOTE — Assessment & Plan Note (Signed)
NAS diet HCTZ 12.5 mg/d

## 2012-08-08 NOTE — Assessment & Plan Note (Signed)
  On diet  

## 2012-08-08 NOTE — Assessment & Plan Note (Signed)
Still smoking. Discussed

## 2012-08-22 ENCOUNTER — Telehealth: Payer: Self-pay | Admitting: *Deleted

## 2012-08-22 NOTE — Telephone Encounter (Signed)
PA for Temazepam 15 mg approved until 10/09/2013. Pharmacy informed.

## 2012-09-21 ENCOUNTER — Ambulatory Visit (INDEPENDENT_AMBULATORY_CARE_PROVIDER_SITE_OTHER): Payer: Medicare Other | Admitting: Internal Medicine

## 2012-09-21 ENCOUNTER — Encounter: Payer: Self-pay | Admitting: Internal Medicine

## 2012-09-21 VITALS — BP 120/70 | HR 72 | Temp 97.7°F | Resp 16 | Wt 148.0 lb

## 2012-09-21 DIAGNOSIS — R030 Elevated blood-pressure reading, without diagnosis of hypertension: Secondary | ICD-10-CM

## 2012-09-21 DIAGNOSIS — F17200 Nicotine dependence, unspecified, uncomplicated: Secondary | ICD-10-CM

## 2012-09-21 DIAGNOSIS — F329 Major depressive disorder, single episode, unspecified: Secondary | ICD-10-CM

## 2012-09-21 DIAGNOSIS — F411 Generalized anxiety disorder: Secondary | ICD-10-CM

## 2012-09-21 DIAGNOSIS — E785 Hyperlipidemia, unspecified: Secondary | ICD-10-CM

## 2012-09-21 MED ORDER — OXYCODONE-ACETAMINOPHEN 10-325 MG PO TABS: 1.0000 | ORAL_TABLET | Freq: Four times a day (QID) | ORAL | Status: DC | PRN

## 2012-09-21 MED ORDER — ALPRAZOLAM 1 MG PO TABS: 1.0000 mg | ORAL_TABLET | Freq: Three times a day (TID) | ORAL | Status: DC | PRN

## 2012-09-21 MED ORDER — TEMAZEPAM 15 MG PO CAPS: 15.0000 mg | ORAL_CAPSULE | Freq: Every evening | ORAL | Status: DC | PRN

## 2012-09-21 MED ORDER — HYDROCHLOROTHIAZIDE 12.5 MG PO CAPS: 12.5000 mg | ORAL_CAPSULE | Freq: Every day | ORAL | Status: DC

## 2012-09-21 NOTE — Assessment & Plan Note (Signed)
Continue with current prescription therapy as reflected on the Med list.  

## 2012-09-21 NOTE — Progress Notes (Signed)
Subjective:    Patient ID: Theresa Aguirre, female    DOB: 09/04/1946, 66 y.o.   MRN: [id number]  Hypertension This is a new problem. The current episode started more than 1 month ago. Associated symptoms include anxiety and headaches. Pertinent negatives include no blurred vision or palpitations. Past treatments include nothing.  Back Pain This is a chronic problem. The problem has been waxing and waning since onset. The pain is present in the sacro-iliac and lumbar spine. The quality of the pain is described as aching. The pain is at a severity of 7/10. The pain is severe. The pain is worse during the day. The symptoms are aggravated by bending and position. Associated symptoms include headaches. Pertinent negatives include no abdominal pain, numbness or weakness.  Anxiety Presents for follow-up visit. Symptoms include decreased concentration and nervous/anxious behavior. Patient reports no confusion, dizziness, nausea, palpitations or suicidal ideas. Symptoms occur most days. The quality of sleep is fair. Nighttime awakenings: occasional.   Compliance with medications is 26-50%.    The patient presents for a follow-up of  COPD, chronic anxiety, chronic dyslipidemia, depression controlled with medicines. She quit smoking 1/13, restarted in 5/13 due to stress.   BP Readings from Last 3 Encounters:  09/21/12 120/70  08/08/12 140/80  06/13/12 98/58   Wt Readings from Last 3 Encounters:  09/21/12 148 lb (67.132 kg)  08/08/12 151 lb (68.493 kg)  06/13/12 148 lb (67.132 kg)      Review of Systems  Constitutional: Negative for chills, activity change, appetite change, fatigue and unexpected weight change.  HENT: Negative for congestion, mouth sores and sinus pressure.   Eyes: Negative for blurred vision and visual disturbance.  Respiratory: Negative for cough and chest tightness.   Cardiovascular: Negative for palpitations.  Gastrointestinal: Negative for nausea and abdominal pain.   Genitourinary: Negative for frequency, difficulty urinating and vaginal pain.  Musculoskeletal: Positive for back pain. Negative for gait problem.  Skin: Negative for pallor and rash.  Neurological: Positive for headaches. Negative for dizziness, tremors, weakness and numbness.  Psychiatric/Behavioral: Positive for sleep disturbance and decreased concentration. Negative for suicidal ideas and confusion. The patient is nervous/anxious.        Objective:   Physical Exam  Constitutional: She appears well-developed. No distress.  HENT:  Head: Normocephalic.  Right Ear: External ear normal.  Left Ear: External ear normal.  Nose: Nose normal.  Mouth/Throat: Oropharynx is clear and moist.  Eyes: Conjunctivae normal are normal. Pupils are equal, round, and reactive to light. Right eye exhibits no discharge. Left eye exhibits no discharge.  Neck: Normal range of motion. Neck supple. No JVD present. No tracheal deviation present. No thyromegaly present.  Cardiovascular: Normal rate, regular rhythm and normal heart sounds.   Pulmonary/Chest: No stridor. No respiratory distress. She has no wheezes.  Abdominal: Soft. Bowel sounds are normal. She exhibits no distension and no mass. There is no tenderness. There is no rebound and no guarding.  Musculoskeletal: She exhibits no edema and no tenderness.  Lymphadenopathy:    She has no cervical adenopathy.  Neurological: She displays normal reflexes. No cranial nerve deficit. She exhibits normal muscle tone. Coordination normal.  Skin: No rash noted. No erythema.  Psychiatric: Her behavior is normal. Judgment and thought content normal.  L posterolat ribs area is non-tender to palp  Lab Results  Component Value Date   WBC 6.5 06/28/2011   HGB 16.0* 06/28/2011   HCT 47.6* 06/28/2011   PLT 187.0 06/28/2011   GLUCOSE 88  06/28/2011   CHOL 241* 06/28/2011   TRIG 193.0* 06/28/2011   HDL 49.20 06/28/2011   LDLDIRECT 162.8 06/28/2011   ALT 18 06/28/2011   AST  23 06/28/2011   NA 143 06/28/2011   K 4.2 06/28/2011   CL 103 06/28/2011   CREATININE 0.7 06/28/2011   BUN 10 06/28/2011   CO2 31 06/28/2011   TSH 1.53 06/28/2011   HGBA1C 5.6 01/02/2008          Assessment & Plan:

## 2012-09-21 NOTE — Assessment & Plan Note (Signed)
5 cigs/d

## 2012-09-21 NOTE — Assessment & Plan Note (Signed)
better 

## 2012-09-23 ENCOUNTER — Encounter: Payer: Self-pay | Admitting: Internal Medicine

## 2012-10-24 ENCOUNTER — Other Ambulatory Visit: Payer: Self-pay | Admitting: *Deleted

## 2012-10-24 MED ORDER — OXYCODONE-ACETAMINOPHEN 10-325 MG PO TABS: 1.0000 | ORAL_TABLET | Freq: Four times a day (QID) | ORAL | Status: DC | PRN

## 2012-10-24 NOTE — Telephone Encounter (Signed)
Percocet was given  OK to fill the others - pls call in Thx

## 2012-10-24 NOTE — Telephone Encounter (Signed)
Pt req Rf on Prednisone, Alprazolam and Temazepam. Please advise.

## 2012-10-24 NOTE — Telephone Encounter (Signed)
Needs percocet ok

## 2012-10-25 MED ORDER — TEMAZEPAM 15 MG PO CAPS: 15.0000 mg | ORAL_CAPSULE | Freq: Every evening | ORAL | Status: DC | PRN

## 2012-10-25 MED ORDER — PREDNISONE 10 MG PO TABS: ORAL_TABLET | ORAL | Status: DC

## 2012-10-25 MED ORDER — ALPRAZOLAM 1 MG PO TABS: 1.0000 mg | ORAL_TABLET | Freq: Three times a day (TID) | ORAL | Status: DC | PRN

## 2012-10-25 NOTE — Telephone Encounter (Signed)
Done

## 2012-11-15 ENCOUNTER — Other Ambulatory Visit: Payer: Self-pay | Admitting: Internal Medicine

## 2012-12-13 ENCOUNTER — Telehealth: Payer: Self-pay | Admitting: Internal Medicine

## 2012-12-13 MED ORDER — AZITHROMYCIN 250 MG PO TABS: ORAL_TABLET | ORAL | Status: DC

## 2012-12-13 NOTE — Telephone Encounter (Signed)
Patient refused appt for today, she just wants something called in for sinus infection to [facility], please advise?

## 2012-12-13 NOTE — Telephone Encounter (Signed)
Pt informed

## 2012-12-13 NOTE — Telephone Encounter (Signed)
Zpac - done. She can pick up the Rx Thx

## 2012-12-19 ENCOUNTER — Ambulatory Visit: Payer: Medicare Other | Admitting: Internal Medicine

## 2012-12-27 ENCOUNTER — Encounter: Payer: Self-pay | Admitting: Internal Medicine

## 2012-12-27 ENCOUNTER — Ambulatory Visit (INDEPENDENT_AMBULATORY_CARE_PROVIDER_SITE_OTHER): Payer: BC Managed Care – PPO | Admitting: Internal Medicine

## 2012-12-27 VITALS — BP 128/80 | HR 96 | Temp 97.5°F | Wt 141.4 lb

## 2012-12-27 DIAGNOSIS — G459 Transient cerebral ischemic attack, unspecified: Secondary | ICD-10-CM

## 2012-12-27 DIAGNOSIS — G500 Trigeminal neuralgia: Secondary | ICD-10-CM

## 2012-12-27 DIAGNOSIS — E785 Hyperlipidemia, unspecified: Secondary | ICD-10-CM

## 2012-12-27 DIAGNOSIS — J449 Chronic obstructive pulmonary disease, unspecified: Secondary | ICD-10-CM

## 2012-12-27 DIAGNOSIS — M129 Arthropathy, unspecified: Secondary | ICD-10-CM

## 2012-12-27 DIAGNOSIS — F17200 Nicotine dependence, unspecified, uncomplicated: Secondary | ICD-10-CM

## 2012-12-27 DIAGNOSIS — F411 Generalized anxiety disorder: Secondary | ICD-10-CM

## 2012-12-27 DIAGNOSIS — F329 Major depressive disorder, single episode, unspecified: Secondary | ICD-10-CM

## 2012-12-27 DIAGNOSIS — K219 Gastro-esophageal reflux disease without esophagitis: Secondary | ICD-10-CM

## 2012-12-27 MED ORDER — ROFLUMILAST 500 MCG PO TABS: 500.0000 ug | ORAL_TABLET | Freq: Every day | ORAL | Status: DC

## 2012-12-27 MED ORDER — OXYCODONE-ACETAMINOPHEN 10-325 MG PO TABS: 1.0000 | ORAL_TABLET | Freq: Four times a day (QID) | ORAL | Status: DC | PRN

## 2012-12-27 MED ORDER — ASPIRIN 325 MG PO TABS: 325.0000 mg | ORAL_TABLET | Freq: Every day | ORAL | Status: DC

## 2012-12-27 MED ORDER — TEMAZEPAM 15 MG PO CAPS: 15.0000 mg | ORAL_CAPSULE | Freq: Every evening | ORAL | Status: DC | PRN

## 2012-12-27 MED ORDER — ALPRAZOLAM 1 MG PO TABS: 1.0000 mg | ORAL_TABLET | Freq: Three times a day (TID) | ORAL | Status: DC | PRN

## 2012-12-27 NOTE — Assessment & Plan Note (Signed)
Continue with current prescription therapy as reflected on the Med list.  

## 2012-12-27 NOTE — Progress Notes (Signed)
Subjective:     Hypertension This is a new problem. The current episode started more than 1 month ago. Associated symptoms include anxiety and headaches. Pertinent negatives include no blurred vision or palpitations. Past treatments include nothing.  Back Pain This is a chronic problem. The problem has been waxing and waning since onset. The pain is present in the sacro-iliac and lumbar spine. The quality of the pain is described as aching. The pain is at a severity of 7/10. The pain is severe. The pain is worse during the day. The symptoms are aggravated by bending and position. Associated symptoms include headaches. Pertinent negatives include no abdominal pain, numbness or weakness.  Anxiety Presents for follow-up visit. Symptoms include decreased concentration and nervous/anxious behavior. Patient reports no confusion, dizziness, nausea, palpitations or suicidal ideas. Symptoms occur most days. The quality of sleep is fair. Nighttime awakenings: occasional.   Compliance with medications is 26-50%.   Can't walk far due to knee and LBP C/o R facial numbness episodes x1-2 h The patient presents for a follow-up of  COPD, chronic anxiety, chronic dyslipidemia, depression controlled with medicines. She quit smoking 1/13, restarted in 5/13 due to stress.   BP Readings from Last 3 Encounters:  12/27/12 128/80  09/21/12 120/70  08/08/12 140/80   Wt Readings from Last 3 Encounters:  12/27/12 141 lb 6.4 oz (64.139 kg)  09/21/12 148 lb (67.132 kg)  08/08/12 151 lb (68.493 kg)      Review of Systems  Constitutional: Negative for chills, activity change, appetite change, fatigue and unexpected weight change.  HENT: Negative for congestion, mouth sores and sinus pressure.   Eyes: Negative for blurred vision and visual disturbance.  Respiratory: Negative for cough and chest tightness.   Cardiovascular: Negative for palpitations.  Gastrointestinal: Negative for nausea and abdominal pain.   Genitourinary: Negative for frequency, difficulty urinating and vaginal pain.  Musculoskeletal: Positive for back pain. Negative for gait problem.  Skin: Negative for pallor and rash.  Neurological: Positive for headaches. Negative for dizziness, tremors, weakness and numbness.  Psychiatric/Behavioral: Positive for sleep disturbance and decreased concentration. Negative for suicidal ideas and confusion. The patient is nervous/anxious.        Objective:   Physical Exam  Constitutional: She appears well-developed. No distress.  HENT:  Head: Normocephalic.  Right Ear: External ear normal.  Left Ear: External ear normal.  Nose: Nose normal.  Mouth/Throat: Oropharynx is clear and moist.  Eyes: Conjunctivae are normal. Pupils are equal, round, and reactive to light. Right eye exhibits no discharge. Left eye exhibits no discharge.  Neck: Normal range of motion. Neck supple. No JVD present. No tracheal deviation present. No thyromegaly present.  Cardiovascular: Normal rate, regular rhythm and normal heart sounds.   Pulmonary/Chest: No stridor. No respiratory distress. She has no wheezes.  Abdominal: Soft. Bowel sounds are normal. She exhibits no distension and no mass. There is no tenderness. There is no rebound and no guarding.  Musculoskeletal: She exhibits no edema and no tenderness.  Lymphadenopathy:    She has no cervical adenopathy.  Neurological: She displays normal reflexes. No cranial nerve deficit. She exhibits normal muscle tone. Coordination normal.  Skin: No rash noted. No erythema.  Psychiatric: Her behavior is normal. Judgment and thought content normal.  L posterolat ribs area is non-tender to palp LS is tender  Lab Results  Component Value Date   WBC 6.5 06/28/2011   HGB 16.0* 06/28/2011   HCT 47.6* 06/28/2011   PLT 187.0 06/28/2011  GLUCOSE 88 06/28/2011   CHOL 241* 06/28/2011   TRIG 193.0* 06/28/2011   HDL 49.20 06/28/2011   LDLDIRECT 162.8 06/28/2011   ALT 18 06/28/2011    AST 23 06/28/2011   NA 143 06/28/2011   K 4.2 06/28/2011   CL 103 06/28/2011   CREATININE 0.7 06/28/2011   BUN 10 06/28/2011   CO2 31 06/28/2011   TSH 1.53 06/28/2011   HGBA1C 5.6 01/02/2008          Assessment & Plan:

## 2012-12-27 NOTE — Assessment & Plan Note (Signed)
Recovered  

## 2012-12-27 NOTE — Assessment & Plan Note (Signed)
Trying to quit 

## 2012-12-31 ENCOUNTER — Other Ambulatory Visit: Payer: Self-pay | Admitting: *Deleted

## 2012-12-31 DIAGNOSIS — G459 Transient cerebral ischemic attack, unspecified: Secondary | ICD-10-CM

## 2013-01-11 ENCOUNTER — Telehealth: Payer: Self-pay

## 2013-01-11 NOTE — Telephone Encounter (Signed)
Pt called requesting a return call from AVP only. Pt refused to discuss nature of request with triage.

## 2013-01-11 NOTE — Telephone Encounter (Signed)
Wt loss, chills on Daliresp - will d/c. She is aware

## 2013-01-20 ENCOUNTER — Other Ambulatory Visit: Payer: Self-pay | Admitting: Internal Medicine

## 2013-01-28 ENCOUNTER — Ambulatory Visit
Admission: RE | Admit: 2013-01-28 | Discharge: 2013-01-28 | Disposition: A | Discharge status: Home / Self Care | Payer: Medicare Other | Source: Ambulatory Visit | Attending: Internal Medicine | Admitting: Internal Medicine

## 2013-01-28 DIAGNOSIS — G459 Transient cerebral ischemic attack, unspecified: Secondary | ICD-10-CM

## 2013-01-28 MED ORDER — GADOBENATE DIMEGLUMINE 529 MG/ML IV SOLN
13.0000 mL | Freq: Once | INTRAVENOUS | Status: AC | PRN
  Administered 2013-01-28: 13 mL via INTRAVENOUS

## 2013-02-02 ENCOUNTER — Telehealth: Payer: Self-pay | Admitting: Internal Medicine

## 2013-02-02 DIAGNOSIS — D490 Neoplasm of unspecified behavior of digestive system: Secondary | ICD-10-CM

## 2013-02-02 NOTE — Telephone Encounter (Signed)
Message copied by Tresa Garter on Sat Feb 02, 2013 12:01 AM ------      Message from: Merrilyn Puma      Created: Tue Jan 29, 2013 11:53 AM       Pt informed- she wants ENT referral. ------

## 2013-02-02 NOTE — Telephone Encounter (Signed)
ENT ref 

## 2013-02-13 ENCOUNTER — Encounter (INDEPENDENT_AMBULATORY_CARE_PROVIDER_SITE_OTHER): Payer: Medicare Other

## 2013-02-13 DIAGNOSIS — R209 Unspecified disturbances of skin sensation: Secondary | ICD-10-CM

## 2013-02-13 DIAGNOSIS — G459 Transient cerebral ischemic attack, unspecified: Secondary | ICD-10-CM

## 2013-02-18 ENCOUNTER — Encounter: Payer: Self-pay | Admitting: Internal Medicine

## 2013-02-18 ENCOUNTER — Ambulatory Visit (INDEPENDENT_AMBULATORY_CARE_PROVIDER_SITE_OTHER): Payer: Medicare Other | Admitting: Internal Medicine

## 2013-02-18 VITALS — BP 112/80 | HR 80 | Temp 97.3°F | Resp 16 | Wt 142.0 lb

## 2013-02-18 DIAGNOSIS — K219 Gastro-esophageal reflux disease without esophagitis: Secondary | ICD-10-CM

## 2013-02-18 DIAGNOSIS — D490 Neoplasm of unspecified behavior of digestive system: Secondary | ICD-10-CM

## 2013-02-18 DIAGNOSIS — F411 Generalized anxiety disorder: Secondary | ICD-10-CM

## 2013-02-18 DIAGNOSIS — G500 Trigeminal neuralgia: Secondary | ICD-10-CM

## 2013-02-18 DIAGNOSIS — F329 Major depressive disorder, single episode, unspecified: Secondary | ICD-10-CM

## 2013-02-18 DIAGNOSIS — M949 Disorder of cartilage, unspecified: Secondary | ICD-10-CM

## 2013-02-18 DIAGNOSIS — F17200 Nicotine dependence, unspecified, uncomplicated: Secondary | ICD-10-CM

## 2013-02-18 DIAGNOSIS — I6529 Occlusion and stenosis of unspecified carotid artery: Secondary | ICD-10-CM | POA: Insufficient documentation

## 2013-02-18 DIAGNOSIS — R030 Elevated blood-pressure reading, without diagnosis of hypertension: Secondary | ICD-10-CM

## 2013-02-18 DIAGNOSIS — E785 Hyperlipidemia, unspecified: Secondary | ICD-10-CM

## 2013-02-18 DIAGNOSIS — M542 Cervicalgia: Secondary | ICD-10-CM

## 2013-02-18 DIAGNOSIS — F3289 Other specified depressive episodes: Secondary | ICD-10-CM

## 2013-02-18 DIAGNOSIS — M899 Disorder of bone, unspecified: Secondary | ICD-10-CM

## 2013-02-18 MED ORDER — OXYCODONE-ACETAMINOPHEN 10-325 MG PO TABS: 1.0000 | ORAL_TABLET | Freq: Four times a day (QID) | ORAL | Status: DC | PRN

## 2013-02-18 MED ORDER — ALPRAZOLAM 1 MG PO TABS: 1.0000 mg | ORAL_TABLET | Freq: Three times a day (TID) | ORAL | Status: DC | PRN

## 2013-02-18 MED ORDER — TEMAZEPAM 15 MG PO CAPS: 15.0000 mg | ORAL_CAPSULE | Freq: Every evening | ORAL | Status: DC | PRN

## 2013-02-18 MED ORDER — RANITIDINE HCL 300 MG PO TABS: 300.0000 mg | ORAL_TABLET | Freq: Every day | ORAL | Status: DC

## 2013-02-18 MED ORDER — OMEPRAZOLE 40 MG PO CPDR: 40.0000 mg | DELAYED_RELEASE_CAPSULE | Freq: Every day | ORAL | Status: DC

## 2013-02-18 MED ORDER — ROSUVASTATIN CALCIUM 5 MG PO TABS: ORAL_TABLET | ORAL | Status: DC

## 2013-02-18 NOTE — Assessment & Plan Note (Signed)
R - likely benign Dr Pollyann Kennedy checked it out before 5/14 pt stated - no change. She declined ENT appt MRI reviewed

## 2013-02-18 NOTE — Assessment & Plan Note (Signed)
Vit D 

## 2013-02-18 NOTE — Assessment & Plan Note (Signed)
Will try Crestor 5 mg 1/2 tab every 3 days

## 2013-02-18 NOTE — Assessment & Plan Note (Signed)
Continue with current prescription therapy as reflected on the Med list.  

## 2013-02-18 NOTE — Progress Notes (Signed)
Subjective:     Hypertension This is a new problem. The current episode started more than 1 month ago. Associated symptoms include anxiety and headaches. Pertinent negatives include no blurred vision or palpitations. Past treatments include nothing.  Back Pain This is a chronic problem. The problem has been waxing and waning since onset. The pain is present in the sacro-iliac and lumbar spine. The quality of the pain is described as aching. The pain is at a severity of 7/10. The pain is severe. The pain is worse during the day. The symptoms are aggravated by bending and position. Associated symptoms include headaches. Pertinent negatives include no abdominal pain, numbness or weakness.  Anxiety Presents for follow-up visit. Symptoms include decreased concentration and nervous/anxious behavior. Patient reports no confusion, dizziness, nausea, palpitations or suicidal ideas. Symptoms occur most days. The quality of sleep is fair. Nighttime awakenings: occasional.   Compliance with medications is 26-50%.   Can't walk far due to knee and LBP C/o wt loss and arthralgias w/Daliresp - she stopped it 1-2 mo ago - better C/o R facial numbness episodes x1-2 h The patient presents for a follow-up of  COPD, chronic anxiety, chronic dyslipidemia, depression controlled with medicines. She quit smoking 1/13, restarted in 5/13 due to stress.   BP Readings from Last 3 Encounters:  02/18/13 112/80  12/27/12 128/80  09/21/12 120/70   Wt Readings from Last 3 Encounters:  02/18/13 142 lb (64.411 kg)  12/27/12 141 lb 6.4 oz (64.139 kg)  09/21/12 148 lb (67.132 kg)      Review of Systems  Constitutional: Negative for chills, activity change, appetite change, fatigue and unexpected weight change.  HENT: Negative for congestion, mouth sores and sinus pressure.   Eyes: Negative for blurred vision and visual disturbance.  Respiratory: Negative for cough and chest tightness.   Cardiovascular: Negative for  palpitations.  Gastrointestinal: Negative for nausea and abdominal pain.  Genitourinary: Negative for frequency, difficulty urinating and vaginal pain.  Musculoskeletal: Positive for back pain. Negative for gait problem.  Skin: Negative for pallor and rash.  Neurological: Positive for headaches. Negative for dizziness, tremors, weakness and numbness.  Psychiatric/Behavioral: Positive for sleep disturbance and decreased concentration. Negative for suicidal ideas and confusion. The patient is nervous/anxious.        Objective:   Physical Exam  Constitutional: She appears well-developed. No distress.  HENT:  Head: Normocephalic.  Right Ear: External ear normal.  Left Ear: External ear normal.  Nose: Nose normal.  Mouth/Throat: Oropharynx is clear and moist.  Eyes: Conjunctivae are normal. Pupils are equal, round, and reactive to light. Right eye exhibits no discharge. Left eye exhibits no discharge.  Neck: Normal range of motion. Neck supple. No JVD present. No tracheal deviation present. No thyromegaly present.  Cardiovascular: Normal rate, regular rhythm and normal heart sounds.   Pulmonary/Chest: No stridor. No respiratory distress. She has no wheezes.  Abdominal: Soft. Bowel sounds are normal. She exhibits no distension and no mass. There is no tenderness. There is no rebound and no guarding.  Musculoskeletal: She exhibits no edema and no tenderness.  Lymphadenopathy:    She has no cervical adenopathy.  Neurological: She displays normal reflexes. No cranial nerve deficit. She exhibits normal muscle tone. Coordination normal.  Skin: No rash noted. No erythema.  Psychiatric: Her behavior is normal. Judgment and thought content normal.  L posterolat ribs area is non-tender to palp LS is tender  Lab Results  Component Value Date   WBC 6.5 06/28/2011  HGB 16.0* 06/28/2011   HCT 47.6* 06/28/2011   PLT 187.0 06/28/2011   GLUCOSE 88 06/28/2011   CHOL 241* 06/28/2011   TRIG 193.0*  06/28/2011   HDL 49.20 06/28/2011   LDLDIRECT 162.8 06/28/2011   ALT 18 06/28/2011   AST 23 06/28/2011   NA 143 06/28/2011   K 4.2 06/28/2011   CL 103 06/28/2011   CREATININE 0.7 06/28/2011   BUN 10 06/28/2011   CO2 31 06/28/2011   TSH 1.53 06/28/2011   HGBA1C 5.6 01/02/2008          Assessment & Plan:

## 2013-02-18 NOTE — Assessment & Plan Note (Signed)
Better  

## 2013-02-18 NOTE — Assessment & Plan Note (Signed)
Zantac qd Prilosec prn

## 2013-02-18 NOTE — Assessment & Plan Note (Signed)
Not doing too well. Psychol ref offered Continue with current prescription therapy as reflected on the Med list.

## 2013-02-18 NOTE — Assessment & Plan Note (Signed)
Refractory 5/14 on ecig

## 2013-02-18 NOTE — Assessment & Plan Note (Signed)
Start Crestor Cont ASA [place] in 6 mo She stopped smoking. E sig - a little

## 2013-02-18 NOTE — Assessment & Plan Note (Signed)
BP Readings from Last 3 Encounters:  02/18/13 112/80  12/27/12 128/80  09/21/12 120/70

## 2013-04-24 ENCOUNTER — Encounter: Payer: Self-pay | Admitting: Internal Medicine

## 2013-04-24 ENCOUNTER — Ambulatory Visit (INDEPENDENT_AMBULATORY_CARE_PROVIDER_SITE_OTHER): Payer: Medicare Other | Admitting: Internal Medicine

## 2013-04-24 ENCOUNTER — Other Ambulatory Visit (INDEPENDENT_AMBULATORY_CARE_PROVIDER_SITE_OTHER): Payer: Medicare Other

## 2013-04-24 VITALS — BP 138/80 | HR 76 | Temp 97.7°F | Resp 16 | Wt 142.0 lb

## 2013-04-24 DIAGNOSIS — M899 Disorder of bone, unspecified: Secondary | ICD-10-CM

## 2013-04-24 DIAGNOSIS — F3289 Other specified depressive episodes: Secondary | ICD-10-CM

## 2013-04-24 DIAGNOSIS — M542 Cervicalgia: Secondary | ICD-10-CM

## 2013-04-24 DIAGNOSIS — M949 Disorder of cartilage, unspecified: Secondary | ICD-10-CM

## 2013-04-24 DIAGNOSIS — F17200 Nicotine dependence, unspecified, uncomplicated: Secondary | ICD-10-CM

## 2013-04-24 DIAGNOSIS — F411 Generalized anxiety disorder: Secondary | ICD-10-CM

## 2013-04-24 DIAGNOSIS — F329 Major depressive disorder, single episode, unspecified: Secondary | ICD-10-CM

## 2013-04-24 DIAGNOSIS — E785 Hyperlipidemia, unspecified: Secondary | ICD-10-CM

## 2013-04-24 DIAGNOSIS — R030 Elevated blood-pressure reading, without diagnosis of hypertension: Secondary | ICD-10-CM

## 2013-04-24 DIAGNOSIS — J449 Chronic obstructive pulmonary disease, unspecified: Secondary | ICD-10-CM

## 2013-04-24 DIAGNOSIS — K219 Gastro-esophageal reflux disease without esophagitis: Secondary | ICD-10-CM

## 2013-04-24 LAB — LIPID PANEL
Total CHOL/HDL Ratio: 5
VLDL: 29.8 mg/dL (ref 0.0–40.0)

## 2013-04-24 LAB — BASIC METABOLIC PANEL
BUN: 12 mg/dL (ref 6–23)
Chloride: 104 mEq/L (ref 96–112)
Creatinine, Ser: 0.8 mg/dL (ref 0.4–1.2)
GFR: 79.31 mL/min (ref >60.00)

## 2013-04-24 LAB — HEPATIC FUNCTION PANEL
ALT: 16 U/L (ref 0–35)
Bilirubin, Direct: 0.1 mg/dL (ref 0.0–0.3)
Total Bilirubin: 0.7 mg/dL (ref 0.3–1.2)

## 2013-04-24 MED ORDER — RANITIDINE HCL 300 MG PO TABS: 300.0000 mg | ORAL_TABLET | Freq: Every day | ORAL | Status: DC

## 2013-04-24 MED ORDER — OXYCODONE-ACETAMINOPHEN 10-325 MG PO TABS: 1.0000 | ORAL_TABLET | Freq: Four times a day (QID) | ORAL | Status: DC | PRN

## 2013-04-24 MED ORDER — ALPRAZOLAM 1 MG PO TABS: 1.0000 mg | ORAL_TABLET | Freq: Three times a day (TID) | ORAL | Status: DC | PRN

## 2013-04-24 MED ORDER — TEMAZEPAM 15 MG PO CAPS: 15.0000 mg | ORAL_CAPSULE | Freq: Every evening | ORAL | Status: DC | PRN

## 2013-04-24 MED ORDER — AZITHROMYCIN 250 MG PO TABS: ORAL_TABLET | ORAL | Status: DC

## 2013-04-24 NOTE — Progress Notes (Signed)
Patient ID: Theresa Aguirre, female   DOB: 1945-11-09, 67 y.o.   MRN: [id number]   Subjective:     Hypertension This is a chronic problem. The current episode started more than 1 year ago. Associated symptoms include anxiety and headaches. Pertinent negatives include no blurred vision or palpitations. Past treatments include nothing.  Back Pain This is a chronic problem. The problem has been waxing and waning since onset. The pain is present in the sacro-iliac and lumbar spine. The quality of the pain is described as aching. The pain is at a severity of 7/10. The pain is severe. The pain is worse during the day. The symptoms are aggravated by bending and position. Associated symptoms include headaches. Pertinent negatives include no abdominal pain, numbness or weakness.  Anxiety Presents for follow-up visit. Symptoms include decreased concentration and nervous/anxious behavior. Patient reports no confusion, dizziness, nausea, palpitations or suicidal ideas. Symptoms occur most days. The quality of sleep is fair. Nighttime awakenings: occasional.   Compliance with medications is 26-50%.   Can't walk far due to knee and LBP F/u wt loss and arthralgias w/Daliresp - she stopped it 1-2 mo ago - resolved F/u R facial numbness episodes x1-2 h - no relapse The patient presents for a follow-up of  COPD, chronic anxiety, chronic dyslipidemia, depression controlled with medicines. She quit smoking 1/13, restarted in 5/13 due to stress.   BP Readings from Last 3 Encounters:  04/24/13 138/80  02/18/13 112/80  12/27/12 128/80   Wt Readings from Last 3 Encounters:  04/24/13 142 lb (64.411 kg)  02/18/13 142 lb (64.411 kg)  12/27/12 141 lb 6.4 oz (64.139 kg)      Review of Systems  Constitutional: Negative for chills, activity change, appetite change, fatigue and unexpected weight change.  HENT: Negative for congestion, mouth sores and sinus pressure.   Eyes: Negative for blurred vision and visual  disturbance.  Respiratory: Negative for cough and chest tightness.   Cardiovascular: Negative for palpitations.  Gastrointestinal: Negative for nausea and abdominal pain.  Genitourinary: Negative for frequency, difficulty urinating and vaginal pain.  Musculoskeletal: Positive for back pain. Negative for gait problem.  Skin: Negative for pallor and rash.  Neurological: Positive for headaches. Negative for dizziness, tremors, weakness and numbness.  Psychiatric/Behavioral: Positive for sleep disturbance and decreased concentration. Negative for suicidal ideas and confusion. The patient is nervous/anxious.        Objective:   Physical Exam  Constitutional: She appears well-developed. No distress.  HENT:  Head: Normocephalic.  Right Ear: External ear normal.  Left Ear: External ear normal.  Nose: Nose normal.  Mouth/Throat: Oropharynx is clear and moist.  Eyes: Conjunctivae are normal. Pupils are equal, round, and reactive to light. Right eye exhibits no discharge. Left eye exhibits no discharge.  Neck: Normal range of motion. Neck supple. No JVD present. No tracheal deviation present. No thyromegaly present.  Cardiovascular: Normal rate, regular rhythm and normal heart sounds.   Pulmonary/Chest: No stridor. No respiratory distress. She has no wheezes.  Abdominal: Soft. Bowel sounds are normal. She exhibits no distension and no mass. There is no tenderness. There is no rebound and no guarding.  Musculoskeletal: She exhibits no edema and no tenderness.  Lymphadenopathy:    She has no cervical adenopathy.  Neurological: She displays normal reflexes. No cranial nerve deficit. She exhibits normal muscle tone. Coordination normal.  Skin: No rash noted. No erythema.  Psychiatric: Her behavior is normal. Judgment and thought content normal.   LS is tender  Lab  Results  Component Value Date   WBC 6.5 06/28/2011   HGB 16.0* 06/28/2011   HCT 47.6* 06/28/2011   PLT 187.0 06/28/2011   GLUCOSE 88  06/28/2011   CHOL 241* 06/28/2011   TRIG 193.0* 06/28/2011   HDL 49.20 06/28/2011   LDLDIRECT 162.8 06/28/2011   ALT 18 06/28/2011   AST 23 06/28/2011   NA 143 06/28/2011   K 4.2 06/28/2011   CL 103 06/28/2011   CREATININE 0.7 06/28/2011   BUN 10 06/28/2011   CO2 31 06/28/2011   TSH 1.53 06/28/2011   HGBA1C 5.6 01/02/2008          Assessment & Plan:

## 2013-04-27 ENCOUNTER — Encounter: Payer: Self-pay | Admitting: Internal Medicine

## 2013-04-27 NOTE — Assessment & Plan Note (Signed)
Continue with current prescription therapy as reflected on the Med list.  

## 2013-04-27 NOTE — Assessment & Plan Note (Signed)
Discussed.

## 2013-04-27 NOTE — Assessment & Plan Note (Signed)
Low fat diet

## 2013-05-07 ENCOUNTER — Telehealth: Payer: Self-pay | Admitting: *Deleted

## 2013-05-07 NOTE — Telephone Encounter (Signed)
Pt called states she has been called for [employer],   Pt is requesting a note excusing her from duty.  She is to report for Jury Duty on 6.7.14.  Please advise

## 2013-05-07 NOTE — Telephone Encounter (Signed)
I need her jury summons w/her jury # Thx

## 2013-05-08 NOTE — Telephone Encounter (Signed)
Spoke with pt, advised her of MDs message.  She will bring a copy of Jury Summons in to clinic

## 2013-05-14 ENCOUNTER — Telehealth: Payer: Self-pay | Admitting: *Deleted

## 2013-05-14 ENCOUNTER — Encounter: Payer: Self-pay | Admitting: *Deleted

## 2013-05-14 NOTE — Telephone Encounter (Signed)
Pt informed her jury excuse letter is ready for p/u.

## 2013-05-15 ENCOUNTER — Other Ambulatory Visit: Payer: Self-pay

## 2013-07-02 ENCOUNTER — Ambulatory Visit (INDEPENDENT_AMBULATORY_CARE_PROVIDER_SITE_OTHER): Payer: Medicare Other | Admitting: Internal Medicine

## 2013-07-02 ENCOUNTER — Encounter: Payer: Self-pay | Admitting: Internal Medicine

## 2013-07-02 VITALS — BP 140/90 | HR 80 | Temp 97.9°F | Resp 16 | Wt 145.0 lb

## 2013-07-02 DIAGNOSIS — R030 Elevated blood-pressure reading, without diagnosis of hypertension: Secondary | ICD-10-CM

## 2013-07-02 DIAGNOSIS — M542 Cervicalgia: Secondary | ICD-10-CM

## 2013-07-02 DIAGNOSIS — F411 Generalized anxiety disorder: Secondary | ICD-10-CM

## 2013-07-02 DIAGNOSIS — F329 Major depressive disorder, single episode, unspecified: Secondary | ICD-10-CM

## 2013-07-02 DIAGNOSIS — M79609 Pain in unspecified limb: Secondary | ICD-10-CM

## 2013-07-02 MED ORDER — PREDNISONE 10 MG PO TABS: ORAL_TABLET | ORAL | Status: DC

## 2013-07-02 MED ORDER — BUPROPION HCL ER (SR) 150 MG PO TB12: 150.0000 mg | ORAL_TABLET | Freq: Two times a day (BID) | ORAL | Status: DC

## 2013-07-02 MED ORDER — OXYCODONE-ACETAMINOPHEN 10-325 MG PO TABS: 1.0000 | ORAL_TABLET | Freq: Four times a day (QID) | ORAL | Status: DC | PRN

## 2013-07-02 MED ORDER — ALPRAZOLAM 1 MG PO TABS: 1.0000 mg | ORAL_TABLET | Freq: Four times a day (QID) | ORAL | Status: DC | PRN

## 2013-07-02 NOTE — Progress Notes (Signed)
Subjective:  C/o neck pain and R shoulder pain   Hypertension This is a chronic problem. The current episode started more than 1 year ago. Associated symptoms include anxiety and headaches. Pertinent negatives include no blurred vision or palpitations. Past treatments include nothing.  Back Pain This is a chronic problem. The problem has been waxing and waning since onset. The pain is present in the sacro-iliac and lumbar spine. The quality of the pain is described as aching. The pain is at a severity of 7/10. The pain is severe. The pain is worse during the day. The symptoms are aggravated by bending and position. Associated symptoms include headaches. Pertinent negatives include no abdominal pain, numbness or weakness.  Anxiety Presents for follow-up visit. Symptoms include decreased concentration and nervous/anxious behavior. Patient reports no confusion, dizziness, nausea, palpitations or suicidal ideas. Symptoms occur most days. The quality of sleep is fair. Nighttime awakenings: occasional.   Compliance with medications is 26-50%.   Can't walk far due to knee and LBP F/u wt loss and arthralgias w/Daliresp - she stopped it 1-2 mo ago - resolved F/u R facial numbness episodes x1-2 h - no relapse The patient presents for a follow-up of  COPD, chronic anxiety, chronic dyslipidemia, depression controlled with medicines. She quit smoking 1/13, restarted in 5/13 due to stress.   BP Readings from Last 3 Encounters:  07/02/13 140/90  04/24/13 138/80  02/18/13 112/80   Wt Readings from Last 3 Encounters:  07/02/13 145 lb (65.772 kg)  04/24/13 142 lb (64.411 kg)  02/18/13 142 lb (64.411 kg)      Review of Systems  Constitutional: Negative for chills, activity change, appetite change, fatigue and unexpected weight change.  HENT: Negative for congestion, mouth sores and sinus pressure.   Eyes: Negative for blurred vision and visual disturbance.  Respiratory: Negative for cough and  chest tightness.   Cardiovascular: Negative for palpitations.  Gastrointestinal: Negative for nausea and abdominal pain.  Genitourinary: Negative for frequency, difficulty urinating and vaginal pain.  Musculoskeletal: Positive for back pain. Negative for gait problem.  Skin: Negative for pallor and rash.  Neurological: Positive for headaches. Negative for dizziness, tremors, weakness and numbness.  Psychiatric/Behavioral: Positive for sleep disturbance and decreased concentration. Negative for suicidal ideas and confusion. The patient is nervous/anxious.        Objective:   Physical Exam  Constitutional: She appears well-developed. No distress.  HENT:  Head: Normocephalic.  Right Ear: External ear normal.  Left Ear: External ear normal.  Nose: Nose normal.  Mouth/Throat: Oropharynx is clear and moist.  Eyes: Conjunctivae are normal. Pupils are equal, round, and reactive to light. Right eye exhibits no discharge. Left eye exhibits no discharge.  Neck: Normal range of motion. Neck supple. No JVD present. No tracheal deviation present. No thyromegaly present.  Cardiovascular: Normal rate, regular rhythm and normal heart sounds.   Pulmonary/Chest: No stridor. No respiratory distress. She has no wheezes.  Abdominal: Soft. Bowel sounds are normal. She exhibits no distension and no mass. There is no tenderness. There is no rebound and no guarding.  Musculoskeletal: She exhibits no edema and no tenderness.  Lymphadenopathy:    She has no cervical adenopathy.  Neurological: She displays normal reflexes. No cranial nerve deficit. She exhibits normal muscle tone. Coordination normal.  Skin: No rash noted. No erythema.  Psychiatric: Her behavior is normal. Judgment and thought content normal.   LS is tender  Lab Results  Component Value Date   WBC 6.5 06/28/2011  HGB 16.0* 06/28/2011   HCT 47.6* 06/28/2011   PLT 187.0 06/28/2011   GLUCOSE 90 04/24/2013   CHOL 190 04/24/2013   TRIG 149.0  04/24/2013   HDL 41.80 04/24/2013   LDLDIRECT 162.8 06/28/2011   LDLCALC 118* 04/24/2013   ALT 16 04/24/2013   AST 18 04/24/2013   NA 139 04/24/2013   K 4.3 04/24/2013   CL 104 04/24/2013   CREATININE 0.8 04/24/2013   BUN 12 04/24/2013   CO2 29 04/24/2013   TSH 1.53 06/28/2011   HGBA1C 5.6 01/02/2008          Assessment & Plan:

## 2013-07-03 NOTE — Assessment & Plan Note (Signed)
Continue with current prescription therapy as reflected on the Med list.  

## 2013-07-03 NOTE — Assessment & Plan Note (Signed)
Contour pillow Neck traction w/caution Cont Rx

## 2013-07-03 NOTE — Assessment & Plan Note (Signed)
Watching BP BP Readings from Last 3 Encounters:  07/02/13 140/90  04/24/13 138/80  02/18/13 112/80

## 2013-07-31 ENCOUNTER — Ambulatory Visit (INDEPENDENT_AMBULATORY_CARE_PROVIDER_SITE_OTHER): Payer: Medicare Other | Admitting: Internal Medicine

## 2013-07-31 ENCOUNTER — Encounter: Payer: Self-pay | Admitting: Internal Medicine

## 2013-07-31 VITALS — BP 132/84 | HR 62 | Temp 97.1°F | Ht 64.0 in | Wt 147.4 lb

## 2013-07-31 DIAGNOSIS — R7309 Other abnormal glucose: Secondary | ICD-10-CM

## 2013-07-31 DIAGNOSIS — F411 Generalized anxiety disorder: Secondary | ICD-10-CM

## 2013-07-31 DIAGNOSIS — F329 Major depressive disorder, single episode, unspecified: Secondary | ICD-10-CM

## 2013-07-31 DIAGNOSIS — E785 Hyperlipidemia, unspecified: Secondary | ICD-10-CM

## 2013-07-31 DIAGNOSIS — R030 Elevated blood-pressure reading, without diagnosis of hypertension: Secondary | ICD-10-CM

## 2013-07-31 DIAGNOSIS — Z23 Encounter for immunization: Secondary | ICD-10-CM

## 2013-07-31 MED ORDER — OXYCODONE-ACETAMINOPHEN 10-325 MG PO TABS: 1.0000 | ORAL_TABLET | Freq: Four times a day (QID) | ORAL | Status: DC | PRN

## 2013-07-31 MED ORDER — ALPRAZOLAM 1 MG PO TABS: 1.0000 mg | ORAL_TABLET | Freq: Four times a day (QID) | ORAL | Status: DC | PRN

## 2013-07-31 NOTE — Progress Notes (Signed)
Subjective:  F/u neck pain and R shoulder pain   Hypertension This is a chronic problem. The current episode started more than 1 year ago. Associated symptoms include anxiety and headaches. Pertinent negatives include no blurred vision or palpitations. Past treatments include nothing.  Back Pain This is a chronic problem. The problem has been waxing and waning since onset. The pain is present in the sacro-iliac and lumbar spine. The quality of the pain is described as aching. The pain is at a severity of 7/10. The pain is severe. The pain is worse during the day. The symptoms are aggravated by bending and position. Associated symptoms include headaches. Pertinent negatives include no abdominal pain, numbness or weakness.  Anxiety Presents for follow-up visit. Symptoms include decreased concentration and nervous/anxious behavior. Patient reports no confusion, dizziness, nausea, palpitations or suicidal ideas. Symptoms occur most days. The quality of sleep is fair. Nighttime awakenings: occasional.   Compliance with medications is 26-50%.   Can't walk far due to knee and LBP F/u wt loss and arthralgias w/Daliresp - she stopped it 1-2 mo ago - resolved F/u R facial numbness episodes x1-2 h - no relapse The patient presents for a follow-up of  COPD, chronic anxiety, chronic dyslipidemia, depression controlled with medicines. She quit smoking 1/13, restarted in 5/13 due to stress.   BP Readings from Last 3 Encounters:  07/31/13 132/84  07/02/13 140/90  04/24/13 138/80   Wt Readings from Last 3 Encounters:  07/31/13 147 lb 6 oz (66.849 kg)  07/02/13 145 lb (65.772 kg)  04/24/13 142 lb (64.411 kg)      Review of Systems  Constitutional: Negative for chills, activity change, appetite change, fatigue and unexpected weight change.  HENT: Negative for congestion, mouth sores and sinus pressure.   Eyes: Negative for blurred vision and visual disturbance.  Respiratory: Negative for cough and  chest tightness.   Cardiovascular: Negative for palpitations.  Gastrointestinal: Negative for nausea and abdominal pain.  Genitourinary: Negative for frequency, difficulty urinating and vaginal pain.  Musculoskeletal: Positive for back pain. Negative for gait problem.  Skin: Negative for pallor and rash.  Neurological: Positive for headaches. Negative for dizziness, tremors, weakness and numbness.  Psychiatric/Behavioral: Positive for sleep disturbance and decreased concentration. Negative for suicidal ideas and confusion. The patient is nervous/anxious.        Objective:   Physical Exam  Constitutional: She appears well-developed. No distress.  HENT:  Head: Normocephalic.  Right Ear: External ear normal.  Left Ear: External ear normal.  Nose: Nose normal.  Mouth/Throat: Oropharynx is clear and moist.  Eyes: Conjunctivae are normal. Pupils are equal, round, and reactive to light. Right eye exhibits no discharge. Left eye exhibits no discharge.  Neck: Normal range of motion. Neck supple. No JVD present. No tracheal deviation present. No thyromegaly present.  Cardiovascular: Normal rate, regular rhythm and normal heart sounds.   Pulmonary/Chest: No stridor. No respiratory distress. She has no wheezes.  Abdominal: Soft. Bowel sounds are normal. She exhibits no distension and no mass. There is no tenderness. There is no rebound and no guarding.  Musculoskeletal: She exhibits no edema and no tenderness.  Lymphadenopathy:    She has no cervical adenopathy.  Neurological: She displays normal reflexes. No cranial nerve deficit. She exhibits normal muscle tone. Coordination normal.  Skin: No rash noted. No erythema.  Psychiatric: Her behavior is normal. Judgment and thought content normal.   LS is tender  Lab Results  Component Value Date   WBC 6.5 06/28/2011  HGB 16.0* 06/28/2011   HCT 47.6* 06/28/2011   PLT 187.0 06/28/2011   GLUCOSE 90 04/24/2013   CHOL 190 04/24/2013   TRIG 149.0  04/24/2013   HDL 41.80 04/24/2013   LDLDIRECT 162.8 06/28/2011   LDLCALC 118* 04/24/2013   ALT 16 04/24/2013   AST 18 04/24/2013   NA 139 04/24/2013   K 4.3 04/24/2013   CL 104 04/24/2013   CREATININE 0.8 04/24/2013   BUN 12 04/24/2013   CO2 29 04/24/2013   TSH 1.53 06/28/2011   HGBA1C 5.6 01/02/2008          Assessment & Plan:

## 2013-07-31 NOTE — Assessment & Plan Note (Signed)
Mild  Wt Readings from Last 3 Encounters:  07/31/13 147 lb 6 oz (66.849 kg)  07/02/13 145 lb (65.772 kg)  04/24/13 142 lb (64.411 kg)

## 2013-07-31 NOTE — Assessment & Plan Note (Signed)
Better  

## 2013-07-31 NOTE — Assessment & Plan Note (Signed)
Chronic 10/14 depression/apathy Continue with current prescription therapy as reflected on the Med list. Psychol ref

## 2013-07-31 NOTE — Assessment & Plan Note (Signed)
Continue with current prescription therapy as reflected on the Med list.  

## 2013-07-31 NOTE — Assessment & Plan Note (Signed)
Continue with current prescription therapy as reflected on the Med list. Psychol ref

## 2013-09-25 ENCOUNTER — Ambulatory Visit (INDEPENDENT_AMBULATORY_CARE_PROVIDER_SITE_OTHER): Payer: Medicare Other | Admitting: Internal Medicine

## 2013-09-25 ENCOUNTER — Encounter: Payer: Self-pay | Admitting: Internal Medicine

## 2013-09-25 VITALS — BP 138/80 | Temp 97.7°F

## 2013-09-25 DIAGNOSIS — M129 Arthropathy, unspecified: Secondary | ICD-10-CM

## 2013-09-25 DIAGNOSIS — Z23 Encounter for immunization: Secondary | ICD-10-CM

## 2013-09-25 MED ORDER — ALBUTEROL SULFATE HFA 108 (90 BASE) MCG/ACT IN AERS: 2.0000 | INHALATION_SPRAY | Freq: Four times a day (QID) | RESPIRATORY_TRACT | Status: DC | PRN

## 2013-09-25 MED ORDER — OXYCODONE-ACETAMINOPHEN 10-325 MG PO TABS: 1.0000 | ORAL_TABLET | Freq: Four times a day (QID) | ORAL | Status: DC | PRN

## 2013-09-25 MED ORDER — FLUTICASONE PROPIONATE 50 MCG/ACT NA SUSP: 2.0000 | Freq: Every day | NASAL | Status: DC

## 2013-09-25 NOTE — Assessment & Plan Note (Signed)
Labs

## 2013-09-25 NOTE — Progress Notes (Signed)
Pre visit review using our clinic review tool, if applicable. No additional management support is needed unless otherwise documented below in the visit note. 

## 2013-09-26 ENCOUNTER — Encounter: Payer: Self-pay | Admitting: Internal Medicine

## 2013-09-26 NOTE — Progress Notes (Signed)
Subjective:  F/u neck pain and R shoulder pain   Hypertension This is a chronic problem. The current episode started more than 1 year ago. Associated symptoms include anxiety and headaches. Pertinent negatives include no blurred vision or palpitations. Past treatments include nothing.  Back Pain This is a chronic problem. The problem has been waxing and waning since onset. The pain is present in the sacro-iliac and lumbar spine. The quality of the pain is described as aching. The pain is at a severity of 7/10. The pain is severe. The pain is worse during the day. The symptoms are aggravated by bending and position. Associated symptoms include headaches. Pertinent negatives include no abdominal pain, numbness or weakness.  Anxiety Presents for follow-up visit. Symptoms include decreased concentration and nervous/anxious behavior. Patient reports no confusion, dizziness, nausea, palpitations or suicidal ideas. Symptoms occur most days. The quality of sleep is fair. Nighttime awakenings: occasional.   Compliance with medications is 26-50%.   Still can't walk far due to knee and LBP F/u wt loss and arthralgias w/Daliresp - she stopped it 1-2 mo ago - resolved F/u R facial numbness episodes x1-2 h - no relapse The patient presents for a follow-up of  COPD, chronic anxiety, chronic dyslipidemia, depression controlled with medicines. She quit smoking 1/13, restarted in 5/13 due to stress.   BP Readings from Last 3 Encounters:  09/25/13 138/80  07/31/13 132/84  07/02/13 140/90   Wt Readings from Last 3 Encounters:  07/31/13 147 lb 6 oz (66.849 kg)  07/02/13 145 lb (65.772 kg)  04/24/13 142 lb (64.411 kg)      Review of Systems  Constitutional: Negative for chills, activity change, appetite change, fatigue and unexpected weight change.  HENT: Negative for congestion, mouth sores and sinus pressure.   Eyes: Negative for blurred vision and visual disturbance.  Respiratory: Negative for  cough and chest tightness.   Cardiovascular: Negative for palpitations.  Gastrointestinal: Negative for nausea and abdominal pain.  Genitourinary: Negative for frequency, difficulty urinating and vaginal pain.  Musculoskeletal: Positive for back pain. Negative for gait problem.  Skin: Negative for pallor and rash.  Neurological: Positive for headaches. Negative for dizziness, tremors, weakness and numbness.  Psychiatric/Behavioral: Positive for sleep disturbance and decreased concentration. Negative for suicidal ideas and confusion. The patient is nervous/anxious.        Objective:   Physical Exam  Constitutional: She appears well-developed. No distress.  HENT:  Head: Normocephalic.  Right Ear: External ear normal.  Left Ear: External ear normal.  Nose: Nose normal.  Mouth/Throat: Oropharynx is clear and moist.  Eyes: Conjunctivae are normal. Pupils are equal, round, and reactive to light. Right eye exhibits no discharge. Left eye exhibits no discharge.  Neck: Normal range of motion. Neck supple. No JVD present. No tracheal deviation present. No thyromegaly present.  Cardiovascular: Normal rate, regular rhythm and normal heart sounds.   Pulmonary/Chest: No stridor. No respiratory distress. She has no wheezes.  Abdominal: Soft. Bowel sounds are normal. She exhibits no distension and no mass. There is no tenderness. There is no rebound and no guarding.  Musculoskeletal: She exhibits no edema and no tenderness.  Lymphadenopathy:    She has no cervical adenopathy.  Neurological: She displays normal reflexes. No cranial nerve deficit. She exhibits normal muscle tone. Coordination normal.  Skin: No rash noted. No erythema.  Psychiatric: Her behavior is normal. Judgment and thought content normal.   LS is tender  Lab Results  Component Value Date   WBC 6.5  06/28/2011   HGB 16.0* 06/28/2011   HCT 47.6* 06/28/2011   PLT 187.0 06/28/2011   GLUCOSE 90 04/24/2013   CHOL 190 04/24/2013   TRIG  149.0 04/24/2013   HDL 41.80 04/24/2013   LDLDIRECT 162.8 06/28/2011   LDLCALC 118* 04/24/2013   ALT 16 04/24/2013   AST 18 04/24/2013   NA 139 04/24/2013   K 4.3 04/24/2013   CL 104 04/24/2013   CREATININE 0.8 04/24/2013   BUN 12 04/24/2013   CO2 29 04/24/2013   TSH 1.53 06/28/2011   HGBA1C 5.6 01/02/2008          Assessment & Plan:

## 2013-10-07 ENCOUNTER — Other Ambulatory Visit (INDEPENDENT_AMBULATORY_CARE_PROVIDER_SITE_OTHER): Payer: Medicare Other

## 2013-10-07 DIAGNOSIS — M129 Arthropathy, unspecified: Secondary | ICD-10-CM

## 2013-10-07 LAB — URIC ACID: Uric Acid, Serum: 4.5 mg/dL (ref 2.4–7.0)

## 2013-10-07 LAB — BASIC METABOLIC PANEL
CO2: 29 mEq/L (ref 19–32)
Chloride: 104 mEq/L (ref 96–112)
Creatinine, Ser: 0.8 mg/dL (ref 0.4–1.2)
GFR: 76.89 mL/min (ref >60.00)
Glucose, Bld: 77 mg/dL (ref 70–99)
Sodium: 142 mEq/L (ref 135–145)

## 2013-10-29 ENCOUNTER — Telehealth: Payer: Self-pay | Admitting: Internal Medicine

## 2013-10-29 DIAGNOSIS — I6529 Occlusion and stenosis of unspecified carotid artery: Secondary | ICD-10-CM

## 2013-10-29 MED ORDER — OXYCODONE-ACETAMINOPHEN 10-325 MG PO TABS: 1.0000 | ORAL_TABLET | Freq: Four times a day (QID) | ORAL | Status: DC | PRN

## 2013-10-29 NOTE — Telephone Encounter (Signed)
Needs Rxs

## 2013-10-30 ENCOUNTER — Ambulatory Visit (HOSPITAL_COMMUNITY): Payer: Medicare Other | Attending: Internal Medicine

## 2013-10-30 DIAGNOSIS — E785 Hyperlipidemia, unspecified: Secondary | ICD-10-CM | POA: Insufficient documentation

## 2013-10-30 DIAGNOSIS — I658 Occlusion and stenosis of other precerebral arteries: Secondary | ICD-10-CM | POA: Insufficient documentation

## 2013-10-30 DIAGNOSIS — J4489 Other specified chronic obstructive pulmonary disease: Secondary | ICD-10-CM | POA: Insufficient documentation

## 2013-10-30 DIAGNOSIS — F17200 Nicotine dependence, unspecified, uncomplicated: Secondary | ICD-10-CM | POA: Insufficient documentation

## 2013-10-30 DIAGNOSIS — J449 Chronic obstructive pulmonary disease, unspecified: Secondary | ICD-10-CM | POA: Insufficient documentation

## 2013-10-30 DIAGNOSIS — I6529 Occlusion and stenosis of unspecified carotid artery: Secondary | ICD-10-CM | POA: Insufficient documentation

## 2013-11-14 ENCOUNTER — Other Ambulatory Visit: Payer: Self-pay | Admitting: Internal Medicine

## 2013-11-27 ENCOUNTER — Ambulatory Visit: Payer: Medicare Other | Admitting: Internal Medicine

## 2013-11-27 ENCOUNTER — Telehealth: Payer: Self-pay | Admitting: *Deleted

## 2013-11-27 NOTE — Telephone Encounter (Signed)
To[facility]hel[facility] Flake Fax: [phone] From: [facility] Date/ Time: 11/26/2013 11:45 AM Taken By: Osvaldo HumanPercival Spanish Facility: home Patient: Theresa Aguirre DOB: 03/25/1946 Phone: [phone] Reason for Call: ATTENTION:!!! Theresa Aguirre called on Tuesday (November 26, 2013 at 11:42) she asked if the office could call her 1st thing Wednesday morning to see Dr. Alain Marion at 10:30 AM (November 27, 2013). The patient had an Appointment for today. Regarding Appointment: Appt Date: Appt Time: Unknown Provider: Reason:

## 2013-12-05 ENCOUNTER — Ambulatory Visit: Payer: Medicare Other | Admitting: Internal Medicine

## 2013-12-10 ENCOUNTER — Telehealth: Payer: Self-pay | Admitting: Internal Medicine

## 2013-12-10 NOTE — Telephone Encounter (Signed)
[facility] request refill for Temazepam 15 mg. Please advise. Fax # [phone].

## 2013-12-11 MED ORDER — TEMAZEPAM 15 MG PO CAPS: 15.0000 mg | ORAL_CAPSULE | Freq: Every evening | ORAL | Status: DC | PRN

## 2013-12-11 NOTE — Telephone Encounter (Signed)
Done

## 2013-12-11 NOTE — Telephone Encounter (Signed)
OK to fill this prescription with additional refills x3 Thank you!  

## 2013-12-17 ENCOUNTER — Ambulatory Visit: Payer: Medicare Other | Admitting: Internal Medicine

## 2013-12-31 ENCOUNTER — Encounter: Payer: Self-pay | Admitting: Internal Medicine

## 2013-12-31 ENCOUNTER — Ambulatory Visit (INDEPENDENT_AMBULATORY_CARE_PROVIDER_SITE_OTHER): Payer: Medicare Other | Admitting: Internal Medicine

## 2013-12-31 VITALS — BP 154/96 | HR 84 | Temp 98.5°F | Resp 16 | Wt 150.0 lb

## 2013-12-31 DIAGNOSIS — F411 Generalized anxiety disorder: Secondary | ICD-10-CM

## 2013-12-31 DIAGNOSIS — F17200 Nicotine dependence, unspecified, uncomplicated: Secondary | ICD-10-CM

## 2013-12-31 DIAGNOSIS — F329 Major depressive disorder, single episode, unspecified: Secondary | ICD-10-CM

## 2013-12-31 DIAGNOSIS — K219 Gastro-esophageal reflux disease without esophagitis: Secondary | ICD-10-CM

## 2013-12-31 DIAGNOSIS — E785 Hyperlipidemia, unspecified: Secondary | ICD-10-CM

## 2013-12-31 DIAGNOSIS — J449 Chronic obstructive pulmonary disease, unspecified: Secondary | ICD-10-CM

## 2013-12-31 DIAGNOSIS — I6529 Occlusion and stenosis of unspecified carotid artery: Secondary | ICD-10-CM

## 2013-12-31 DIAGNOSIS — F3289 Other specified depressive episodes: Secondary | ICD-10-CM

## 2013-12-31 MED ORDER — OXYCODONE-ACETAMINOPHEN 10-325 MG PO TABS: 1.0000 | ORAL_TABLET | Freq: Four times a day (QID) | ORAL | Status: DC | PRN

## 2013-12-31 MED ORDER — DIETHYLPROPION HCL 25 MG PO TABS: ORAL_TABLET | ORAL | Status: DC

## 2013-12-31 MED ORDER — PREDNISOLONE 5 MG PO TABS
10.0000 mg | ORAL_TABLET | Freq: Every day | ORAL | Status: DC
Start: 2013-12-31 — End: 2015-04-21

## 2013-12-31 MED ORDER — TEMAZEPAM 15 MG PO CAPS: 15.0000 mg | ORAL_CAPSULE | Freq: Every evening | ORAL | Status: DC | PRN

## 2013-12-31 MED ORDER — ALPRAZOLAM 1 MG PO TABS: 1.0000 mg | ORAL_TABLET | Freq: Four times a day (QID) | ORAL | Status: DC | PRN

## 2013-12-31 MED ORDER — ESOMEPRAZOLE MAGNESIUM 40 MG PO CPDR: 40.0000 mg | DELAYED_RELEASE_CAPSULE | Freq: Every day | ORAL | Status: DC

## 2013-12-31 NOTE — Progress Notes (Signed)
Pre visit review using our clinic review tool, if applicable. No additional management support is needed unless otherwise documented below in the visit note. 

## 2013-12-31 NOTE — Progress Notes (Signed)
Subjective:  F/u neck pain and R shoulder pain C/o neck/shoulder and LBP pain and siffness - months C/o indigestion and trouble swallowing x 1-2 mo   Hypertension This is a chronic problem. The current episode started more than 1 year ago. Associated symptoms include anxiety and headaches. Pertinent negatives include no blurred vision or palpitations. Past treatments include nothing.  Back Pain This is a chronic problem. The problem has been waxing and waning since onset. The pain is present in the sacro-iliac and lumbar spine. The quality of the pain is described as aching. The pain is at a severity of 7/10. The pain is severe. The pain is worse during the day. The symptoms are aggravated by bending and position. Associated symptoms include headaches. Pertinent negatives include no abdominal pain, numbness or weakness.  Anxiety Presents for follow-up visit. Symptoms include decreased concentration and nervous/anxious behavior. Patient reports no confusion, dizziness, nausea, palpitations or suicidal ideas. Symptoms occur most days. The quality of sleep is fair. Nighttime awakenings: occasional.   Compliance with medications is 26-50%.   Still can't walk far due to knee and LBP F/u wt loss and arthralgias w/Daliresp - she stopped it 1-2 mo ago - resolved F/u R facial numbness episodes x1-2 h - no relapse The patient presents for a follow-up of  COPD, chronic anxiety, chronic dyslipidemia, depression controlled with medicines. She quit smoking 1/13, restarted in 5/13 due to stress. Quit again in 2015 (on e-cigs)  BP Readings from Last 3 Encounters:  12/31/13 154/96  09/25/13 138/80  07/31/13 132/84   Wt Readings from Last 3 Encounters:  12/31/13 150 lb (68.04 kg)  07/31/13 147 lb 6 oz (66.849 kg)  07/02/13 145 lb (65.772 kg)      Review of Systems  Constitutional: Negative for chills, activity change, appetite change, fatigue and unexpected weight change.  HENT: Negative for  congestion, mouth sores and sinus pressure.   Eyes: Negative for blurred vision and visual disturbance.  Respiratory: Negative for cough and chest tightness.   Cardiovascular: Negative for palpitations.  Gastrointestinal: Negative for nausea and abdominal pain.  Genitourinary: Negative for frequency, difficulty urinating and vaginal pain.  Musculoskeletal: Positive for back pain. Negative for gait problem.  Skin: Negative for pallor and rash.  Neurological: Positive for headaches. Negative for dizziness, tremors, weakness and numbness.  Psychiatric/Behavioral: Positive for sleep disturbance and decreased concentration. Negative for suicidal ideas and confusion. The patient is nervous/anxious.        Objective:   Physical Exam  Constitutional: She appears well-developed. No distress.  HENT:  Head: Normocephalic.  Right Ear: External ear normal.  Left Ear: External ear normal.  Nose: Nose normal.  Mouth/Throat: Oropharynx is clear and moist.  Eyes: Conjunctivae are normal. Pupils are equal, round, and reactive to light. Right eye exhibits no discharge. Left eye exhibits no discharge.  Neck: Normal range of motion. Neck supple. No JVD present. No tracheal deviation present. No thyromegaly present.  Cardiovascular: Normal rate, regular rhythm and normal heart sounds.   Pulmonary/Chest: No stridor. No respiratory distress. She has no wheezes.  Abdominal: Soft. Bowel sounds are normal. She exhibits no distension and no mass. There is no tenderness. There is no rebound and no guarding.  Musculoskeletal: She exhibits no edema and no tenderness.  Lymphadenopathy:    She has no cervical adenopathy.  Neurological: She displays normal reflexes. No cranial nerve deficit. She exhibits normal muscle tone. Coordination normal.  Skin: No rash noted. No erythema.  Psychiatric: Her behavior is  normal. Judgment and thought content normal.   LS is tender  Lab Results  Component Value Date   WBC 6.5  06/28/2011   HGB 16.0* 06/28/2011   HCT 47.6* 06/28/2011   PLT 187.0 06/28/2011   GLUCOSE 77 10/07/2013   CHOL 190 04/24/2013   TRIG 149.0 04/24/2013   HDL 41.80 04/24/2013   LDLDIRECT 162.8 06/28/2011   LDLCALC 118* 04/24/2013   ALT 16 04/24/2013   AST 18 04/24/2013   NA 142 10/07/2013   K 4.2 10/07/2013   CL 104 10/07/2013   CREATININE 0.8 10/07/2013   BUN 10 10/07/2013   CO2 29 10/07/2013   TSH 1.53 06/28/2011   HGBA1C 5.6 01/02/2008          Assessment & Plan:

## 2014-01-01 ENCOUNTER — Telehealth: Payer: Self-pay | Admitting: Internal Medicine

## 2014-01-01 NOTE — Telephone Encounter (Signed)
Relevant patient education assigned to patient using Emmi. ° °

## 2014-01-02 ENCOUNTER — Telehealth: Payer: Self-pay | Admitting: *Deleted

## 2014-01-02 NOTE — Telephone Encounter (Signed)
Pharmacist called states the Prednisolone is too expensive, requesting to change the Rx to Prednisone.  Please advise

## 2014-01-03 NOTE — Telephone Encounter (Signed)
Yes, sure Thx!

## 2014-01-06 NOTE — Telephone Encounter (Signed)
Spoke with pharmacist advised of MDs message 

## 2014-01-12 NOTE — Assessment & Plan Note (Signed)
Continue with current prescription therapy as reflected on the Med list.  

## 2014-01-12 NOTE — Assessment & Plan Note (Signed)
Labs

## 2014-01-12 NOTE — Assessment & Plan Note (Signed)
Doing better, smoking less

## 2014-02-02 ENCOUNTER — Other Ambulatory Visit: Payer: Self-pay | Admitting: Internal Medicine

## 2014-02-12 ENCOUNTER — Telehealth: Payer: Self-pay | Admitting: Internal Medicine

## 2014-02-12 NOTE — Telephone Encounter (Signed)
PA request for Temazapam and Alprazolam.  PA accepted for Temazapam over the phone.   PA initiated for the Alprazolam. Waiting on response.

## 2014-02-12 NOTE — Telephone Encounter (Signed)
PA request

## 2014-02-13 ENCOUNTER — Encounter: Payer: Self-pay | Admitting: Internal Medicine

## 2014-02-19 ENCOUNTER — Telehealth: Payer: Self-pay | Admitting: *Deleted

## 2014-02-19 DIAGNOSIS — Z129 Encounter for screening for malignant neoplasm, site unspecified: Secondary | ICD-10-CM

## 2014-02-19 DIAGNOSIS — Z1239 Encounter for other screening for malignant neoplasm of breast: Secondary | ICD-10-CM

## 2014-02-19 NOTE — Telephone Encounter (Signed)
Pt needs mammogram and she doesn't know where to go.

## 2014-02-20 NOTE — Telephone Encounter (Signed)
[facility] -- done Thx

## 2014-02-24 ENCOUNTER — Telehealth: Payer: Self-pay | Admitting: Internal Medicine

## 2014-02-24 NOTE — Telephone Encounter (Signed)
PA for alprazolam PA was approved. The quantity approved was 120 in 30 days.

## 2014-02-28 ENCOUNTER — Ambulatory Visit: Payer: Medicare Other | Admitting: Internal Medicine

## 2014-03-10 ENCOUNTER — Other Ambulatory Visit: Payer: Self-pay | Admitting: Internal Medicine

## 2014-03-14 ENCOUNTER — Other Ambulatory Visit: Payer: Self-pay | Admitting: Internal Medicine

## 2014-03-14 ENCOUNTER — Ambulatory Visit: Admission: RE | Admit: 2014-03-14 | Payer: Medicare Other | Source: Ambulatory Visit

## 2014-03-14 ENCOUNTER — Ambulatory Visit
Admission: RE | Admit: 2014-03-14 | Discharge: 2014-03-14 | Disposition: A | Discharge status: Home / Self Care | Payer: Medicare Other | Source: Ambulatory Visit | Attending: Internal Medicine | Admitting: Internal Medicine

## 2014-03-14 DIAGNOSIS — Z129 Encounter for screening for malignant neoplasm, site unspecified: Secondary | ICD-10-CM

## 2014-03-14 DIAGNOSIS — Z1231 Encounter for screening mammogram for malignant neoplasm of breast: Secondary | ICD-10-CM

## 2014-03-19 ENCOUNTER — Ambulatory Visit (INDEPENDENT_AMBULATORY_CARE_PROVIDER_SITE_OTHER): Payer: Medicare Other | Admitting: Internal Medicine

## 2014-03-19 ENCOUNTER — Encounter: Payer: Self-pay | Admitting: Internal Medicine

## 2014-03-19 VITALS — BP 132/68 | HR 76 | Temp 98.1°F | Resp 16 | Wt 151.0 lb

## 2014-03-19 DIAGNOSIS — I6529 Occlusion and stenosis of unspecified carotid artery: Secondary | ICD-10-CM

## 2014-03-19 DIAGNOSIS — M129 Arthropathy, unspecified: Secondary | ICD-10-CM

## 2014-03-19 DIAGNOSIS — M1710 Unilateral primary osteoarthritis, unspecified knee: Secondary | ICD-10-CM

## 2014-03-19 DIAGNOSIS — IMO0002 Reserved for concepts with insufficient information to code with codable children: Secondary | ICD-10-CM

## 2014-03-19 DIAGNOSIS — M179 Osteoarthritis of knee, unspecified: Secondary | ICD-10-CM | POA: Insufficient documentation

## 2014-03-19 MED ORDER — OXYCODONE-ACETAMINOPHEN 10-325 MG PO TABS: 1.0000 | ORAL_TABLET | Freq: Four times a day (QID) | ORAL | Status: DC | PRN

## 2014-03-19 MED ORDER — ALPRAZOLAM 1 MG PO TABS: ORAL_TABLET | ORAL | Status: DC

## 2014-03-19 NOTE — Progress Notes (Signed)
Patient ID: Theresa Aguirre, female   DOB: 09-29-1946, 68 y.o.   MRN: [id number]   Subjective:  F/u neck pain and R shoulder pain C/o neck/shoulder and LBP pain and siffness - months C/o indigestion and trouble swallowing x 1-2 mo   Hypertension This is a chronic problem. The current episode started more than 1 year ago. Associated symptoms include anxiety and headaches. Pertinent negatives include no blurred vision or palpitations. Past treatments include nothing.  Back Pain This is a chronic problem. The problem has been waxing and waning since onset. The pain is present in the sacro-iliac and lumbar spine. The quality of the pain is described as aching. The pain is at a severity of 7/10. The pain is severe. The pain is worse during the day. The symptoms are aggravated by bending and position. Associated symptoms include headaches. Pertinent negatives include no abdominal pain, numbness or weakness.  Anxiety Presents for follow-up visit. Symptoms include decreased concentration and nervous/anxious behavior. Patient reports no confusion, dizziness, nausea, palpitations or suicidal ideas. Symptoms occur most days. The quality of sleep is fair. Nighttime awakenings: occasional.   Compliance with medications is 26-50%.   Still can't walk far due to knee and LBP F/u wt loss and arthralgias w/Daliresp - she stopped it 1-2 mo ago - resolved F/u R facial numbness episodes x1-2 h - no relapse The patient presents for a follow-up of  COPD, chronic anxiety, chronic dyslipidemia, depression controlled with medicines. She quit smoking 1/13, restarted in 5/13 due to stress. Quit again in 2015 (on e-cigs)  BP Readings from Last 3 Encounters:  03/19/14 132/68  12/31/13 154/96  09/25/13 138/80   Wt Readings from Last 3 Encounters:  03/19/14 151 lb (68.493 kg)  12/31/13 150 lb (68.04 kg)  07/31/13 147 lb 6 oz (66.849 kg)      Review of Systems  Constitutional: Negative for chills, activity change,  appetite change, fatigue and unexpected weight change.  HENT: Negative for congestion, mouth sores and sinus pressure.   Eyes: Negative for blurred vision and visual disturbance.  Respiratory: Negative for cough and chest tightness.   Cardiovascular: Negative for palpitations.  Gastrointestinal: Negative for nausea and abdominal pain.  Genitourinary: Negative for frequency, difficulty urinating and vaginal pain.  Musculoskeletal: Positive for back pain. Negative for gait problem.  Skin: Negative for pallor and rash.  Neurological: Positive for headaches. Negative for dizziness, tremors, weakness and numbness.  Psychiatric/Behavioral: Positive for sleep disturbance and decreased concentration. Negative for suicidal ideas and confusion. The patient is nervous/anxious.        Objective:   Physical Exam  Constitutional: She appears well-developed. No distress.  HENT:  Head: Normocephalic.  Right Ear: External ear normal.  Left Ear: External ear normal.  Nose: Nose normal.  Mouth/Throat: Oropharynx is clear and moist.  Eyes: Conjunctivae are normal. Pupils are equal, round, and reactive to light. Right eye exhibits no discharge. Left eye exhibits no discharge.  Neck: Normal range of motion. Neck supple. No JVD present. No tracheal deviation present. No thyromegaly present.  Cardiovascular: Normal rate, regular rhythm and normal heart sounds.   Pulmonary/Chest: No stridor. No respiratory distress. She has no wheezes.  Abdominal: Soft. Bowel sounds are normal. She exhibits no distension and no mass. There is no tenderness. There is no rebound and no guarding.  Musculoskeletal: She exhibits no edema and no tenderness.  Lymphadenopathy:    She has no cervical adenopathy.  Neurological: She displays normal reflexes. No cranial nerve deficit. She exhibits normal  muscle tone. Coordination normal.  Skin: No rash noted. No erythema.  Psychiatric: Her behavior is normal. Judgment and thought  content normal.  L knee is tender w/ROM LS is tender  Lab Results  Component Value Date   WBC 6.5 06/28/2011   HGB 16.0* 06/28/2011   HCT 47.6* 06/28/2011   PLT 187.0 06/28/2011   GLUCOSE 77 10/07/2013   CHOL 190 04/24/2013   TRIG 149.0 04/24/2013   HDL 41.80 04/24/2013   LDLDIRECT 162.8 06/28/2011   LDLCALC 118* 04/24/2013   ALT 16 04/24/2013   AST 18 04/24/2013   NA 142 10/07/2013   K 4.2 10/07/2013   CL 104 10/07/2013   CREATININE 0.8 10/07/2013   BUN 10 10/07/2013   CO2 29 10/07/2013   TSH 1.53 06/28/2011   HGBA1C 5.6 01/02/2008          Assessment & Plan:

## 2014-03-19 NOTE — Assessment & Plan Note (Signed)
6/15 B OA L>R Dr Tamala Julian ?steroid inj ?Synvisc inj discussed

## 2014-03-19 NOTE — Assessment & Plan Note (Signed)
Continue with current prescription therapy as reflected on the Med list.  

## 2014-03-19 NOTE — Progress Notes (Signed)
Pre visit review using our clinic review tool, if applicable. No additional management support is needed unless otherwise documented below in the visit note. 

## 2014-04-03 ENCOUNTER — Ambulatory Visit: Payer: Medicare Other | Admitting: Family Medicine

## 2014-05-12 ENCOUNTER — Encounter: Payer: Self-pay | Admitting: Family Medicine

## 2014-05-12 ENCOUNTER — Ambulatory Visit (INDEPENDENT_AMBULATORY_CARE_PROVIDER_SITE_OTHER)
Admission: RE | Admit: 2014-05-12 | Discharge: 2014-05-12 | Disposition: A | Discharge status: Home / Self Care | Payer: Medicare Other | Source: Ambulatory Visit | Attending: Family Medicine | Admitting: Family Medicine

## 2014-05-12 ENCOUNTER — Other Ambulatory Visit (INDEPENDENT_AMBULATORY_CARE_PROVIDER_SITE_OTHER): Payer: Medicare Other

## 2014-05-12 ENCOUNTER — Ambulatory Visit (INDEPENDENT_AMBULATORY_CARE_PROVIDER_SITE_OTHER): Payer: Medicare Other | Admitting: Family Medicine

## 2014-05-12 VITALS — BP 140/82 | HR 89 | Ht 65.0 in | Wt 148.0 lb

## 2014-05-12 DIAGNOSIS — M25561 Pain in right knee: Secondary | ICD-10-CM

## 2014-05-12 DIAGNOSIS — M25569 Pain in unspecified knee: Secondary | ICD-10-CM

## 2014-05-12 DIAGNOSIS — M1711 Unilateral primary osteoarthritis, right knee: Secondary | ICD-10-CM

## 2014-05-12 DIAGNOSIS — M1710 Unilateral primary osteoarthritis, unspecified knee: Secondary | ICD-10-CM | POA: Insufficient documentation

## 2014-05-12 DIAGNOSIS — I6529 Occlusion and stenosis of unspecified carotid artery: Secondary | ICD-10-CM

## 2014-05-12 NOTE — Assessment & Plan Note (Signed)
Patient overall is doing relatively well and is still very active. Patient was given a corticosteroid injection today that did seem to resolve the pain. Patient was given home exercise program. We discussed bracing which patient declined today. Patient will try over-the-counter medications and will avoid any other blood thinners secondary to her stenosis of the carotid artery. Patient will come back again in 3 weeks. Patient continues to have trouble we can need to consider formal physical therapy or viscous supplementation.

## 2014-05-12 NOTE — Progress Notes (Signed)
[facility] [place],  [address] Phone: [phone] Subjective:    I'm seeing this patient by the request  of:  Walker Kehr, MD   CC: Right knee pain.   NIO:[id number] Theresa Aguirre is a 68 y.o. female coming in with complaint of right knee pain. Patient states that she's had this pain for years but it has increased in frequency. Patient denies any radiation of the leg or any numbness. Patient states that the pain is starting to get to the way of her during regular daily activities. Patient states taking stairs is very painful. Patient has tried changing shoes, doing some exercises, as well as some ice and over-the-counter topicals without any significant improvement. Patient has tried over-the-counter compression sleeve that she was wearing at night because worsening swelling she does not want to wear her brace. He should states that the pain is more of a dull aching pain that is worse with ambulation and better with rest. Patient was a severity is 7/10.     Past medical history, social, surgical and family history all reviewed in electronic medical record.   Review of Systems: No headache, visual changes, nausea, vomiting, diarrhea, constipation, dizziness, abdominal pain, skin rash, fevers, chills, night sweats, weight loss, swollen lymph nodes, body aches, joint swelling, muscle aches, chest pain, shortness of breath, mood changes.   Objective Blood pressure 140/82, pulse 89, height 5\' 5"  (1.651 m), weight 148 lb (67.132 kg), SpO2 99.00%.  General: No apparent distress alert and oriented x3 mood and affect normal, dressed appropriately.  HEENT: Pupils equal, extraocular movements intact  Respiratory: Patient's speak in full sentences and does not appear short of breath  Cardiovascular: No lower extremity edema, non tender, no erythema  Skin: Warm dry intact with no signs of infection or rash on extremities or on axial skeleton.    Abdomen: Soft nontender  Neuro: Cranial nerves II through XII are intact, neurovascularly intact in all extremities with 2+ DTRs and 2+ pulses.  Lymph: No lymphadenopathy of posterior or anterior cervical chain or axillae bilaterally.  Gait normal with good balance and coordination.  MSK:  Non tender with full range of motion and good stability and symmetric strength and tone of shoulders, elbows, wrist, hip, and ankles bilaterally.  Knee: Normal to inspection with no erythema or effusion or obvious bony abnormalities. Palpation reveals some mild medial joint line tenderness. ROM full in flexion and extension and lower leg rotation. Ligaments with solid consistent endpoints including ACL, PCL, LCL, MCL. Negative Mcmurray's, Apley's, and Thessalonian tests. Non painful patellar compression. Patellar glide without crepitus. Patellar and quadriceps tendons unremarkable. Hamstring and quadriceps strength is normal.  Contralateral knee also has some joint line tenderness.   MSK US performed of: Right knee This study was ordered, performed, and interpreted by Charlann Boxer D.O.  Knee: All structures visualized. Anteromedial, anterolateral, posteromedial, and posterolateral menisci unremarkable without tearing, fraying, effusion, or displacement. Mild degenerative changes noted mild to moderate narrowing of the medial and lateral joint space. Patellar Tendon unremarkable on long and transverse views without effusion. No abnormality of prepatellar bursa. LCL and MCL unremarkable on long and transverse views. No abnormality of origin of medial or lateral head of the gastrocnemius.  IMPRESSION:  Mild to moderate osteoarthritic changes  After informed written and verbal consent, patient was seated on exam table. Right knee was prepped with alcohol swab and utilizing anterolateral approach, patient's right knee space was injected with 4:1  marcaine 0.5%:  Kenalog 40mg /dL. Patient tolerated the  procedure well without immediate complications.   Impression and Recommendations:     This case required medical decision making of moderate complexity.

## 2014-05-12 NOTE — Patient Instructions (Signed)
Very nice to meet you Ice 20 minutes 2 times daily. Usually after activity and before bed. Exercises 3 times a week.  Xrays today.  Congrates on stopping on smoking Take tylenol 650 mg three times a day is the best evidence based medicine we have for arthritis.  Glucosamine sulfate 750mg  twice a day is a supplement that has been shown to help moderate to severe arthritis. Vitamin D 2000 IU daily Fish oil 2 grams daily.  Tumeric 500mg  twice daily.  Capsaicin topically up to four times a day may also help with pain. Cortisone injections are an option if these interventions do not seem to make a difference or need more relief.  If cortisone injections do not help, there are different types of shots that may help but they take longer to take effect.  We can discuss this at follow up.  It's important that you continue to stay active. Controlling your weight is important.  Consider physical therapy to strengthen muscles around the joint that hurts to take pressure off of the joint itself. Water aerobics and cycling with low resistance are the best two types of exercise for arthritis. Come back and see me in 3 weeks.

## 2014-05-14 ENCOUNTER — Other Ambulatory Visit (HOSPITAL_COMMUNITY): Payer: Self-pay | Admitting: *Deleted

## 2014-05-14 DIAGNOSIS — I6529 Occlusion and stenosis of unspecified carotid artery: Secondary | ICD-10-CM

## 2014-05-15 ENCOUNTER — Encounter (HOSPITAL_COMMUNITY): Payer: Medicare Other

## 2014-05-16 ENCOUNTER — Ambulatory Visit: Payer: Medicare Other | Admitting: Family Medicine

## 2014-05-21 ENCOUNTER — Encounter (HOSPITAL_COMMUNITY): Payer: Medicare Other

## 2014-05-30 ENCOUNTER — Encounter (HOSPITAL_COMMUNITY): Payer: Medicare Other

## 2014-06-04 ENCOUNTER — Ambulatory Visit: Payer: Medicare Other | Admitting: Family Medicine

## 2014-06-04 ENCOUNTER — Ambulatory Visit (HOSPITAL_COMMUNITY): Payer: Medicare Other | Attending: Cardiovascular Disease | Admitting: Cardiology

## 2014-06-04 DIAGNOSIS — J449 Chronic obstructive pulmonary disease, unspecified: Secondary | ICD-10-CM | POA: Diagnosis not present

## 2014-06-04 DIAGNOSIS — I6529 Occlusion and stenosis of unspecified carotid artery: Secondary | ICD-10-CM | POA: Diagnosis present

## 2014-06-04 DIAGNOSIS — F17200 Nicotine dependence, unspecified, uncomplicated: Secondary | ICD-10-CM | POA: Diagnosis not present

## 2014-06-04 DIAGNOSIS — E785 Hyperlipidemia, unspecified: Secondary | ICD-10-CM | POA: Insufficient documentation

## 2014-06-04 DIAGNOSIS — J4489 Other specified chronic obstructive pulmonary disease: Secondary | ICD-10-CM | POA: Insufficient documentation

## 2014-06-04 NOTE — Progress Notes (Signed)
Carotid duplex performed 

## 2014-06-30 ENCOUNTER — Other Ambulatory Visit: Payer: Self-pay | Admitting: Internal Medicine

## 2014-08-07 ENCOUNTER — Ambulatory Visit (INDEPENDENT_AMBULATORY_CARE_PROVIDER_SITE_OTHER): Payer: Medicare Other | Admitting: Internal Medicine

## 2014-08-07 ENCOUNTER — Encounter: Payer: Self-pay | Admitting: Internal Medicine

## 2014-08-07 VITALS — BP 160/98 | HR 80 | Temp 98.0°F | Wt 152.0 lb

## 2014-08-07 DIAGNOSIS — Z23 Encounter for immunization: Secondary | ICD-10-CM

## 2014-08-07 DIAGNOSIS — I6529 Occlusion and stenosis of unspecified carotid artery: Secondary | ICD-10-CM

## 2014-08-07 MED ORDER — ALPRAZOLAM 1 MG PO TABS: ORAL_TABLET | ORAL | Status: DC

## 2014-08-07 MED ORDER — OXYCODONE-ACETAMINOPHEN 10-325 MG PO TABS: 1.0000 | ORAL_TABLET | Freq: Four times a day (QID) | ORAL | Status: DC | PRN

## 2014-08-07 MED ORDER — TEMAZEPAM 15 MG PO CAPS: 15.0000 mg | ORAL_CAPSULE | Freq: Every evening | ORAL | Status: DC | PRN

## 2014-08-07 NOTE — Progress Notes (Signed)
Subjective:  F/u neck pain and R shoulder pain F/u neck/shoulder and LBP pain and siffness - months Problems w/son   Theresa Aguirre was ill again   Hypertension This is a chronic problem. The current episode started more than 1 year ago. Associated symptoms include anxiety and headaches. Pertinent negatives include no blurred vision or palpitations. Past treatments include nothing.  Back Pain This is a chronic problem. The problem has been waxing and waning since onset. The pain is present in the sacro-iliac and lumbar spine. The quality of the pain is described as aching. The pain is at a severity of 7/10. The pain is severe. The pain is worse during the day. The symptoms are aggravated by bending and position. Associated symptoms include headaches. Pertinent negatives include no abdominal pain, numbness or weakness.  Anxiety Presents for follow-up visit. Symptoms include decreased concentration and nervous/anxious behavior. Patient reports no confusion, dizziness, nausea, palpitations or suicidal ideas. Symptoms occur most days. The quality of sleep is fair. Nighttime awakenings: occasional.   Compliance with medications is 26-50%.    F/u wt loss and arthralgias w/Daliresp - she stopped it 1-2 mo ago - resolved F/u R facial numbness episodes x1-2 h - no relapse The patient presents for a follow-up of  COPD, chronic anxiety, chronic dyslipidemia, depression controlled with medicines. She quit smoking 1/13, restarted in 5/13 due to stress. Quit again in 2015 (on e-cigs)  BP Readings from Last 3 Encounters:  08/07/14 160/98  05/12/14 140/82  03/19/14 132/68   Wt Readings from Last 3 Encounters:  08/07/14 152 lb (68.947 kg)  05/12/14 148 lb (67.132 kg)  03/19/14 151 lb (68.493 kg)      Review of Systems  Constitutional: Negative for chills, activity change, appetite change, fatigue and unexpected weight change.  HENT: Negative for congestion, mouth sores and sinus pressure.   Eyes:  Negative for blurred vision and visual disturbance.  Respiratory: Negative for cough and chest tightness.   Cardiovascular: Negative for palpitations.  Gastrointestinal: Negative for nausea and abdominal pain.  Genitourinary: Negative for frequency, difficulty urinating and vaginal pain.  Musculoskeletal: Positive for back pain. Negative for gait problem.  Skin: Negative for pallor and rash.  Neurological: Positive for headaches. Negative for dizziness, tremors, weakness and numbness.  Psychiatric/Behavioral: Positive for sleep disturbance and decreased concentration. Negative for suicidal ideas and confusion. The patient is nervous/anxious.        Objective:   Physical Exam  Constitutional: She appears well-developed. No distress.  HENT:  Head: Normocephalic.  Right Ear: External ear normal.  Left Ear: External ear normal.  Nose: Nose normal.  Mouth/Throat: Oropharynx is clear and moist.  Eyes: Conjunctivae are normal. Pupils are equal, round, and reactive to light. Right eye exhibits no discharge. Left eye exhibits no discharge.  Neck: Normal range of motion. Neck supple. No JVD present. No tracheal deviation present. No thyromegaly present.  Cardiovascular: Normal rate, regular rhythm and normal heart sounds.   Pulmonary/Chest: No stridor. No respiratory distress. She has no wheezes.  Abdominal: Soft. Bowel sounds are normal. She exhibits no distension and no mass. There is no tenderness. There is no rebound and no guarding.  Musculoskeletal: She exhibits no edema and no tenderness.  Lymphadenopathy:    She has no cervical adenopathy.  Neurological: She displays normal reflexes. No cranial nerve deficit. She exhibits normal muscle tone. Coordination normal.  Skin: No rash noted. No erythema.  Psychiatric: Her behavior is normal. Judgment and thought content normal.  L knee is  tender w/ROM LS is tender  Lab Results  Component Value Date   WBC 6.5 06/28/2011   HGB 16.0*  06/28/2011   HCT 47.6* 06/28/2011   PLT 187.0 06/28/2011   GLUCOSE 77 10/07/2013   CHOL 190 04/24/2013   TRIG 149.0 04/24/2013   HDL 41.80 04/24/2013   LDLDIRECT 162.8 06/28/2011   LDLCALC 118* 04/24/2013   ALT 16 04/24/2013   AST 18 04/24/2013   NA 142 10/07/2013   K 4.2 10/07/2013   CL 104 10/07/2013   CREATININE 0.8 10/07/2013   BUN 10 10/07/2013   CO2 29 10/07/2013   TSH 1.53 06/28/2011   HGBA1C 5.6 01/02/2008          Assessment & Plan:

## 2014-08-07 NOTE — Progress Notes (Signed)
Pre visit review using our clinic review tool, if applicable. No additional management support is needed unless otherwise documented below in the visit note. 

## 2014-08-08 ENCOUNTER — Telehealth: Payer: Self-pay | Admitting: Internal Medicine

## 2014-08-08 NOTE — Telephone Encounter (Signed)
emmi emailed °

## 2014-09-03 ENCOUNTER — Telehealth: Payer: Self-pay | Admitting: *Deleted

## 2014-09-03 MED ORDER — TEMAZEPAM 15 MG PO CAPS: 15.0000 mg | ORAL_CAPSULE | Freq: Every evening | ORAL | Status: DC | PRN

## 2014-09-03 NOTE — Telephone Encounter (Signed)
Rf req for Temazepam 15 mg 1-2 po qhs prn insomnia. #180. Ok to Rf?

## 2014-09-03 NOTE — Telephone Encounter (Signed)
OK to fill this prescription with additional refills x1 if time Thank you!

## 2014-09-03 NOTE — Telephone Encounter (Signed)
Done

## 2014-10-08 ENCOUNTER — Ambulatory Visit (INDEPENDENT_AMBULATORY_CARE_PROVIDER_SITE_OTHER): Payer: Medicare Other | Admitting: Internal Medicine

## 2014-10-08 ENCOUNTER — Encounter: Payer: Self-pay | Admitting: Internal Medicine

## 2014-10-08 VITALS — BP 162/100 | HR 78 | Temp 98.2°F | Ht 65.0 in | Wt 157.0 lb

## 2014-10-08 DIAGNOSIS — R609 Edema, unspecified: Secondary | ICD-10-CM | POA: Insufficient documentation

## 2014-10-08 DIAGNOSIS — K219 Gastro-esophageal reflux disease without esophagitis: Secondary | ICD-10-CM

## 2014-10-08 DIAGNOSIS — F411 Generalized anxiety disorder: Secondary | ICD-10-CM

## 2014-10-08 DIAGNOSIS — F331 Major depressive disorder, recurrent, moderate: Secondary | ICD-10-CM

## 2014-10-08 DIAGNOSIS — R21 Rash and other nonspecific skin eruption: Secondary | ICD-10-CM | POA: Insufficient documentation

## 2014-10-08 DIAGNOSIS — M129 Arthropathy, unspecified: Secondary | ICD-10-CM

## 2014-10-08 DIAGNOSIS — J449 Chronic obstructive pulmonary disease, unspecified: Secondary | ICD-10-CM

## 2014-10-08 DIAGNOSIS — I6529 Occlusion and stenosis of unspecified carotid artery: Secondary | ICD-10-CM

## 2014-10-08 MED ORDER — ALBUTEROL SULFATE HFA 108 (90 BASE) MCG/ACT IN AERS: 2.0000 | INHALATION_SPRAY | Freq: Four times a day (QID) | RESPIRATORY_TRACT | Status: DC | PRN

## 2014-10-08 MED ORDER — FLUTICASONE-SALMETEROL 250-50 MCG/DOSE IN AEPB: 1.0000 | INHALATION_SPRAY | Freq: Two times a day (BID) | RESPIRATORY_TRACT | Status: DC

## 2014-10-08 MED ORDER — HYDROCHLOROTHIAZIDE 12.5 MG PO CAPS: 12.5000 mg | ORAL_CAPSULE | Freq: Every day | ORAL | Status: DC

## 2014-10-08 NOTE — Assessment & Plan Note (Signed)
See Rx 

## 2014-10-08 NOTE — Assessment & Plan Note (Signed)
Continue with current prescription therapy as reflected on the Med list.  

## 2014-10-08 NOTE — Assessment & Plan Note (Signed)
Continue with current prn prescription therapy as reflected on the Med list.  

## 2014-10-08 NOTE — Assessment & Plan Note (Signed)
12/15 Multifactorial NAS diet Loose wt Re-start HCTZ

## 2014-10-08 NOTE — Assessment & Plan Note (Signed)
Chronic - ongoing refractory tobacco use - she stopped smoking on 10/11/11 and 1/15, restarted in 5/13 and 12/15 Discussed

## 2014-10-08 NOTE — Assessment & Plan Note (Signed)
12/15 B shins - ?gluten allergy vs other Cosider bx, derm referral  Gluten free trial (no wheat products) for 4-6 weeks. OK to use gluten-free bread and gluten-free pasta.  Milk free trial (no milk, ice cream, cheese and yogurt) for 4-6 weeks. OK to use almond, coconut, rice or soy milk. "Almond breeze" brand tastes good.

## 2014-10-08 NOTE — Patient Instructions (Addendum)
Gluten free trial (no wheat products). OK to use gluten-free bread and gluten-free pasta.  Milk free trial (no milk, ice cream, cheese and yogurt) for 4-6 weeks. OK to use almond, coconut, rice or soy milk. "Almond breeze" brand tastes good.    ?????????? ????? (Low-Sodium Eating Plan) ?????? ???????????? ????????? ????????????? ???????? ? ?????????? ???? ? ?????????. ?????????? ?????? ?????? ? ????? ???????? ??????? ???????????? ????????, ????????? ???? ? ????? ??????? ???????? ???? ??????, ?????? ? ?????. ?? ???????? ?????? ????? ?????????? ???? (?????????? ??????) ? ????. ?????????? ????? ?????? ????????? ?? ?????????, ?????????? ? ???????????? ?????????. ??????????? ????, ????-???? ? ????? ????? ?????????? ????? ??????? ??????????? ??????. ???? ???? ?? ?????????? ????????????? ????????? (??? ???????? ????????????? ????????) ??? ????????? ??? ???????? ???????? ?? ?????????, ????? ????? ??????? ??????????? ?????? ? ?????. ????? ??? ????? ??????????? ????? ?????? ?????????? ??????????? ?????? ?? 2300 ?? ? ????. ??? ??????? ???? ??????????? ?????????? ??????????? ?????? ?? __________ ? ????.  ??? ??? ????????? ????? ?? ???? ?????? ??? ?????????? ????? ?????????? ????? (? ?????? ??????????? ??????) ?? ?????? ????????? ????????????? ????? ?????????????:  ????????? ????????, ?????????? ????? 5% ???????? ????? ??????????? ?????? (??? ??????? ?? ???????? ??????? ?????????).  ?????? ?????????? ???? ??? ??????? ???? ??????????? ?????????? ???????? ??? ?????.  ????? ?????????????? ??????????? ???? ??????????????????? ? ????? ?????? ??? ???????????.  ????? ?????? ????????.  ???????????? ? ???? ?????? ??????? ? ??????.  ?????????? ???????????? ???????????????? ??????. ???? ?? ?? ????????????, ????? ????????? ?????????? ??????, ?????? ???????? ??.  ?????????? ???????????? ???? ?? 1 ????? (28 ?) ? ????.  ????? ???????? ? ?????? ??????????? ??????, ??????? ????? ??????????? ??? "?????????? ??????????  ??????" ??? "??? ?????????? ????".  ????????? ???????????? ?????????, ??????? ???????? ???????? ?????? (??). ?? ?????? ????????? ? ????? ????????? ????? ? ????????? ????????.  ?????????? ???????? (??????? ??????) ?? ????????? ???????, ????? ??????, ??????? ?????? ?????????? ? ????? ??????.  ????? ?????? ???? ???????? ????? ? ?????? ????????? ? ??????????, ??????? ? ?????????? ????-????.  ????? ????? ? ?????????, ?????????, ????? ???, ???? ??? ????????, ??????????? ???? ? ??????? ??????????? ????. ??? ?????? ???????? ?? ????????? ? ????????? ?????????? ??????? ?? ????????, ??? ??????? ??????? ???????? (??????????? ??????), ?????????? ?????????? ?????? ? ????? ?????? ?????? ????. ???? ?? ????? ?????? ????? ??????, ?????????? ???????? ????????? ?????????? ?????? ?? ?????????? ??????. ??????????? ???????? ????? ????????? ?????????? ????????, ???:  ???????????? - ????? 5 ?? ? ????? ??????.  ? ????? ?????? ??????????? ?????? - 35 ?? ??? ?????? ? ????? ??????.  ? ?????? ??????????? ?????? - 140 ?? ??? ?????? ? ????? ??????.  ? ?????????? ??????????? ?????? - ?? 50% ?????? ?????? ? ????? ??????. ????????, ???? ? ?????? ????????, ??? ???????, ?????????? 300 ?? ??????, ?? ? ???????? ? ?????????? ??????????? ?????? ????? 150 ?? ??????.  ? ?????????? ??????????? ?????? - ?? 25% ?????? ?????? ? ????? ??????. ????????, ???? ? ?????? ????????, ??? ???????, ?????????? 400 ?? ??????, ?? ? ???????? ? ?????????? ??????????? ?????? ????? 300 ?? ??????. ????? ???????? ????? ????? ???????? ???? ? ?????? ??????????? ??????, ? ??? ????? ???????, ????????? ????? ??????? ? ????, ? ???? ?? ???????????? ????? ???????. ??????? ? ?????? ??????????? ??????. ????????? ??? ? ?????????? ???????. ???? ? ?????? ??????????? ??????.  ?????. ???????????? ??? ?????? ?????. ???????????????? ????? ? ?????? ??? ?????????? ??????????? ??????. ???????? ???? ? ????? ? ?????? ??? ?????????? ??????????? ??????. ???????? ? ?????? ???????  ???? ? ?????? ??? ?????????? ??????????? ??????.  ?????? ??????, ???????????? ? ???????????????? ??????. ????????? ???.  ???? ? ?????? ???????? ???????? ???????? ?? ????? ? ?????? ? ?????? ??????????? ??????. ?????? ??? ???????????? ????, ?????, ????????????, ????. ???? ???????. ????????? ?????. ??????? ????, ????? ? ???????? ??? ?????????? ????. ????????? ???????????????? ????. ???????? ???? ??? ????. ????.  ???????? ???????? ??????. ?????? ??????. ??????? (???). ???? ? ?????? ??? ?????????? ??????????? ??????. ??????.  ????????/????????? ????? ?????? ? ??????? ????? ? ??????. ?????????? ????????. ?????????? ??? ? ??????. ????? ??????? ? ??????? ? ?????? ??????????? ??????. ???????? ???.  ???? ? ?????. ???????? ??? ??????? ? ?????? ??????????? ????. ????????? ????????? ?????.  ?????? ????????? ??????? ? ??????????.  ????????????? ???? ?? ???????? ?????? ??????? ??????????????? ????????? ??????? ??? ????????. ? ????????? ?????? ????????? ??????????????????? ? ??????????. ????? ???????? ?? ?????????????? ???????? ???? ??????????? ?????????????. ??????? ????, ?????, ?????????? ?????. ????????. ????????????? ??????? ??? ?????????? ?????. ???-????? ???????? ????????????? (?????). ? ??????? ???????? ??? ???????????? ???????? ? ???. ???? ??? ???????????????????? ?????. ??????? ??????? ???????. ????? ??????? ???????????????? ?????. ??????? ???????????????? ???????? ???? ? ?????. ??????? ???????? ? ??????? ????. ???????????? ????? ? ??????. ??????? ?????????-???. ??????. ???????. ????????. ?????? (?????????) ???????. ??????. ???? ? ?????? ???????? ???????? ???????, ????????????????, ????????, ?????? ??? ???????????? ????, ???????????? ??? ????. ?????, ???????, ???????, ???-????, ????????, ???????????? ??????? ???? ? ??????????? ?????? ???????. ??????? ???????. ??????? ????. ???????????? ??????. ???????, ??????? ???????? ?? ?????, ??????. ??????? ?????. ???????? ???????? ????????? ??? ? ?????? ?????.  ?????? ??????. ??????? ??? ? ??????. ?????.  ????????/????????? ????? ??????? ? ????????? ????, ????????????? ????, ?????????? ????, ??????? ????. ???????????????? ? ??????????? ????????. ????????????? ????. ???-??? (????). ???? ??? ???????. ???? ???????. ?????? ????, ? ??? ????? ? ?????????? ??????????? ??????. ????????? ????. ???? ??? ?????? ????. ????????? ????. ??????????? ????. ????. ??????? ?????? ? ???????. ?????? ????????????? ? ?????????????. ????????? ??????. ?????? ????. ???? ???????. ????????. ???????? ????. ????????. ???? ? ?????. ??????? ???????? ????????. ??????? ????????? ?????. ????????. ???????? ?????. ????.  ?????? ???????????? ? ?????????? ?????. ?????????? ?????? ? ????????? ????????. ??????? ??????? ? ??????????. ???????????????? ??? ????? ????. ?????. ???????????? ??????? ? ???????? ?????? (? ????????).  ????????????? ???? ?? ???????? ?????? ??????? ?? ????????????? ????????? ??????? ??? ????????. ?????????? ? ????????? ??? ????????? ?????????????? ??????????.  Document Released: 09/08/2008 Document Revised: 10/01/2013 [facility] Patient Information 2015 [place]. This information is not intended to replace advice given to you by your health care provider. Make sure you discuss any questions you have with your health care provider.

## 2014-10-08 NOTE — Progress Notes (Signed)
Subjective:   C/o leg swelling x2 monhs - both; on a high salt diet all her life. Not taking Microzide... C/o rash B LEs x2-3 wks C/o wt gain, GERD. She quit smoking, now re-started  F/u neck pain and R shoulder pain F/u neck/shoulder and LBP pain and siffness - months Problems w/son     Hypertension Associated symptoms include anxiety and headaches. Pertinent negatives include no palpitations.  Back Pain Associated symptoms include headaches. Pertinent negatives include no abdominal pain, numbness or weakness.  Anxiety Symptoms include decreased concentration and nervous/anxious behavior. Patient reports no confusion, dizziness, nausea, palpitations or suicidal ideas.      BP Readings from Last 3 Encounters:  10/08/14 162/100  08/07/14 160/98  05/12/14 140/82   Wt Readings from Last 3 Encounters:  10/08/14 157 lb (71.215 kg)  08/07/14 152 lb (68.947 kg)  05/12/14 148 lb (67.132 kg)      Review of Systems  Constitutional: Positive for unexpected weight change. Negative for chills, activity change, appetite change and fatigue.  HENT: Negative for congestion, mouth sores and sinus pressure.   Eyes: Negative for visual disturbance.  Respiratory: Negative for cough and chest tightness.   Cardiovascular: Negative for palpitations.  Gastrointestinal: Negative for nausea and abdominal pain.  Genitourinary: Negative for frequency, difficulty urinating and vaginal pain.  Musculoskeletal: Positive for back pain. Negative for gait problem.  Skin: Positive for rash. Negative for pallor.  Neurological: Positive for headaches. Negative for dizziness, tremors, weakness and numbness.  Psychiatric/Behavioral: Positive for sleep disturbance and decreased concentration. Negative for suicidal ideas and confusion. The patient is nervous/anxious.        Objective:   Physical Exam  Constitutional: She appears well-developed. No distress.  HENT:  Head: Normocephalic.  Right Ear:  External ear normal.  Left Ear: External ear normal.  Nose: Nose normal.  Mouth/Throat: Oropharynx is clear and moist.  Eyes: Conjunctivae are normal. Pupils are equal, round, and reactive to light. Right eye exhibits no discharge. Left eye exhibits no discharge.  Neck: Normal range of motion. Neck supple. No JVD present. No tracheal deviation present. No thyromegaly present.  Cardiovascular: Normal rate, regular rhythm and normal heart sounds.   Pulmonary/Chest: No stridor. No respiratory distress. She has no wheezes.  Abdominal: Soft. Bowel sounds are normal. She exhibits no distension and no mass. There is no tenderness. There is no rebound and no guarding.  Musculoskeletal: She exhibits no edema or tenderness.  Lymphadenopathy:    She has no cervical adenopathy.  Neurological: She displays normal reflexes. No cranial nerve deficit. She exhibits normal muscle tone. Coordination normal.  Skin: No rash noted. No erythema.  Psychiatric: She has a normal mood and affect. Her behavior is normal. Judgment and thought content normal.  L knee is tender w/ROM LS is tender Psoriatic-type patches on B anterior shins  Lab Results  Component Value Date   WBC 6.5 06/28/2011   HGB 16.0* 06/28/2011   HCT 47.6* 06/28/2011   PLT 187.0 06/28/2011   GLUCOSE 77 10/07/2013   CHOL 190 04/24/2013   TRIG 149.0 04/24/2013   HDL 41.80 04/24/2013   LDLDIRECT 162.8 06/28/2011   LDLCALC 118* 04/24/2013   ALT 16 04/24/2013   AST 18 04/24/2013   NA 142 10/07/2013   K 4.2 10/07/2013   CL 104 10/07/2013   CREATININE 0.8 10/07/2013   BUN 10 10/07/2013   CO2 29 10/07/2013   TSH 1.53 06/28/2011   HGBA1C 5.6 01/02/2008  Assessment & Plan:

## 2014-10-08 NOTE — Assessment & Plan Note (Signed)
Chronic. 

## 2014-12-31 ENCOUNTER — Encounter: Payer: Self-pay | Admitting: Internal Medicine

## 2014-12-31 ENCOUNTER — Ambulatory Visit (INDEPENDENT_AMBULATORY_CARE_PROVIDER_SITE_OTHER): Payer: Medicare Other | Admitting: Internal Medicine

## 2014-12-31 VITALS — BP 130/72 | HR 88 | Temp 98.2°F | Wt 160.5 lb

## 2014-12-31 DIAGNOSIS — R030 Elevated blood-pressure reading, without diagnosis of hypertension: Secondary | ICD-10-CM

## 2014-12-31 DIAGNOSIS — IMO0001 Reserved for inherently not codable concepts without codable children: Secondary | ICD-10-CM

## 2014-12-31 DIAGNOSIS — R609 Edema, unspecified: Secondary | ICD-10-CM | POA: Diagnosis not present

## 2014-12-31 DIAGNOSIS — M129 Arthropathy, unspecified: Secondary | ICD-10-CM

## 2014-12-31 DIAGNOSIS — J449 Chronic obstructive pulmonary disease, unspecified: Secondary | ICD-10-CM | POA: Diagnosis not present

## 2014-12-31 MED ORDER — TRIAMCINOLONE ACETONIDE 0.5 % EX OINT: 1.0000 "application " | TOPICAL_OINTMENT | Freq: Three times a day (TID) | CUTANEOUS | Status: DC

## 2014-12-31 MED ORDER — OXYCODONE-ACETAMINOPHEN 10-325 MG PO TABS: 1.0000 | ORAL_TABLET | Freq: Four times a day (QID) | ORAL | Status: DC | PRN

## 2014-12-31 NOTE — Progress Notes (Signed)
Pre visit review using our clinic review tool, if applicable. No additional management support is needed unless otherwise documented below in the visit note. 

## 2015-01-03 IMAGING — CR DG KNEE STANDING AP BILAT
1 series · 1 of 1 positions shown · non-contrast
Comparison: No priors.

CLINICAL DATA: Knee pain for the past 6 months.

EXAM:
BILATERAL KNEES STANDING - 1 VIEW

[view not recorded]
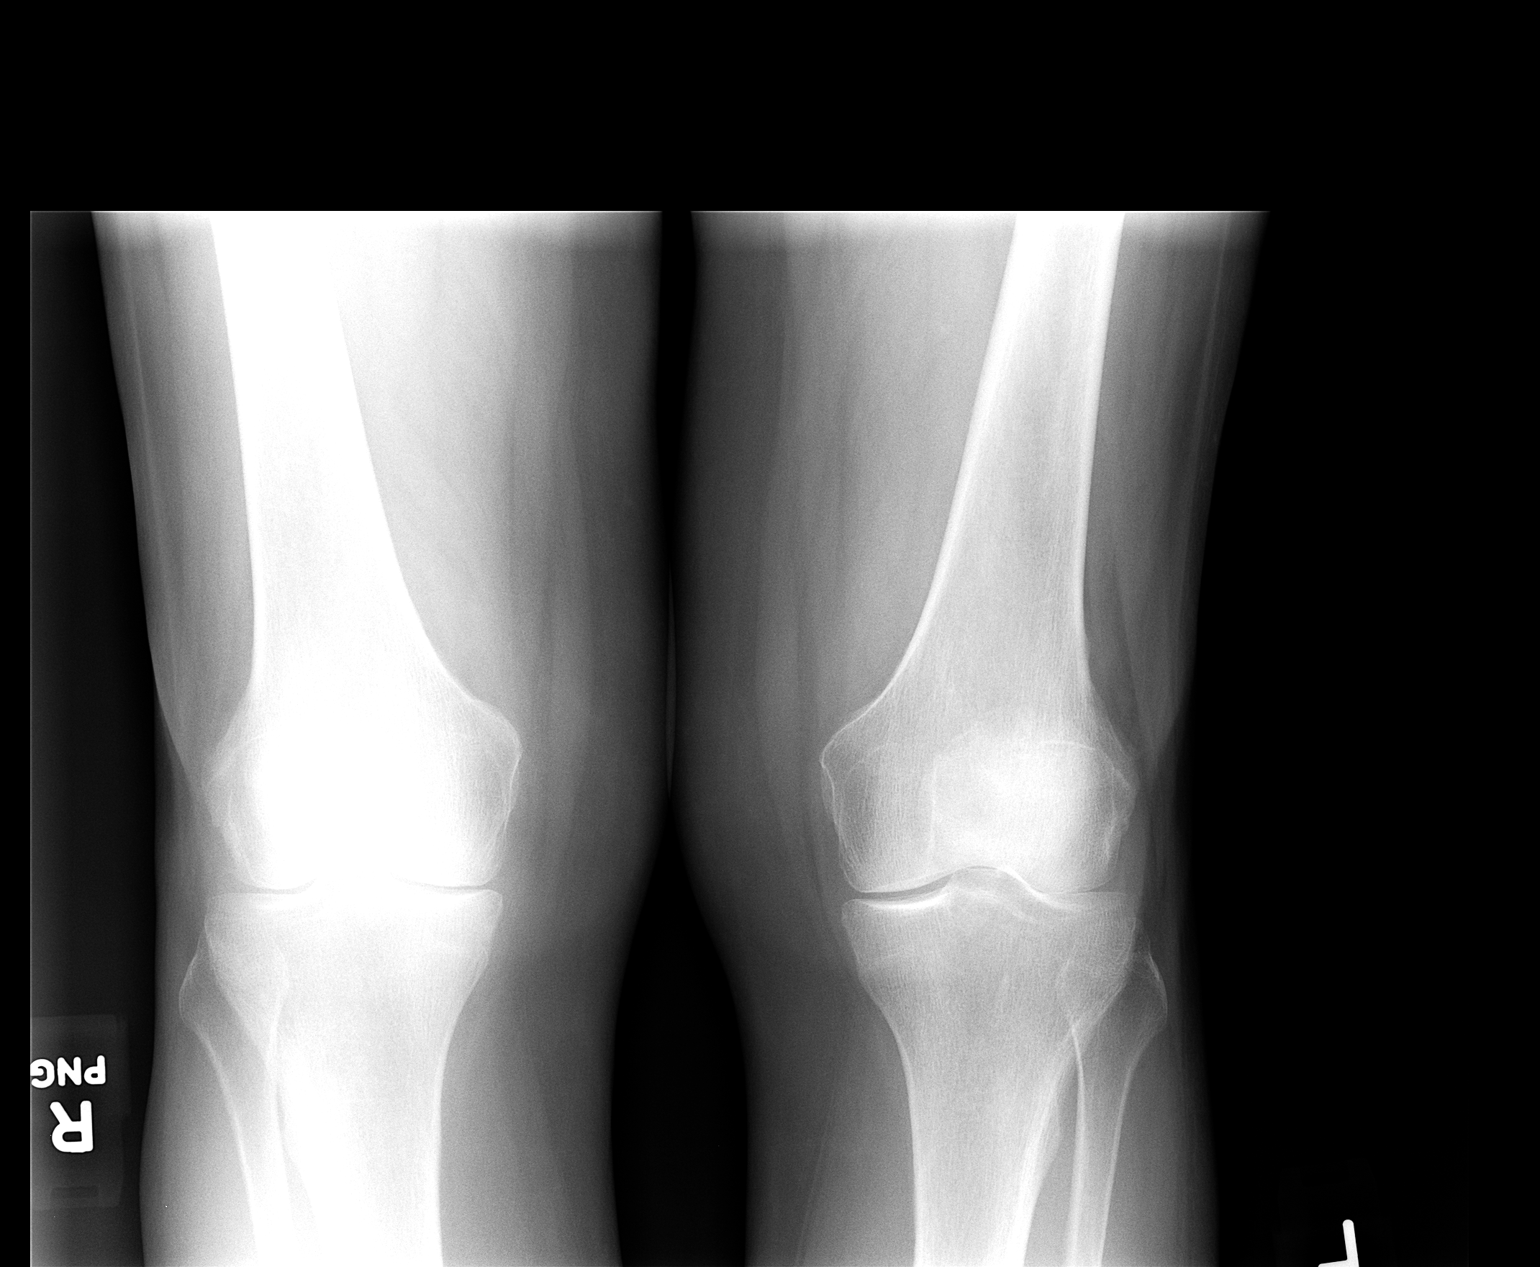

[1 of 1 positions shown; findings below may reference images not displayed]

FINDINGS: Single AP standing view of the knees demonstrates no definite acute
displaced fracture. There is joint space narrowing, there is joint
space narrowing and subchondral sclerosis in the knee joints
bilaterally, indicative of mild osteoarthritis.
IMPRESSION: 1. No acute abnormality of either knee on this limited frontal
projection.
2. Mild bilateral knee joint osteoarthritis.

## 2015-01-04 NOTE — Assessment & Plan Note (Signed)
Monitoring BP

## 2015-01-04 NOTE — Assessment & Plan Note (Signed)
Percocet prn  Potential benefits of a long term opioids use as well as potential risks (i.e. addiction risk, apnea etc) and complications (i.e. Somnolence, constipation and others) were explained to the patient and were aknowledged.  

## 2015-01-04 NOTE — Assessment & Plan Note (Signed)
elevate legs

## 2015-01-04 NOTE — Progress Notes (Signed)
   Subjective:   F/u  leg swelling x off and on - both legs; on a high salt diet all her life. Not taking Microzide... F/u rash B LEs x2-3 wks F/u wt gain, GERD. She quit smoking, now re-started again  F/u neck pain and R shoulder pain F/u neck/shoulder and LBP pain and siffness - months Problems w/son     HPI  BP Readings from Last 3 Encounters:  12/31/14 130/72  10/08/14 162/100  08/07/14 160/98   Wt Readings from Last 3 Encounters:  12/31/14 160 lb 8 oz (72.802 kg)  10/08/14 157 lb (71.215 kg)  08/07/14 152 lb (68.947 kg)      Review of Systems  Constitutional: Positive for unexpected weight change. Negative for chills, activity change, appetite change and fatigue.  HENT: Negative for congestion, mouth sores and sinus pressure.   Eyes: Negative for visual disturbance.  Respiratory: Negative for cough and chest tightness.   Genitourinary: Negative for frequency, difficulty urinating and vaginal pain.  Musculoskeletal: Negative for gait problem.  Skin: Positive for rash. Negative for pallor.  Neurological: Negative for tremors.  Psychiatric/Behavioral: Positive for sleep disturbance.       Objective:   Physical Exam  Constitutional: She appears well-developed. No distress.  HENT:  Head: Normocephalic.  Right Ear: External ear normal.  Left Ear: External ear normal.  Nose: Nose normal.  Mouth/Throat: Oropharynx is clear and moist.  Eyes: Conjunctivae are normal. Pupils are equal, round, and reactive to light. Right eye exhibits no discharge. Left eye exhibits no discharge.  Neck: Normal range of motion. Neck supple. No JVD present. No tracheal deviation present. No thyromegaly present.  Cardiovascular: Normal rate, regular rhythm and normal heart sounds.   Pulmonary/Chest: No stridor. No respiratory distress. She has no wheezes.  Abdominal: Soft. Bowel sounds are normal. She exhibits no distension and no mass. There is no tenderness. There is no rebound and no  guarding.  Musculoskeletal: She exhibits no edema or tenderness.  Lymphadenopathy:    She has no cervical adenopathy.  Neurological: She displays normal reflexes. No cranial nerve deficit. She exhibits normal muscle tone. Coordination normal.  Skin: No rash noted. No erythema.  Psychiatric: She has a normal mood and affect. Her behavior is normal. Judgment and thought content normal.  L knee is tender w/ROM LS is tender Psoriatic-type patches on B anterior shins  Lab Results  Component Value Date   WBC 6.5 06/28/2011   HGB 16.0* 06/28/2011   HCT 47.6* 06/28/2011   PLT 187.0 06/28/2011   GLUCOSE 77 10/07/2013   CHOL 190 04/24/2013   TRIG 149.0 04/24/2013   HDL 41.80 04/24/2013   LDLDIRECT 162.8 06/28/2011   LDLCALC 118* 04/24/2013   ALT 16 04/24/2013   AST 18 04/24/2013   NA 142 10/07/2013   K 4.2 10/07/2013   CL 104 10/07/2013   CREATININE 0.8 10/07/2013   BUN 10 10/07/2013   CO2 29 10/07/2013   TSH 1.53 06/28/2011   HGBA1C 5.6 01/02/2008          Assessment & Plan:

## 2015-01-04 NOTE — Assessment & Plan Note (Signed)
Proair MDI prn Advair bid Stop smoking

## 2015-02-06 ENCOUNTER — Ambulatory Visit (INDEPENDENT_AMBULATORY_CARE_PROVIDER_SITE_OTHER): Payer: Medicare Other | Admitting: Internal Medicine

## 2015-02-06 ENCOUNTER — Encounter: Payer: Self-pay | Admitting: Internal Medicine

## 2015-02-06 VITALS — BP 150/98 | HR 74 | Wt 156.0 lb

## 2015-02-06 DIAGNOSIS — Z634 Disappearance and death of family member: Principal | ICD-10-CM

## 2015-02-06 DIAGNOSIS — M542 Cervicalgia: Secondary | ICD-10-CM | POA: Diagnosis not present

## 2015-02-06 DIAGNOSIS — F4321 Adjustment disorder with depressed mood: Secondary | ICD-10-CM | POA: Diagnosis not present

## 2015-02-06 MED ORDER — AZITHROMYCIN 250 MG PO TABS: ORAL_TABLET | ORAL | Status: DC

## 2015-02-06 MED ORDER — PROMETHAZINE-CODEINE 6.25-10 MG/5ML PO SYRP: 5.0000 mL | ORAL_SOLUTION | ORAL | Status: DC | PRN

## 2015-02-06 MED ORDER — PANCRELIPASE (LIP-PROT-AMYL) 36000-114000 UNITS PO CPEP: 36000.0000 [IU] | ORAL_CAPSULE | Freq: Three times a day (TID) | ORAL | Status: DC

## 2015-02-06 MED ORDER — FLUTICASONE-SALMETEROL 250-50 MCG/DOSE IN AEPB: 1.0000 | INHALATION_SPRAY | Freq: Two times a day (BID) | RESPIRATORY_TRACT | Status: DC

## 2015-02-06 MED ORDER — ALBUTEROL SULFATE HFA 108 (90 BASE) MCG/ACT IN AERS: 2.0000 | INHALATION_SPRAY | Freq: Four times a day (QID) | RESPIRATORY_TRACT | Status: DC | PRN

## 2015-02-06 NOTE — Progress Notes (Signed)
Pre visit review using our clinic review tool, if applicable. No additional management support is needed unless otherwise documented below in the visit note. 

## 2015-02-06 NOTE — Progress Notes (Signed)
   Subjective:   Younger son Theresa Aguirre committed a suicide (overdose) - 01/20/15  C/o cough  F/u  leg swelling x off and on - both legs; on a high salt diet all her life. Not taking Microzide... F/u rash B LEs x2-3 wks F/u wt gain, GERD. She quit smoking, now re-started again 1/2 ppd  F/u neck pain and R shoulder pain F/u neck/shoulder and LBP pain and siffness - months      Cough Associated symptoms include a rash and wheezing. Pertinent negatives include no chills.  Wheezing  Associated symptoms include coughing and a rash. Pertinent negatives include no chills.    BP Readings from Last 3 Encounters:  02/06/15 150/98  12/31/14 130/72  10/08/14 162/100   Wt Readings from Last 3 Encounters:  02/06/15 156 lb (70.761 kg)  12/31/14 160 lb 8 oz (72.802 kg)  10/08/14 157 lb (71.215 kg)      Review of Systems  Constitutional: Positive for unexpected weight change. Negative for chills, activity change, appetite change and fatigue.  HENT: Negative for congestion, mouth sores and sinus pressure.   Eyes: Negative for visual disturbance.  Respiratory: Positive for cough and wheezing. Negative for chest tightness.   Genitourinary: Negative for frequency, difficulty urinating and vaginal pain.  Musculoskeletal: Negative for gait problem.  Skin: Positive for rash. Negative for pallor.  Neurological: Negative for tremors.  Psychiatric/Behavioral: Positive for sleep disturbance.       Objective:   Physical Exam  Constitutional: She appears well-developed. No distress.  HENT:  Head: Normocephalic.  Right Ear: External ear normal.  Left Ear: External ear normal.  Nose: Nose normal.  Mouth/Throat: Oropharynx is clear and moist.  Eyes: Conjunctivae are normal. Pupils are equal, round, and reactive to light. Right eye exhibits no discharge. Left eye exhibits no discharge.  Neck: Normal range of motion. Neck supple. No JVD present. No tracheal deviation present. No thyromegaly  present.  Cardiovascular: Normal rate, regular rhythm and normal heart sounds.   Pulmonary/Chest: No stridor. No respiratory distress. She has no wheezes.  Abdominal: Soft. Bowel sounds are normal. She exhibits no distension and no mass. There is no tenderness. There is no rebound and no guarding.  Musculoskeletal: She exhibits no edema or tenderness.  Lymphadenopathy:    She has no cervical adenopathy.  Neurological: She displays normal reflexes. No cranial nerve deficit. She exhibits normal muscle tone. Coordination normal.  Skin: No rash noted. No erythema.  Psychiatric: She has a normal mood and affect. Her behavior is normal. Judgment and thought content normal.  L knee is tender w/ROM LS is tender Psoriatic-type patches on B anterior shins Sad  Lab Results  Component Value Date   WBC 6.5 06/28/2011   HGB 16.0* 06/28/2011   HCT 47.6* 06/28/2011   PLT 187.0 06/28/2011   GLUCOSE 77 10/07/2013   CHOL 190 04/24/2013   TRIG 149.0 04/24/2013   HDL 41.80 04/24/2013   LDLDIRECT 162.8 06/28/2011   LDLCALC 118* 04/24/2013   ALT 16 04/24/2013   AST 18 04/24/2013   NA 142 10/07/2013   K 4.2 10/07/2013   CL 104 10/07/2013   CREATININE 0.8 10/07/2013   BUN 10 10/07/2013   CO2 29 10/07/2013   TSH 1.53 06/28/2011   HGBA1C 5.6 01/02/2008          Assessment & Plan:

## 2015-02-06 NOTE — Assessment & Plan Note (Signed)
Younger son Theresa Aguirre committed a suicide (overdose) - 01/20/15

## 2015-02-08 NOTE — Assessment & Plan Note (Signed)
Chronic OA Percocet prn  Potential benefits of a long term opioids use as well as potential risks (i.e. addiction risk, apnea etc) and complications (i.e. Somnolence, constipation and others) were explained to the patient and were aknowledged.

## 2015-03-11 ENCOUNTER — Other Ambulatory Visit: Payer: Self-pay | Admitting: Internal Medicine

## 2015-03-11 DIAGNOSIS — I6523 Occlusion and stenosis of bilateral carotid arteries: Secondary | ICD-10-CM

## 2015-03-13 ENCOUNTER — Other Ambulatory Visit: Payer: Self-pay | Admitting: Internal Medicine

## 2015-03-16 NOTE — Telephone Encounter (Signed)
Called refill into [facility] had to leave on pharmacy vm...Theresa Aguirre

## 2015-03-18 ENCOUNTER — Ambulatory Visit (HOSPITAL_COMMUNITY): Payer: BLUE CROSS/BLUE SHIELD

## 2015-03-19 ENCOUNTER — Ambulatory Visit (HOSPITAL_COMMUNITY): Payer: Medicare Other | Attending: Internal Medicine

## 2015-03-19 DIAGNOSIS — I6523 Occlusion and stenosis of bilateral carotid arteries: Secondary | ICD-10-CM | POA: Insufficient documentation

## 2015-03-25 ENCOUNTER — Encounter: Payer: Self-pay | Admitting: Family Medicine

## 2015-03-25 ENCOUNTER — Ambulatory Visit (INDEPENDENT_AMBULATORY_CARE_PROVIDER_SITE_OTHER): Payer: Medicare Other | Admitting: Family Medicine

## 2015-03-25 ENCOUNTER — Other Ambulatory Visit (INDEPENDENT_AMBULATORY_CARE_PROVIDER_SITE_OTHER): Payer: Medicare Other

## 2015-03-25 VITALS — BP 118/68 | HR 73 | Ht 65.0 in | Wt 158.0 lb

## 2015-03-25 DIAGNOSIS — M79642 Pain in left hand: Secondary | ICD-10-CM

## 2015-03-25 DIAGNOSIS — I6523 Occlusion and stenosis of bilateral carotid arteries: Secondary | ICD-10-CM | POA: Diagnosis not present

## 2015-03-25 DIAGNOSIS — M6530 Trigger finger, unspecified finger: Secondary | ICD-10-CM

## 2015-03-25 NOTE — Assessment & Plan Note (Signed)
Patient was given an injection today. Patient will do some bracing at night. We discussed manual massage as well as icing. Patient will follow-up in 3 weeks if not completely resolved.

## 2015-03-25 NOTE — Progress Notes (Signed)
  [facility] [address], [place] [address] Phone: [phone] Subjective:     CC: Left finger pain  IRJ:JOACZYSAYT Theresa Aguirre is a 69 y.o. female coming in with complaint of left finger pain. States that this is the fourth finger. Has been going on for multiple months but now beginning the way of her doing her job. Impression and virtually has pain with certain activities as well. Patient states when she wakes up in the morning the finger gets stuck in one shoe. Denies any numbness or tingling. Patient though unfortunately has had a lot of stress recently and she's not been able to take care of herself. Also her husband had an cardiac surgery recently saw she has been her primary caregiver. Lateral other stressors in her life as well. A Shon Baton still needs to continue working and is finding it difficult secondary to this discomfort.         Past medical history, social, surgical and family history all reviewed in electronic medical record.   Review of Systems: No headache, visual changes, nausea, vomiting, diarrhea, constipation, dizziness, abdominal pain, skin rash, fevers, chills, night sweats, weight loss, swollen lymph nodes, body aches, joint swelling, muscle aches, chest pain, shortness of breath, mood changes.   Objective Blood pressure 118/68, pulse 73, height 5\' 5"  (1.651 m), weight 158 lb (71.668 kg), SpO2 96 %.  General: No apparent distress alert and oriented x3 mood and affect normal, dressed appropriately.  HEENT: Pupils equal, extraocular movements intact  Respiratory: Patient's speak in full sentences and does not appear short of breath  Cardiovascular: No lower extremity edema, non tender, no erythema  Skin: Warm dry intact with no signs of infection or rash on extremities or on axial skeleton.  Abdomen: Soft nontender  Neuro: Cranial nerves II through XII are intact, neurovascularly intact in all extremities with 2+ DTRs and 2+  pulses.  Lymph: No lymphadenopathy of posterior or anterior cervical chain or axillae bilaterally.  Gait normal with good balance and coordination.  MSK:  Non tender with full range of motion and good stability and symmetric strength and tone of shoulders, elbows, wrist, hip, and ankles bilaterally.  Left hand exam shows the patient does have a trigger finger of the fourth finger.    Procedure: Real-time Ultrasound Guided Injection of left fourth finger tendon sheath nodule Device: GE Logiq E  Ultrasound guided injection is preferred based studies that show increased duration, increased effect, greater accuracy, decreased procedural pain, increased response rate, and decreased cost with ultrasound guided versus blind injection.  Verbal informed consent obtained.  Time-out conducted.  Noted no overlying erythema, induration, or other signs of local infection.  Skin prepped in a sterile fashion.  Local anesthesia: Topical Ethyl chloride.  With sterile technique and under real time ultrasound guidance:  With a 25-gauge 1 inch needle patient was injected with 0.5 mL of 0.5% Marcaine and 0.5 mL of Kenalog 40 mg/dL. Completed without difficulty  Pain immediately resolved suggesting accurate placement of the medication.  Advised to call if fevers/chills, erythema, induration, drainage, or persistent bleeding.  Images permanently stored and available for review in the ultrasound unit.  Impression: Technically successful ultrasound guided injection..   Impression and Recommendations:     This case required medical decision making of moderate complexity.

## 2015-03-25 NOTE — Progress Notes (Signed)
Pre visit review using our clinic review tool, if applicable. No additional management support is needed unless otherwise documented below in the visit note. 

## 2015-03-25 NOTE — Patient Instructions (Signed)
Good to see you Ice is your friend Wear splint every night for next 2 weeks  See me when you need me

## 2015-04-21 ENCOUNTER — Encounter: Payer: Self-pay | Admitting: Internal Medicine

## 2015-04-21 ENCOUNTER — Ambulatory Visit (INDEPENDENT_AMBULATORY_CARE_PROVIDER_SITE_OTHER): Payer: Medicare Other | Admitting: Internal Medicine

## 2015-04-21 VITALS — BP 142/82 | HR 81 | Wt 157.0 lb

## 2015-04-21 DIAGNOSIS — I6523 Occlusion and stenosis of bilateral carotid arteries: Secondary | ICD-10-CM | POA: Diagnosis not present

## 2015-04-21 DIAGNOSIS — F4321 Adjustment disorder with depressed mood: Secondary | ICD-10-CM | POA: Diagnosis not present

## 2015-04-21 DIAGNOSIS — IMO0001 Reserved for inherently not codable concepts without codable children: Secondary | ICD-10-CM

## 2015-04-21 DIAGNOSIS — Z634 Disappearance and death of family member: Principal | ICD-10-CM

## 2015-04-21 DIAGNOSIS — R0789 Other chest pain: Secondary | ICD-10-CM | POA: Diagnosis not present

## 2015-04-21 DIAGNOSIS — R1033 Periumbilical pain: Secondary | ICD-10-CM

## 2015-04-21 DIAGNOSIS — J449 Chronic obstructive pulmonary disease, unspecified: Secondary | ICD-10-CM | POA: Diagnosis not present

## 2015-04-21 DIAGNOSIS — R030 Elevated blood-pressure reading, without diagnosis of hypertension: Secondary | ICD-10-CM | POA: Diagnosis not present

## 2015-04-21 DIAGNOSIS — R072 Precordial pain: Secondary | ICD-10-CM

## 2015-04-21 MED ORDER — ASPIRIN 325 MG PO TABS: 162.0000 mg | ORAL_TABLET | Freq: Every day | ORAL | Status: AC

## 2015-04-21 MED ORDER — ESCITALOPRAM OXALATE 5 MG PO TABS: 5.0000 mg | ORAL_TABLET | Freq: Every day | ORAL | Status: DC

## 2015-04-21 NOTE — Progress Notes (Signed)
Subjective:  Patient ID: Theresa Aguirre, female    DOB: 17-Jun-1946  Age: 69 y.o. MRN: [id number]  CC: No chief complaint on file.   HPI Sol Odor presents for COPD, anxiety, grief, LBP, OA. C/o epigastric pain, CP. CP is worse w/stress. C/o nausea x months - worse Pt is moving to [place], [facility]  Outpatient Prescriptions Prior to Visit  Medication Sig Dispense Refill  . albuterol (PROVENTIL HFA;VENTOLIN HFA) 108 (90 BASE) MCG/ACT inhaler Inhale 2 puffs into the lungs every 6 (six) hours as needed. 1 Inhaler 11  . ALPRAZolam (XANAX) 1 MG tablet TAKE 1 TABLET BY MOUTH FOUR TIMES DAILY AS NEEDED FOR SLEEP OR ANXIETY 360 tablet 1  . Diethylpropion HCl 25 MG TABS 1 po tid 90 tablet 1  . esomeprazole (NEXIUM) 40 MG capsule Take 1 capsule (40 mg total) by mouth daily. 30 capsule 5  . fluticasone (FLONASE) 50 MCG/ACT nasal spray Place 2 sprays into both nostrils daily. 16 g 6  . Fluticasone-Salmeterol (ADVAIR DISKUS) 250-50 MCG/DOSE AEPB Inhale 1 puff into the lungs every 12 (twelve) hours. 60 each 11  . hydrochlorothiazide (MICROZIDE) 12.5 MG capsule Take 1 capsule (12.5 mg total) by mouth daily. 30 capsule 11  . lipase/protease/amylase (CREON) 36000 UNITS CPEP capsule Take 1 capsule (36,000 Units total) by mouth 3 (three) times daily before meals. 90 capsule 11  . temazepam (RESTORIL) 15 MG capsule TAKE 1-2 CAPSULES BY MOUTH EVERY NIGHT AT BEDTIME AS NEEDED FOR INSOMNIA 180 capsule 1  . tiotropium (SPIRIVA) 18 MCG inhalation capsule Place 18 mcg into inhaler and inhale daily.      Marland Kitchen triamcinolone ointment (KENALOG) 0.5 % Apply 1 application topically 3 (three) times daily. 60 g 0  . aspirin (BAYER ASPIRIN) 325 MG tablet Take 1 tablet (325 mg total) by mouth daily. 100 tablet 3  . azithromycin (ZITHROMAX) 250 MG tablet As directed 6 tablet 0  . prednisoLONE 5 MG TABS tablet Take 2 tablets (10 mg total) by mouth daily. 100 each 3  . promethazine-codeine (PHENERGAN WITH CODEINE) 6.25-10  MG/5ML syrup Take 5 mLs by mouth every 4 (four) hours as needed. 300 mL 0  . buPROPion (WELLBUTRIN SR) 150 MG 12 hr tablet TAKE 1 TABLET BY MOUTH TWICE DAILY (Patient not taking: Reported on 04/21/2015) 180 tablet 1  . oxyCODONE-acetaminophen (PERCOCET) 10-325 MG per tablet Take 1 tablet by mouth every 6 (six) hours as needed. Fill on 01/30/15 120 tablet 0   No facility-administered medications prior to visit.    ROS Review of Systems  Constitutional: Negative for chills, activity change, appetite change, fatigue and unexpected weight change.  HENT: Negative for congestion, mouth sores and sinus pressure.   Eyes: Negative for visual disturbance.  Respiratory: Positive for chest tightness, shortness of breath and wheezing. Negative for cough.   Gastrointestinal: Positive for nausea and constipation. Negative for vomiting, abdominal pain and diarrhea.  Genitourinary: Negative for frequency, difficulty urinating and vaginal pain.  Musculoskeletal: Positive for myalgias, back pain and arthralgias. Negative for gait problem.  Skin: Negative for pallor and rash.  Neurological: Negative for dizziness, tremors, weakness, numbness and headaches.  Psychiatric/Behavioral: Negative for confusion and sleep disturbance.    Objective:  BP 142/82 mmHg  Pulse 81  Wt 157 lb (71.215 kg)  SpO2 94%  BP Readings from Last 3 Encounters:  04/21/15 142/82  03/25/15 118/68  02/06/15 150/98    Wt Readings from Last 3 Encounters:  04/21/15 157 lb (71.215 kg)  03/25/15 158 lb (  71.668 kg)  02/06/15 156 lb (70.761 kg)    Physical Exam  Constitutional: She appears well-developed. No distress.  HENT:  Head: Normocephalic.  Right Ear: External ear normal.  Left Ear: External ear normal.  Nose: Nose normal.  Mouth/Throat: Oropharynx is clear and moist.  Eyes: Conjunctivae are normal. Pupils are equal, round, and reactive to light. Right eye exhibits no discharge. Left eye exhibits no discharge.  Neck:  Normal range of motion. Neck supple. No JVD present. No tracheal deviation present. No thyromegaly present.  Cardiovascular: Normal rate, regular rhythm and normal heart sounds.   Pulmonary/Chest: No stridor. No respiratory distress. She has no wheezes.  Abdominal: Soft. Bowel sounds are normal. She exhibits no distension and no mass. There is no tenderness. There is no rebound and no guarding.  Musculoskeletal: She exhibits no edema or tenderness.  Lymphadenopathy:    She has no cervical adenopathy.  Neurological: She displays normal reflexes. No cranial nerve deficit. She exhibits normal muscle tone. Coordination normal.  Skin: No rash noted. No erythema.  Psychiatric: She has a normal mood and affect. Her behavior is normal. Judgment and thought content normal.    Lab Results  Component Value Date   WBC 6.5 06/28/2011   HGB 16.0* 06/28/2011   HCT 47.6* 06/28/2011   PLT 187.0 06/28/2011   GLUCOSE 77 10/07/2013   CHOL 190 04/24/2013   TRIG 149.0 04/24/2013   HDL 41.80 04/24/2013   LDLDIRECT 162.8 06/28/2011   LDLCALC 118* 04/24/2013   ALT 16 04/24/2013   AST 18 04/24/2013   NA 142 10/07/2013   K 4.2 10/07/2013   CL 104 10/07/2013   CREATININE 0.8 10/07/2013   BUN 10 10/07/2013   CO2 29 10/07/2013   TSH 1.53 06/28/2011   HGBA1C 5.6 01/02/2008    Dg Knee Bilateral Standing Ap  05/12/2014   CLINICAL DATA:  Knee pain for the past 6 months.  EXAM: BILATERAL KNEES STANDING - 1 VIEW  COMPARISON:  No priors.  FINDINGS: Single AP standing view of the knees demonstrates no definite acute displaced fracture. There is joint space narrowing, there is joint space narrowing and subchondral sclerosis in the knee joints bilaterally, indicative of mild osteoarthritis.  IMPRESSION: 1. No acute abnormality of either knee on this limited frontal projection. 2. Mild bilateral knee joint osteoarthritis.   Electronically Signed   By: Vinnie Langton M.D.   On: 05/12/2014 09:52   [place] Extrem Low Right  Ltd  05/12/2014   MSK US performed of: Right knee  This study was ordered, performed, and interpreted by Charlann Boxer D.O.  Knee:  All structures visualized.  Anteromedial, anterolateral, posteromedial, and posterolateral menisci  unremarkable without tearing, fraying, effusion, or displacement. Mild  degenerative changes noted mild to moderate narrowing of the medial and  lateral joint space.  Patellar Tendon unremarkable on long and transverse views without  effusion.  No abnormality of prepatellar bursa.  LCL and MCL unremarkable on long and transverse views.  No abnormality of origin of medial or lateral head of the gastrocnemius.   05/12/2014   Mild to moderate osteoarthritic changes   EKG ok Assessment & Plan:   Diagnoses and all orders for this visit:  Grief at loss of child  Elevated blood pressure  COPD, moderate  Chest wall pain Orders: -     EKG 12-Lead  Other orders -     aspirin (BAYER ASPIRIN) 325 MG tablet; Take 0.5 tablets (162 mg total) by mouth daily. -  escitalopram (LEXAPRO) 5 MG tablet; Take 1 tablet (5 mg total) by mouth daily. -     US Abdomen Complete -     Myocardial Perfusion Imaging; Future   I have discontinued Ms. Amsden's prednisoLONE, buPROPion, oxyCODONE-acetaminophen, promethazine-codeine, azithromycin, and oxyCODONE-acetaminophen. I have also changed her aspirin. Additionally, I am having her start on escitalopram. Lastly, I am having her maintain her tiotropium, fluticasone, Diethylpropion HCl, esomeprazole, ALPRAZolam, hydrochlorothiazide, triamcinolone ointment, Fluticasone-Salmeterol, albuterol, lipase/protease/amylase, and temazepam.  Meds ordered this encounter  Medications  . DISCONTD: oxyCODONE-acetaminophen (PERCOCET) 10-325 MG per tablet    Sig: as needed.  Marland Kitchen aspirin (BAYER ASPIRIN) 325 MG tablet    Sig: Take 0.5 tablets (162 mg total) by mouth daily.    Dispense:  100 tablet    Refill:  3  . escitalopram (LEXAPRO) 5 MG tablet    Sig:  Take 1 tablet (5 mg total) by mouth daily.    Dispense:  90 tablet    Refill:  1     Follow-up: No Follow-up on file.  Walker Kehr, MD

## 2015-04-21 NOTE — Progress Notes (Signed)
Pre visit review using our clinic review tool, if applicable. No additional management support is needed unless otherwise documented below in the visit note. 

## 2015-04-21 NOTE — Assessment & Plan Note (Signed)
Doing well 

## 2015-04-21 NOTE — Assessment & Plan Note (Signed)
Discussed Start Lexapro Pt is moving to [place], [facility]

## 2015-04-21 NOTE — Assessment & Plan Note (Signed)
Discussed.

## 2015-05-01 ENCOUNTER — Telehealth: Payer: Self-pay

## 2015-05-01 ENCOUNTER — Other Ambulatory Visit (INDEPENDENT_AMBULATORY_CARE_PROVIDER_SITE_OTHER): Payer: Medicare Other

## 2015-05-01 ENCOUNTER — Ambulatory Visit
Admission: RE | Admit: 2015-05-01 | Discharge: 2015-05-01 | Disposition: A | Discharge status: Home / Self Care | Payer: Medicare Other | Source: Ambulatory Visit | Attending: Internal Medicine | Admitting: Internal Medicine

## 2015-05-01 DIAGNOSIS — E7800 Pure hypercholesterolemia, unspecified: Secondary | ICD-10-CM

## 2015-05-01 DIAGNOSIS — E785 Hyperlipidemia, unspecified: Secondary | ICD-10-CM

## 2015-05-01 DIAGNOSIS — Z0000 Encounter for general adult medical examination without abnormal findings: Secondary | ICD-10-CM

## 2015-05-01 LAB — BASIC METABOLIC PANEL
BUN: 11 mg/dL (ref 6–23)
CALCIUM: 9.8 mg/dL (ref 8.4–10.5)
CO2: 30 mEq/L (ref 19–32)
CREATININE: 0.69 mg/dL (ref 0.40–1.20)
Chloride: 102 mEq/L (ref 96–112)
GFR: 89.47 mL/min (ref >60.00)
Glucose, Bld: 112 mg/dL — ABNORMAL HIGH (ref 70–99)
Potassium: 4.1 mEq/L (ref 3.5–5.1)
Sodium: 140 mEq/L (ref 135–145)

## 2015-05-01 LAB — LIPID PANEL
Cholesterol: 199 mg/dL (ref 0–200)
HDL: 42 mg/dL (ref >39.00)
LDL CALC: 139 mg/dL — AB (ref 0–99)
NonHDL: 157
TRIGLYCERIDES: 92 mg/dL (ref 0.0–149.0)
Total CHOL/HDL Ratio: 5
VLDL: 18.4 mg/dL (ref 0.0–40.0)

## 2015-05-01 LAB — HEPATIC FUNCTION PANEL
ALT: 34 U/L (ref 0–35)
AST: 31 U/L (ref 0–37)
Albumin: 4.4 g/dL (ref 3.5–5.2)
Alkaline Phosphatase: 60 U/L (ref 39–117)
BILIRUBIN TOTAL: 0.7 mg/dL (ref 0.2–1.2)
Bilirubin, Direct: 0.1 mg/dL (ref 0.0–0.3)
TOTAL PROTEIN: 7.2 g/dL (ref 6.0–8.3)

## 2015-05-01 LAB — URINALYSIS, ROUTINE W REFLEX MICROSCOPIC
BILIRUBIN URINE: NEGATIVE
Hgb urine dipstick: NEGATIVE
Ketones, ur: NEGATIVE
Leukocytes, UA: NEGATIVE
NITRITE: NEGATIVE
RBC / HPF: NONE SEEN (ref 0–?)
SPECIFIC GRAVITY, URINE: 1.02 (ref 1.000–1.030)
TOTAL PROTEIN, URINE-UPE24: NEGATIVE
Urine Glucose: NEGATIVE
Urobilinogen, UA: 0.2 (ref 0.0–1.0)
WBC, UA: NONE SEEN (ref 0–?)
pH: 6.5 (ref 5.0–8.0)

## 2015-05-01 NOTE — Telephone Encounter (Signed)
Pt is in the lab and stated that she was told to go to the lab to have blood work done.

## 2015-05-18 ENCOUNTER — Telehealth (HOSPITAL_COMMUNITY): Payer: Self-pay

## 2015-05-18 NOTE — Telephone Encounter (Signed)
Encounter complete. 

## 2015-05-19 ENCOUNTER — Telehealth (HOSPITAL_COMMUNITY): Payer: Self-pay

## 2015-05-19 NOTE — Telephone Encounter (Signed)
Left message on voicemail in reference to upcoming appointment scheduled for 05-20-2015. Phone number given for a call back so details instructions can be given. Oletta Lamas, Jahden Schara A

## 2015-05-20 ENCOUNTER — Ambulatory Visit (HOSPITAL_COMMUNITY): Payer: Medicare Other | Attending: Cardiology

## 2015-05-20 ENCOUNTER — Telehealth (HOSPITAL_COMMUNITY): Payer: Self-pay | Admitting: *Deleted

## 2015-05-20 NOTE — Telephone Encounter (Signed)
Patient was a no show today for nuclear stress test. Left message for patient to call back to reschedule. Patient called back and stated it was suppose to be cancelled. She will call back to reschedule after she gets her work schedule.Clayton Bosserman, Ranae Palms

## 2015-08-05 ENCOUNTER — Ambulatory Visit: Payer: Medicare Other | Admitting: Internal Medicine

## 2015-08-07 ENCOUNTER — Telehealth: Payer: Self-pay | Admitting: Internal Medicine

## 2015-08-07 NOTE — Telephone Encounter (Signed)
Patient states husband has appointment with Dr. Alain Marion on 11/8th at 3:45.  She is requesting to double book that slot with an appointment for her as well.  Please advise.

## 2015-08-07 NOTE — Telephone Encounter (Signed)
Got scheduled  °

## 2015-08-07 NOTE — Telephone Encounter (Signed)
Ok Thx 

## 2015-08-18 ENCOUNTER — Encounter: Payer: Self-pay | Admitting: Internal Medicine

## 2015-08-18 ENCOUNTER — Ambulatory Visit (INDEPENDENT_AMBULATORY_CARE_PROVIDER_SITE_OTHER): Payer: Medicare Other | Admitting: Internal Medicine

## 2015-08-18 VITALS — BP 130/82 | HR 84 | Wt 152.0 lb

## 2015-08-18 DIAGNOSIS — Z23 Encounter for immunization: Secondary | ICD-10-CM

## 2015-08-18 DIAGNOSIS — F4323 Adjustment disorder with mixed anxiety and depressed mood: Secondary | ICD-10-CM

## 2015-08-18 DIAGNOSIS — I6523 Occlusion and stenosis of bilateral carotid arteries: Secondary | ICD-10-CM | POA: Diagnosis not present

## 2015-08-18 DIAGNOSIS — IMO0001 Reserved for inherently not codable concepts without codable children: Secondary | ICD-10-CM

## 2015-08-18 DIAGNOSIS — F4321 Adjustment disorder with depressed mood: Secondary | ICD-10-CM | POA: Diagnosis not present

## 2015-08-18 DIAGNOSIS — J449 Chronic obstructive pulmonary disease, unspecified: Secondary | ICD-10-CM

## 2015-08-18 DIAGNOSIS — R030 Elevated blood-pressure reading, without diagnosis of hypertension: Secondary | ICD-10-CM

## 2015-08-18 DIAGNOSIS — Z634 Disappearance and death of family member: Secondary | ICD-10-CM

## 2015-08-18 MED ORDER — VENLAFAXINE HCL ER 37.5 MG PO CP24: 37.5000 mg | ORAL_CAPSULE | Freq: Every day | ORAL | Status: DC

## 2015-08-18 MED ORDER — FLUTICASONE-SALMETEROL 250-50 MCG/DOSE IN AEPB: 1.0000 | INHALATION_SPRAY | Freq: Two times a day (BID) | RESPIRATORY_TRACT | Status: DC

## 2015-08-18 NOTE — Progress Notes (Signed)
Pre visit review using our clinic review tool, if applicable. No additional management support is needed unless otherwise documented below in the visit note. 

## 2015-08-20 NOTE — Progress Notes (Signed)
Subjective:  Patient ID: Theresa Aguirre, female    DOB: [birth year]  Age: 69 y.o. MRN: [id number]  CC: No chief complaint on file.   HPI Theresa Aguirre presents for anxiety, depression, insomnia, grief f/u  Outpatient Prescriptions Prior to Visit  Medication Sig Dispense Refill  . albuterol (PROVENTIL HFA;VENTOLIN HFA) 108 (90 BASE) MCG/ACT inhaler Inhale 2 puffs into the lungs every 6 (six) hours as needed. 1 Inhaler 11  . ALPRAZolam (XANAX) 1 MG tablet TAKE 1 TABLET BY MOUTH FOUR TIMES DAILY AS NEEDED FOR SLEEP OR ANXIETY 360 tablet 1  . aspirin (BAYER ASPIRIN) 325 MG tablet Take 0.5 tablets (162 mg total) by mouth daily. 100 tablet 3  . Diethylpropion HCl 25 MG TABS 1 po tid 90 tablet 1  . esomeprazole (NEXIUM) 40 MG capsule Take 1 capsule (40 mg total) by mouth daily. 30 capsule 5  . fluticasone (FLONASE) 50 MCG/ACT nasal spray Place 2 sprays into both nostrils daily. 16 g 6  . hydrochlorothiazide (MICROZIDE) 12.5 MG capsule Take 1 capsule (12.5 mg total) by mouth daily. 30 capsule 11  . lipase/protease/amylase (CREON) 36000 UNITS CPEP capsule Take 1 capsule (36,000 Units total) by mouth 3 (three) times daily before meals. 90 capsule 11  . temazepam (RESTORIL) 15 MG capsule TAKE 1-2 CAPSULES BY MOUTH EVERY NIGHT AT BEDTIME AS NEEDED FOR INSOMNIA 180 capsule 1  . tiotropium (SPIRIVA) 18 MCG inhalation capsule Place 18 mcg into inhaler and inhale daily.      Marland Kitchen escitalopram (LEXAPRO) 5 MG tablet Take 1 tablet (5 mg total) by mouth daily. 90 tablet 1  . Fluticasone-Salmeterol (ADVAIR DISKUS) 250-50 MCG/DOSE AEPB Inhale 1 puff into the lungs every 12 (twelve) hours. 60 each 11  . triamcinolone ointment (KENALOG) 0.5 % Apply 1 application topically 3 (three) times daily. (Patient not taking: Reported on 08/18/2015) 60 g 0   No facility-administered medications prior to visit.    ROS Review of Systems  Constitutional: Positive for fatigue. Negative for chills, activity change, appetite  change and unexpected weight change.  HENT: Negative for congestion, mouth sores and sinus pressure.   Eyes: Negative for visual disturbance.  Respiratory: Negative for cough and chest tightness.   Gastrointestinal: Negative for nausea and abdominal pain.  Genitourinary: Negative for frequency, difficulty urinating and vaginal pain.  Musculoskeletal: Negative for back pain and gait problem.  Skin: Negative for pallor and rash.  Neurological: Negative for dizziness, tremors, weakness, numbness and headaches.  Psychiatric/Behavioral: Positive for sleep disturbance and decreased concentration. Negative for suicidal ideas and confusion. The patient is nervous/anxious.     Objective:  BP 130/82 mmHg  Pulse 84  Wt 152 lb (68.947 kg)  SpO2 97%  BP Readings from Last 3 Encounters:  08/18/15 130/82  04/21/15 142/82  03/25/15 118/68    Wt Readings from Last 3 Encounters:  08/18/15 152 lb (68.947 kg)  04/21/15 157 lb (71.215 kg)  03/25/15 158 lb (71.668 kg)    Physical Exam  Constitutional: She appears well-developed. No distress.  HENT:  Head: Normocephalic.  Right Ear: External ear normal.  Left Ear: External ear normal.  Nose: Nose normal.  Mouth/Throat: Oropharynx is clear and moist.  Eyes: Conjunctivae are normal. Pupils are equal, round, and reactive to light. Right eye exhibits no discharge. Left eye exhibits no discharge.  Neck: Normal range of motion. Neck supple. No JVD present. No tracheal deviation present. No thyromegaly present.  Cardiovascular: Normal rate, regular rhythm and normal heart sounds.   Pulmonary/Chest: No  stridor. No respiratory distress. She has no wheezes.  Abdominal: Soft. Bowel sounds are normal. She exhibits no distension and no mass. There is no tenderness. There is no rebound and no guarding.  Musculoskeletal: She exhibits no edema or tenderness.  Lymphadenopathy:    She has no cervical adenopathy.  Neurological: She displays normal reflexes. No  cranial nerve deficit. She exhibits normal muscle tone. Coordination normal.  Skin: No rash noted. No erythema.  Psychiatric: She has a normal mood and affect. Her behavior is normal. Judgment and thought content normal.  sad  Lab Results  Component Value Date   WBC 6.5 06/28/2011   HGB 16.0* 06/28/2011   HCT 47.6* 06/28/2011   PLT 187.0 06/28/2011   GLUCOSE 112* 05/01/2015   CHOL 199 05/01/2015   TRIG 92.0 05/01/2015   HDL 42.00 05/01/2015   LDLDIRECT 162.8 06/28/2011   LDLCALC 139* 05/01/2015   ALT 34 05/01/2015   AST 31 05/01/2015   NA 140 05/01/2015   K 4.1 05/01/2015   CL 102 05/01/2015   CREATININE 0.69 05/01/2015   BUN 11 05/01/2015   CO2 30 05/01/2015   TSH 1.53 06/28/2011   HGBA1C 5.6 01/02/2008    US Abdomen Complete  05/01/2015  CLINICAL DATA:  Epigastric/chest pain with nausea. EXAM: ULTRASOUND ABDOMEN COMPLETE COMPARISON:  None. FINDINGS: Gallbladder: No gallstones or wall thickening visualized. No sonographic Murphy sign noted. Common bile duct: Diameter: 3 mm.  Normal caliber. Liver: Liver parenchyma is diffusely moderately to markedly echogenic with prominent posterior acoustic attenuation, in keeping with moderate to severe diffuse hepatic steatosis. There are areas of subcapsular focal fatty sparing within the liver adjacent to the gallbladder fossa. No liver mass is detected, noting significantly decreased sensitivity in the setting of moderate to severe steatosis. The liver surface appears smooth. IVC: No abnormality visualized. Pancreas: Visualized portion unremarkable. Spleen: Size and appearance within normal limits. A circumscribed 2.1 x 2.4 x 2.5 cm round homogeneous solid mass adjacent to the spleen is isoechoic to the spleen, and is in keeping with a splenule. Right Kidney: Length: 10.6 cm. Echogenicity within normal limits. No mass or hydronephrosis visualized. Left Kidney: Length: 11.6 cm. Echogenicity within normal limits. No mass or hydronephrosis  visualized. Abdominal aorta: No aneurysm visualized. Other findings: None. IMPRESSION: 1. Moderate to severe diffuse hepatic steatosis. 2. Otherwise unremarkable abdominal sonogram, with no cholelithiasis. Electronically Signed   By: Ilona Sorrel M.D.   On: 05/01/2015 11:20    Assessment & Plan:   Diagnoses and all orders for this visit:  Encounter for immunization  Need for prophylactic vaccination with combined diphtheria-tetanus-pertussis (DTP) vaccine -     Tdap vaccine greater than or equal to 7yo IM  Other orders -     venlafaxine XR (EFFEXOR XR) 37.5 MG 24 hr capsule; Take 1 capsule (37.5 mg total) by mouth daily with breakfast. -     Fluticasone-Salmeterol (ADVAIR DISKUS) 250-50 MCG/DOSE AEPB; Inhale 1 puff into the lungs every 12 (twelve) hours. -     Flu vaccine HIGH DOSE PF   I have discontinued Ms. Branson's escitalopram. I am also having her start on venlafaxine XR. Additionally, I am having her maintain her tiotropium, fluticasone, Diethylpropion HCl, esomeprazole, ALPRAZolam, hydrochlorothiazide, triamcinolone ointment, albuterol, lipase/protease/amylase, temazepam, aspirin, clobetasol cream, and Fluticasone-Salmeterol.  Meds ordered this encounter  Medications  . clobetasol cream (TEMOVATE) 0.05 %    Sig: APPLY TO AFFECTED AREA BID    Refill:  3  . venlafaxine XR (EFFEXOR XR) 37.5 MG 24  hr capsule    Sig: Take 1 capsule (37.5 mg total) by mouth daily with breakfast.    Dispense:  30 capsule    Refill:  6  . Fluticasone-Salmeterol (ADVAIR DISKUS) 250-50 MCG/DOSE AEPB    Sig: Inhale 1 puff into the lungs every 12 (twelve) hours.    Dispense:  60 each    Refill:  11     Follow-up: Return in about 6 weeks (around 09/29/2015) for a follow-up visit.  Walker Kehr, MD

## 2015-08-20 NOTE — Assessment & Plan Note (Signed)
Discussed.

## 2015-08-20 NOTE — Assessment & Plan Note (Signed)
Start Effexor

## 2015-08-20 NOTE — Assessment & Plan Note (Signed)
Smoking discussed Albuterol prn 

## 2015-08-20 NOTE — Assessment & Plan Note (Signed)
BP Readings from Last 3 Encounters:  08/18/15 130/82  04/21/15 142/82  03/25/15 118/68

## 2015-09-29 ENCOUNTER — Other Ambulatory Visit (INDEPENDENT_AMBULATORY_CARE_PROVIDER_SITE_OTHER): Payer: Medicare Other

## 2015-09-29 ENCOUNTER — Encounter: Payer: Self-pay | Admitting: Internal Medicine

## 2015-09-29 ENCOUNTER — Ambulatory Visit (INDEPENDENT_AMBULATORY_CARE_PROVIDER_SITE_OTHER): Payer: Medicare Other | Admitting: Internal Medicine

## 2015-09-29 VITALS — BP 140/90 | HR 81 | Wt 157.0 lb

## 2015-09-29 DIAGNOSIS — J449 Chronic obstructive pulmonary disease, unspecified: Secondary | ICD-10-CM | POA: Diagnosis not present

## 2015-09-29 DIAGNOSIS — E785 Hyperlipidemia, unspecified: Secondary | ICD-10-CM | POA: Diagnosis not present

## 2015-09-29 DIAGNOSIS — F4323 Adjustment disorder with mixed anxiety and depressed mood: Secondary | ICD-10-CM

## 2015-09-29 DIAGNOSIS — I6523 Occlusion and stenosis of bilateral carotid arteries: Secondary | ICD-10-CM

## 2015-09-29 DIAGNOSIS — F331 Major depressive disorder, recurrent, moderate: Secondary | ICD-10-CM | POA: Diagnosis not present

## 2015-09-29 LAB — BASIC METABOLIC PANEL
BUN: 10 mg/dL (ref 6–23)
CHLORIDE: 105 meq/L (ref 96–112)
CO2: 32 mEq/L (ref 19–32)
CREATININE: 0.64 mg/dL (ref 0.40–1.20)
Calcium: 9.5 mg/dL (ref 8.4–10.5)
GFR: 97.47 mL/min (ref >60.00)
GLUCOSE: 87 mg/dL (ref 70–99)
POTASSIUM: 3.5 meq/L (ref 3.5–5.1)
Sodium: 142 mEq/L (ref 135–145)

## 2015-09-29 LAB — CBC WITH DIFFERENTIAL/PLATELET
BASOS PCT: 0.5 % (ref 0.0–3.0)
Basophils Absolute: 0 10*3/uL (ref 0.0–0.1)
Eosinophils Absolute: 0.3 10*3/uL (ref 0.0–0.7)
Eosinophils Relative: 4.6 % (ref 0.0–5.0)
HEMATOCRIT: 46.9 % — AB (ref 36.0–46.0)
HEMOGLOBIN: 15.5 g/dL — AB (ref 12.0–15.0)
LYMPHS PCT: 33.6 % (ref 12.0–46.0)
Lymphs Abs: 2.4 10*3/uL (ref 0.7–4.0)
MCHC: 33 g/dL (ref 30.0–36.0)
MCV: 86 fl (ref 78.0–100.0)
MONOS PCT: 8.6 % (ref 3.0–12.0)
Monocytes Absolute: 0.6 10*3/uL (ref 0.1–1.0)
NEUTROS ABS: 3.8 10*3/uL (ref 1.4–7.7)
Neutrophils Relative %: 52.7 % (ref 43.0–77.0)
PLATELETS: 176 10*3/uL (ref 150.0–400.0)
RBC: 5.45 Mil/uL — ABNORMAL HIGH (ref 3.87–5.11)
RDW: 14.1 % (ref 11.5–15.5)
WBC: 7.2 10*3/uL (ref 4.0–10.5)

## 2015-09-29 LAB — TSH: TSH: 1.52 u[IU]/mL (ref 0.35–4.50)

## 2015-09-29 MED ORDER — TEMAZEPAM 15 MG PO CAPS: ORAL_CAPSULE | ORAL | Status: DC

## 2015-09-29 MED ORDER — PAROXETINE HCL 10 MG PO TABS: 10.0000 mg | ORAL_TABLET | Freq: Every day | ORAL | Status: DC

## 2015-09-29 NOTE — Assessment & Plan Note (Signed)
  On diet  

## 2015-09-29 NOTE — Progress Notes (Signed)
Subjective:  Patient ID: Theresa Aguirre, female    DOB: Aug 15, 1946  Age: 69 y.o. MRN: [id number]  CC: No chief complaint on file.   HPI Theresa Aguirre presents for depression, anxiety, PVD f/u. Pt would like to try Paxil - c/o depression.  Outpatient Prescriptions Prior to Visit  Medication Sig Dispense Refill  . albuterol (PROVENTIL HFA;VENTOLIN HFA) 108 (90 BASE) MCG/ACT inhaler Inhale 2 puffs into the lungs every 6 (six) hours as needed. 1 Inhaler 11  . ALPRAZolam (XANAX) 1 MG tablet TAKE 1 TABLET BY MOUTH FOUR TIMES DAILY AS NEEDED FOR SLEEP OR ANXIETY 360 tablet 1  . aspirin (BAYER ASPIRIN) 325 MG tablet Take 0.5 tablets (162 mg total) by mouth daily. 100 tablet 3  . clobetasol cream (TEMOVATE) 0.05 % APPLY TO AFFECTED AREA BID  3  . Diethylpropion HCl 25 MG TABS 1 po tid 90 tablet 1  . esomeprazole (NEXIUM) 40 MG capsule Take 1 capsule (40 mg total) by mouth daily. 30 capsule 5  . fluticasone (FLONASE) 50 MCG/ACT nasal spray Place 2 sprays into both nostrils daily. 16 g 6  . Fluticasone-Salmeterol (ADVAIR DISKUS) 250-50 MCG/DOSE AEPB Inhale 1 puff into the lungs every 12 (twelve) hours. 60 each 11  . hydrochlorothiazide (MICROZIDE) 12.5 MG capsule Take 1 capsule (12.5 mg total) by mouth daily. 30 capsule 11  . lipase/protease/amylase (CREON) 36000 UNITS CPEP capsule Take 1 capsule (36,000 Units total) by mouth 3 (three) times daily before meals. 90 capsule 11  . temazepam (RESTORIL) 15 MG capsule TAKE 1-2 CAPSULES BY MOUTH EVERY NIGHT AT BEDTIME AS NEEDED FOR INSOMNIA 180 capsule 1  . tiotropium (SPIRIVA) 18 MCG inhalation capsule Place 18 mcg into inhaler and inhale daily.      Marland Kitchen triamcinolone ointment (KENALOG) 0.5 % Apply 1 application topically 3 (three) times daily. 60 g 0  . venlafaxine XR (EFFEXOR XR) 37.5 MG 24 hr capsule Take 1 capsule (37.5 mg total) by mouth daily with breakfast. 30 capsule 6   No facility-administered medications prior to visit.    ROS Review of  Systems  Constitutional: Negative for chills, activity change, appetite change, fatigue and unexpected weight change.  HENT: Negative for congestion, mouth sores and sinus pressure.   Eyes: Negative for visual disturbance.  Respiratory: Negative for cough and chest tightness.   Gastrointestinal: Negative for nausea and abdominal pain.  Genitourinary: Negative for frequency, difficulty urinating and vaginal pain.  Musculoskeletal: Negative for back pain and gait problem.  Skin: Negative for pallor and rash.  Neurological: Negative for dizziness, tremors, weakness, numbness and headaches.  Psychiatric/Behavioral: Positive for sleep disturbance and dysphoric mood. Negative for suicidal ideas, behavioral problems and confusion. The patient is nervous/anxious.     Objective:  BP 140/90 mmHg  Pulse 81  Wt 157 lb (71.215 kg)  SpO2 97%  BP Readings from Last 3 Encounters:  09/29/15 140/90  08/18/15 130/82  04/21/15 142/82    Wt Readings from Last 3 Encounters:  09/29/15 157 lb (71.215 kg)  08/18/15 152 lb (68.947 kg)  04/21/15 157 lb (71.215 kg)    Physical Exam  Constitutional: She appears well-developed. No distress.  HENT:  Head: Normocephalic.  Right Ear: External ear normal.  Left Ear: External ear normal.  Nose: Nose normal.  Mouth/Throat: Oropharynx is clear and moist.  Eyes: Conjunctivae are normal. Pupils are equal, round, and reactive to light. Right eye exhibits no discharge. Left eye exhibits no discharge.  Neck: Normal range of motion. Neck supple. No JVD  present. No tracheal deviation present. No thyromegaly present.  Cardiovascular: Normal rate, regular rhythm and normal heart sounds.   Pulmonary/Chest: No stridor. No respiratory distress. She has no wheezes.  Abdominal: Soft. Bowel sounds are normal. She exhibits no distension and no mass. There is no tenderness. There is no rebound and no guarding.  Musculoskeletal: She exhibits no edema or tenderness.    Lymphadenopathy:    She has no cervical adenopathy.  Neurological: She displays normal reflexes. No cranial nerve deficit. She exhibits normal muscle tone. Coordination normal.  Skin: No rash noted. No erythema.  Psychiatric: She has a normal mood and affect. Her behavior is normal. Judgment and thought content normal.    Lab Results  Component Value Date   WBC 6.5 06/28/2011   HGB 16.0* 06/28/2011   HCT 47.6* 06/28/2011   PLT 187.0 06/28/2011   GLUCOSE 112* 05/01/2015   CHOL 199 05/01/2015   TRIG 92.0 05/01/2015   HDL 42.00 05/01/2015   LDLDIRECT 162.8 06/28/2011   LDLCALC 139* 05/01/2015   ALT 34 05/01/2015   AST 31 05/01/2015   NA 140 05/01/2015   K 4.1 05/01/2015   CL 102 05/01/2015   CREATININE 0.69 05/01/2015   BUN 11 05/01/2015   CO2 30 05/01/2015   TSH 1.53 06/28/2011   HGBA1C 5.6 01/02/2008    US Abdomen Complete  05/01/2015  CLINICAL DATA:  Epigastric/chest pain with nausea. EXAM: ULTRASOUND ABDOMEN COMPLETE COMPARISON:  None. FINDINGS: Gallbladder: No gallstones or wall thickening visualized. No sonographic Murphy sign noted. Common bile duct: Diameter: 3 mm.  Normal caliber. Liver: Liver parenchyma is diffusely moderately to markedly echogenic with prominent posterior acoustic attenuation, in keeping with moderate to severe diffuse hepatic steatosis. There are areas of subcapsular focal fatty sparing within the liver adjacent to the gallbladder fossa. No liver mass is detected, noting significantly decreased sensitivity in the setting of moderate to severe steatosis. The liver surface appears smooth. IVC: No abnormality visualized. Pancreas: Visualized portion unremarkable. Spleen: Size and appearance within normal limits. A circumscribed 2.1 x 2.4 x 2.5 cm round homogeneous solid mass adjacent to the spleen is isoechoic to the spleen, and is in keeping with a splenule. Right Kidney: Length: 10.6 cm. Echogenicity within normal limits. No mass or hydronephrosis  visualized. Left Kidney: Length: 11.6 cm. Echogenicity within normal limits. No mass or hydronephrosis visualized. Abdominal aorta: No aneurysm visualized. Other findings: None. IMPRESSION: 1. Moderate to severe diffuse hepatic steatosis. 2. Otherwise unremarkable abdominal sonogram, with no cholelithiasis. Electronically Signed   By: Ilona Sorrel M.D.   On: 05/01/2015 11:20    Assessment & Plan:   There are no diagnoses linked to this encounter. I have discontinued Ms. Hulet's venlafaxine XR. I am also having her start on PARoxetine. Additionally, I am having her maintain her tiotropium, fluticasone, Diethylpropion HCl, esomeprazole, ALPRAZolam, hydrochlorothiazide, triamcinolone ointment, albuterol, lipase/protease/amylase, temazepam, aspirin, clobetasol cream, and Fluticasone-Salmeterol.  Meds ordered this encounter  Medications  . PARoxetine (PAXIL) 10 MG tablet    Sig: Take 1 tablet (10 mg total) by mouth at bedtime.    Dispense:  30 tablet    Refill:  5     Follow-up: Return in about 3 months (around 12/28/2015) for a follow-up visit.  Walker Kehr, MD

## 2015-09-29 NOTE — Assessment & Plan Note (Signed)
Start Paxil 

## 2015-09-29 NOTE — Assessment & Plan Note (Signed)
Try Paxil 

## 2015-09-29 NOTE — Assessment & Plan Note (Signed)
Smoking discussed 

## 2015-09-29 NOTE — Assessment & Plan Note (Signed)
On ASA Statin intolerance

## 2015-09-29 NOTE — Progress Notes (Signed)
Pre visit review using our clinic review tool, if applicable. No additional management support is needed unless otherwise documented below in the visit note. 

## 2015-10-30 ENCOUNTER — Other Ambulatory Visit: Payer: Self-pay | Admitting: Internal Medicine

## 2015-11-02 ENCOUNTER — Other Ambulatory Visit: Payer: Self-pay | Admitting: Internal Medicine

## 2015-11-04 NOTE — Telephone Encounter (Signed)
Called in to [facility]

## 2015-11-20 ENCOUNTER — Other Ambulatory Visit: Payer: Self-pay | Admitting: Internal Medicine

## 2015-12-30 ENCOUNTER — Other Ambulatory Visit: Payer: Self-pay

## 2015-12-30 DIAGNOSIS — Z1231 Encounter for screening mammogram for malignant neoplasm of breast: Secondary | ICD-10-CM

## 2016-01-12 ENCOUNTER — Ambulatory Visit: Payer: Medicare Other

## 2016-01-15 ENCOUNTER — Ambulatory Visit: Payer: Medicare Other | Admitting: Internal Medicine

## 2016-01-27 ENCOUNTER — Encounter: Payer: Self-pay | Admitting: Internal Medicine

## 2016-01-27 ENCOUNTER — Ambulatory Visit (INDEPENDENT_AMBULATORY_CARE_PROVIDER_SITE_OTHER): Payer: Medicare Other | Admitting: Internal Medicine

## 2016-01-27 VITALS — BP 130/88 | HR 82 | Wt 157.0 lb

## 2016-01-27 DIAGNOSIS — F331 Major depressive disorder, recurrent, moderate: Secondary | ICD-10-CM

## 2016-01-27 DIAGNOSIS — R030 Elevated blood-pressure reading, without diagnosis of hypertension: Secondary | ICD-10-CM | POA: Diagnosis not present

## 2016-01-27 DIAGNOSIS — R0789 Other chest pain: Secondary | ICD-10-CM

## 2016-01-27 DIAGNOSIS — J449 Chronic obstructive pulmonary disease, unspecified: Secondary | ICD-10-CM

## 2016-01-27 DIAGNOSIS — IMO0001 Reserved for inherently not codable concepts without codable children: Secondary | ICD-10-CM

## 2016-01-27 MED ORDER — TEMAZEPAM 15 MG PO CAPS: 15.0000 mg | ORAL_CAPSULE | Freq: Every evening | ORAL | Status: DC | PRN

## 2016-01-27 MED ORDER — ALPRAZOLAM 1 MG PO TABS: ORAL_TABLET | ORAL | Status: DC

## 2016-01-27 NOTE — Progress Notes (Signed)
Subjective:  Patient ID: Theresa Aguirre, female    DOB: Jun 13, 1946  Age: 70 y.o. MRN: [id number]  CC: No chief complaint on file.   HPI Mallie Barish presents for COPD, anxiety, insomnia, OA f/u C/o L CP w/ROM x 2 week  Outpatient Prescriptions Prior to Visit  Medication Sig Dispense Refill  . ADVAIR DISKUS 250-50 MCG/DOSE AEPB INHALE 1 PUFF INTO THE LUNGS EVERY 12 HOURS 60 each 2  . albuterol (PROVENTIL HFA;VENTOLIN HFA) 108 (90 BASE) MCG/ACT inhaler Inhale 2 puffs into the lungs every 6 (six) hours as needed. 1 Inhaler 11  . aspirin (BAYER ASPIRIN) 325 MG tablet Take 0.5 tablets (162 mg total) by mouth daily. 100 tablet 3  . clobetasol cream (TEMOVATE) 0.05 % APPLY TO AFFECTED AREA BID  3  . Diethylpropion HCl 25 MG TABS 1 po tid 90 tablet 1  . esomeprazole (NEXIUM) 40 MG capsule Take 1 capsule (40 mg total) by mouth daily. 30 capsule 5  . fluticasone (FLONASE) 50 MCG/ACT nasal spray Place 2 sprays into both nostrils daily. 16 g 6  . hydrochlorothiazide (MICROZIDE) 12.5 MG capsule TAKE ONE CAPSULE BY MOUTH DAILY 30 capsule 3  . lipase/protease/amylase (CREON) 36000 UNITS CPEP capsule Take 1 capsule (36,000 Units total) by mouth 3 (three) times daily before meals. 90 capsule 11  . PARoxetine (PAXIL) 10 MG tablet Take 1 tablet (10 mg total) by mouth at bedtime. 30 tablet 5  . tiotropium (SPIRIVA) 18 MCG inhalation capsule Place 18 mcg into inhaler and inhale daily.      Marland Kitchen triamcinolone ointment (KENALOG) 0.5 % Apply 1 application topically 3 (three) times daily. 60 g 0  . ALPRAZolam (XANAX) 1 MG tablet TAKE 1 TABLET BY MOUTH FOUR TIMES DAILY AS NEEDED FOR SLEEP OR ANXIETY 360 tablet 1  . temazepam (RESTORIL) 15 MG capsule TAKE 1 TO 2 CAPSULES BY MOUTH EVERY NIGHT AT BEDTIME AS NEEDED FOR INSOMNIA 180 capsule 0   No facility-administered medications prior to visit.    ROS Review of Systems  Constitutional: Negative for chills, activity change, appetite change, fatigue and unexpected  weight change.  HENT: Negative for congestion, mouth sores and sinus pressure.   Eyes: Negative for visual disturbance.  Respiratory: Negative for cough and chest tightness.   Gastrointestinal: Negative for nausea and abdominal pain.  Genitourinary: Negative for frequency, difficulty urinating and vaginal pain.  Musculoskeletal: Negative for back pain and gait problem.  Skin: Negative for pallor and rash.  Neurological: Negative for dizziness, tremors, weakness, numbness and headaches.  Psychiatric/Behavioral: Positive for sleep disturbance. Negative for suicidal ideas, behavioral problems and confusion. The patient is nervous/anxious.     Objective:  BP 130/88 mmHg  Pulse 82  Wt 157 lb (71.215 kg)  SpO2 96%  BP Readings from Last 3 Encounters:  01/27/16 130/88  09/29/15 140/90  08/18/15 130/82    Wt Readings from Last 3 Encounters:  01/27/16 157 lb (71.215 kg)  09/29/15 157 lb (71.215 kg)  08/18/15 152 lb (68.947 kg)    Physical Exam  Constitutional: She appears well-developed. No distress.  HENT:  Head: Normocephalic.  Right Ear: External ear normal.  Left Ear: External ear normal.  Nose: Nose normal.  Mouth/Throat: Oropharynx is clear and moist.  Eyes: Conjunctivae are normal. Pupils are equal, round, and reactive to light. Right eye exhibits no discharge. Left eye exhibits no discharge.  Neck: Normal range of motion. Neck supple. No JVD present. No tracheal deviation present. No thyromegaly present.  Cardiovascular: Normal rate,  regular rhythm and normal heart sounds.   Pulmonary/Chest: No stridor. No respiratory distress. She has no wheezes.  Abdominal: Soft. Bowel sounds are normal. She exhibits no distension and no mass. There is no tenderness. There is no rebound and no guarding.  Musculoskeletal: She exhibits no edema or tenderness.  Lymphadenopathy:    She has no cervical adenopathy.  Neurological: She displays normal reflexes. No cranial nerve deficit. She  exhibits normal muscle tone. Coordination normal.  Skin: No rash noted. No erythema.  Psychiatric: She has a normal mood and affect. Her behavior is normal. Judgment and thought content normal.   Grieving  Lab Results  Component Value Date   WBC 7.2 09/29/2015   HGB 15.5* 09/29/2015   HCT 46.9* 09/29/2015   PLT 176.0 09/29/2015   GLUCOSE 87 09/29/2015   CHOL 199 05/01/2015   TRIG 92.0 05/01/2015   HDL 42.00 05/01/2015   LDLDIRECT 162.8 06/28/2011   LDLCALC 139* 05/01/2015   ALT 34 05/01/2015   AST 31 05/01/2015   NA 142 09/29/2015   K 3.5 09/29/2015   CL 105 09/29/2015   CREATININE 0.64 09/29/2015   BUN 10 09/29/2015   CO2 32 09/29/2015   TSH 1.52 09/29/2015   HGBA1C 5.6 01/02/2008    US Abdomen Complete  05/01/2015  CLINICAL DATA:  Epigastric/chest pain with nausea. EXAM: ULTRASOUND ABDOMEN COMPLETE COMPARISON:  None. FINDINGS: Gallbladder: No gallstones or wall thickening visualized. No sonographic Murphy sign noted. Common bile duct: Diameter: 3 mm.  Normal caliber. Liver: Liver parenchyma is diffusely moderately to markedly echogenic with prominent posterior acoustic attenuation, in keeping with moderate to severe diffuse hepatic steatosis. There are areas of subcapsular focal fatty sparing within the liver adjacent to the gallbladder fossa. No liver mass is detected, noting significantly decreased sensitivity in the setting of moderate to severe steatosis. The liver surface appears smooth. IVC: No abnormality visualized. Pancreas: Visualized portion unremarkable. Spleen: Size and appearance within normal limits. A circumscribed 2.1 x 2.4 x 2.5 cm round homogeneous solid mass adjacent to the spleen is isoechoic to the spleen, and is in keeping with a splenule. Right Kidney: Length: 10.6 cm. Echogenicity within normal limits. No mass or hydronephrosis visualized. Left Kidney: Length: 11.6 cm. Echogenicity within normal limits. No mass or hydronephrosis visualized. Abdominal aorta:  No aneurysm visualized. Other findings: None. IMPRESSION: 1. Moderate to severe diffuse hepatic steatosis. 2. Otherwise unremarkable abdominal sonogram, with no cholelithiasis. Electronically Signed   By: Ilona Sorrel M.D.   On: 05/01/2015 11:20    Assessment & Plan:   There are no diagnoses linked to this encounter. I have changed Ms. Patton's temazepam. I am also having her maintain her tiotropium, fluticasone, Diethylpropion HCl, esomeprazole, triamcinolone ointment, albuterol, lipase/protease/amylase, aspirin, clobetasol cream, PARoxetine, ADVAIR DISKUS, hydrochlorothiazide, and ALPRAZolam.  Meds ordered this encounter  Medications  . ALPRAZolam (XANAX) 1 MG tablet    Sig: TAKE 1 TABLET BY MOUTH FOUR TIMES DAILY AS NEEDED FOR SLEEP OR ANXIETY    Dispense:  360 tablet    Refill:  1  . temazepam (RESTORIL) 15 MG capsule    Sig: Take 1-2 capsules (15-30 mg total) by mouth at bedtime as needed for sleep.    Dispense:  180 capsule    Refill:  1     Follow-up: No Follow-up on file.  Walker Kehr, MD

## 2016-01-27 NOTE — Assessment & Plan Note (Signed)
CXR

## 2016-01-27 NOTE — Progress Notes (Signed)
Pre visit review using our clinic review tool, if applicable. No additional management support is needed unless otherwise documented below in the visit note. 

## 2016-02-07 NOTE — Assessment & Plan Note (Signed)
BP Readings from Last 3 Encounters:  01/27/16 130/88  09/29/15 140/90  08/18/15 130/82

## 2016-02-07 NOTE — Assessment & Plan Note (Signed)
On Paxil 

## 2016-02-07 NOTE — Assessment & Plan Note (Signed)
Chronic sx's Advair Proventil

## 2016-05-23 ENCOUNTER — Other Ambulatory Visit: Payer: Self-pay | Admitting: Internal Medicine

## 2016-05-25 ENCOUNTER — Telehealth: Payer: Self-pay | Admitting: Internal Medicine

## 2016-05-25 ENCOUNTER — Telehealth: Payer: Self-pay | Admitting: *Deleted

## 2016-05-25 MED ORDER — PREDNISONE 10 MG PO TABS: ORAL_TABLET | ORAL | 1 refills | Status: DC

## 2016-05-25 MED ORDER — PREDNISOLONE 5 MG PO TABS: 10.0000 mg | ORAL_TABLET | Freq: Every day | ORAL | 3 refills | Status: DC

## 2016-05-25 NOTE — Telephone Encounter (Signed)
I changed Rx to [facility] - emailed Rx Thx

## 2016-05-25 NOTE — Telephone Encounter (Signed)
Pharmacy left msg on triage stating rec'd script for the Prednisolone, but pt insurance plan does not cover. Med will cost pt $368. Wanting to know if its ok to change to Predisone instead.Marland KitchenJohny Chess

## 2016-05-25 NOTE — Telephone Encounter (Signed)
Needs Prednisone ref - OK - done Thx

## 2016-06-07 ENCOUNTER — Ambulatory Visit: Payer: Medicare Other | Admitting: Internal Medicine

## 2016-06-08 ENCOUNTER — Other Ambulatory Visit: Payer: Self-pay | Admitting: *Deleted

## 2016-06-08 MED ORDER — ALBUTEROL SULFATE HFA 108 (90 BASE) MCG/ACT IN AERS: 2.0000 | INHALATION_SPRAY | Freq: Four times a day (QID) | RESPIRATORY_TRACT | 3 refills | Status: DC | PRN

## 2016-06-22 ENCOUNTER — Inpatient Hospital Stay: Admission: RE | Admit: 2016-06-22 | Payer: Self-pay | Source: Ambulatory Visit

## 2016-06-22 ENCOUNTER — Encounter: Payer: Self-pay | Admitting: Internal Medicine

## 2016-06-22 ENCOUNTER — Ambulatory Visit (INDEPENDENT_AMBULATORY_CARE_PROVIDER_SITE_OTHER)
Admission: RE | Admit: 2016-06-22 | Discharge: 2016-06-22 | Disposition: A | Discharge status: Home / Self Care | Payer: Medicare Other | Source: Ambulatory Visit | Attending: Internal Medicine | Admitting: Internal Medicine

## 2016-06-22 ENCOUNTER — Other Ambulatory Visit (INDEPENDENT_AMBULATORY_CARE_PROVIDER_SITE_OTHER): Payer: Medicare Other

## 2016-06-22 ENCOUNTER — Ambulatory Visit (INDEPENDENT_AMBULATORY_CARE_PROVIDER_SITE_OTHER): Payer: Medicare Other | Admitting: Internal Medicine

## 2016-06-22 DIAGNOSIS — R0609 Other forms of dyspnea: Secondary | ICD-10-CM | POA: Insufficient documentation

## 2016-06-22 DIAGNOSIS — R0789 Other chest pain: Secondary | ICD-10-CM

## 2016-06-22 DIAGNOSIS — R0600 Dyspnea, unspecified: Secondary | ICD-10-CM | POA: Diagnosis not present

## 2016-06-22 DIAGNOSIS — R0602 Shortness of breath: Secondary | ICD-10-CM

## 2016-06-22 DIAGNOSIS — J449 Chronic obstructive pulmonary disease, unspecified: Secondary | ICD-10-CM

## 2016-06-22 DIAGNOSIS — R600 Localized edema: Secondary | ICD-10-CM

## 2016-06-22 LAB — CREATININE KINASE MB: CK-MB: 3.5 ng/mL (ref 0.3–4.0)

## 2016-06-22 MED ORDER — FLUTICASONE FUROATE-VILANTEROL 100-25 MCG/INH IN AEPB: 1.0000 | INHALATION_SPRAY | Freq: Every day | RESPIRATORY_TRACT | 11 refills | Status: DC

## 2016-06-22 NOTE — Progress Notes (Signed)
Subjective:  Patient ID: Theresa Aguirre, female    DOB: December 07, 1945  Age: 70 y.o. MRN: [id number]  CC: No chief complaint on file.   HPI Gean Stefanowicz presents for SOB and anxiety, CP on the L side this am relieved by Albuterol, Xanax, Omeprazole. It lasted since 1 pm and resolved at 4:50 pm Just retired 1 wk ago - more anxious. The pt stopped Advair due to cost. Smoking 1 ppd She was in [facility] in June - legs were swollen there - came back in August by car. She forgot to do a CXR and a stress test last year There was some LLE pain and swelling, not lately...  Outpatient Medications Prior to Visit  Medication Sig Dispense Refill  . ADVAIR DISKUS 250-50 MCG/DOSE AEPB INHALE 1 PUFF INTO THE LUNGS EVERY 12 HOURS 60 each 2  . albuterol (PROVENTIL HFA;VENTOLIN HFA) 108 (90 Base) MCG/ACT inhaler Inhale 2 puffs into the lungs every 6 (six) hours as needed. 1 Inhaler 3  . ALPRAZolam (XANAX) 1 MG tablet TAKE 1 TABLET BY MOUTH FOUR TIMES DAILY AS NEEDED FOR SLEEP OR ANXIETY 360 tablet 1  . aspirin (BAYER ASPIRIN) 325 MG tablet Take 0.5 tablets (162 mg total) by mouth daily. 100 tablet 3  . clobetasol cream (TEMOVATE) 0.05 % APPLY TO AFFECTED AREA BID  3  . Diethylpropion HCl 25 MG TABS 1 po tid 90 tablet 1  . esomeprazole (NEXIUM) 40 MG capsule Take 1 capsule (40 mg total) by mouth daily. 30 capsule 5  . fluticasone (FLONASE) 50 MCG/ACT nasal spray Place 2 sprays into both nostrils daily. 16 g 6  . hydrochlorothiazide (MICROZIDE) 12.5 MG capsule TAKE ONE CAPSULE BY MOUTH DAILY 30 capsule 5  . lipase/protease/amylase (CREON) 36000 UNITS CPEP capsule Take 1 capsule (36,000 Units total) by mouth 3 (three) times daily before meals. 90 capsule 11  . PARoxetine (PAXIL) 10 MG tablet TAKE 1 TABLET BY MOUTH AT BEDTIME (Patient taking differently: TAKE 1 TABLET BY MOUTH EVERY OTHER DAY) 30 tablet 5  . predniSONE (DELTASONE) 10 MG tablet 1 po qd 90 tablet 1  . temazepam (RESTORIL) 15 MG capsule Take 1-2 capsules  (15-30 mg total) by mouth at bedtime as needed for sleep. 180 capsule 1  . tiotropium (SPIRIVA) 18 MCG inhalation capsule Place 18 mcg into inhaler and inhale daily.      Marland Kitchen triamcinolone ointment (KENALOG) 0.5 % Apply 1 application topically 3 (three) times daily. 60 g 0   No facility-administered medications prior to visit.     ROS Review of Systems  Constitutional: Negative for activity change, appetite change, chills, fatigue and unexpected weight change.  HENT: Negative for congestion, mouth sores and sinus pressure.   Eyes: Negative for visual disturbance.  Respiratory: Negative for cough and chest tightness.   Cardiovascular: Negative for leg swelling.  Gastrointestinal: Negative for abdominal pain and nausea.  Genitourinary: Negative for difficulty urinating, frequency and vaginal pain.  Musculoskeletal: Negative for back pain and gait problem.  Skin: Negative for pallor and rash.  Neurological: Negative for dizziness, tremors, weakness, numbness and headaches.  Psychiatric/Behavioral: Positive for dysphoric mood. Negative for confusion and sleep disturbance. The patient is nervous/anxious.     Objective:  BP 140/90   Pulse 87   Temp 97.5 F (36.4 C) (Oral)   Wt 157 lb (71.2 kg)   SpO2 98%   BMI 26.13 kg/m   BP Readings from Last 3 Encounters:  06/22/16 140/90  01/27/16 130/88  09/29/15 140/90  Wt Readings from Last 3 Encounters:  06/22/16 157 lb (71.2 kg)  01/27/16 157 lb (71.2 kg)  09/29/15 157 lb (71.2 kg)    Physical Exam  Constitutional: She appears well-developed. No distress.  HENT:  Head: Normocephalic.  Right Ear: External ear normal.  Left Ear: External ear normal.  Nose: Nose normal.  Mouth/Throat: Oropharynx is clear and moist.  Eyes: Conjunctivae are normal. Pupils are equal, round, and reactive to light. Right eye exhibits no discharge. Left eye exhibits no discharge.  Neck: Normal range of motion. Neck supple. No JVD present. No tracheal  deviation present. No thyromegaly present.  Cardiovascular: Normal rate, regular rhythm and normal heart sounds.   Pulmonary/Chest: No stridor. No respiratory distress. She has no wheezes.  Abdominal: Soft. Bowel sounds are normal. She exhibits no distension and no mass. There is no tenderness. There is no rebound and no guarding.  Musculoskeletal: She exhibits no edema or tenderness.  Lymphadenopathy:    She has no cervical adenopathy.  Neurological: She displays normal reflexes. No cranial nerve deficit. She exhibits normal muscle tone. Coordination normal.  Skin: No rash noted. No erythema.  Psychiatric: She has a normal mood and affect. Her behavior is normal. Judgment and thought content normal.   Procedure: EKG Indication: chest pain Impression: NSR. No acute changes.  Lab Results  Component Value Date   WBC 7.2 09/29/2015   HGB 15.5 (H) 09/29/2015   HCT 46.9 (H) 09/29/2015   PLT 176.0 09/29/2015   GLUCOSE 87 09/29/2015   CHOL 199 05/01/2015   TRIG 92.0 05/01/2015   HDL 42.00 05/01/2015   LDLDIRECT 162.8 06/28/2011   LDLCALC 139 (H) 05/01/2015   ALT 34 05/01/2015   AST 31 05/01/2015   NA 142 09/29/2015   K 3.5 09/29/2015   CL 105 09/29/2015   CREATININE 0.64 09/29/2015   BUN 10 09/29/2015   CO2 32 09/29/2015   TSH 1.52 09/29/2015   HGBA1C 5.6 01/02/2008    US Abdomen Complete  Result Date: 05/01/2015 CLINICAL DATA:  Epigastric/chest pain with nausea. EXAM: ULTRASOUND ABDOMEN COMPLETE COMPARISON:  None. FINDINGS: Gallbladder: No gallstones or wall thickening visualized. No sonographic Murphy sign noted. Common bile duct: Diameter: 3 mm.  Normal caliber. Liver: Liver parenchyma is diffusely moderately to markedly echogenic with prominent posterior acoustic attenuation, in keeping with moderate to severe diffuse hepatic steatosis. There are areas of subcapsular focal fatty sparing within the liver adjacent to the gallbladder fossa. No liver mass is detected, noting  significantly decreased sensitivity in the setting of moderate to severe steatosis. The liver surface appears smooth. IVC: No abnormality visualized. Pancreas: Visualized portion unremarkable. Spleen: Size and appearance within normal limits. A circumscribed 2.1 x 2.4 x 2.5 cm round homogeneous solid mass adjacent to the spleen is isoechoic to the spleen, and is in keeping with a splenule. Right Kidney: Length: 10.6 cm. Echogenicity within normal limits. No mass or hydronephrosis visualized. Left Kidney: Length: 11.6 cm. Echogenicity within normal limits. No mass or hydronephrosis visualized. Abdominal aorta: No aneurysm visualized. Other findings: None. IMPRESSION: 1. Moderate to severe diffuse hepatic steatosis. 2. Otherwise unremarkable abdominal sonogram, with no cholelithiasis. Electronically Signed   By: Ilona Sorrel M.D.   On: 05/01/2015 11:20    Assessment & Plan:   Diagnoses and all orders for this visit:  SOB (shortness of breath) -     EKG 12-Lead   I am having Ms. Glogowski maintain her tiotropium, fluticasone, Diethylpropion HCl, esomeprazole, triamcinolone ointment, lipase/protease/amylase, aspirin, clobetasol cream, ADVAIR  DISKUS, ALPRAZolam, temazepam, hydrochlorothiazide, PARoxetine, predniSONE, and albuterol.  No orders of the defined types were placed in this encounter.    Follow-up: No Follow-up on file.  Walker Kehr, MD

## 2016-06-22 NOTE — Assessment & Plan Note (Signed)
Resolved D-dimer

## 2016-06-22 NOTE — Assessment & Plan Note (Addendum)
acute on chronic R/o COPD, panic attack, angina Call 911 if DOE/CP relapsed CL stress test Labs CXR

## 2016-06-22 NOTE — Progress Notes (Signed)
Pre visit review using our clinic review tool, if applicable. No additional management support is needed unless otherwise documented below in the visit note. 

## 2016-06-22 NOTE — Assessment & Plan Note (Signed)
Worse Albuterol

## 2016-06-22 NOTE — Assessment & Plan Note (Addendum)
9/17 r/o serious pathology Labs EKG ok Call 911 if DOE/CP relapsed CL stress test Labs CXR

## 2016-06-23 ENCOUNTER — Other Ambulatory Visit: Payer: Self-pay | Admitting: Internal Medicine

## 2016-06-23 DIAGNOSIS — R072 Precordial pain: Secondary | ICD-10-CM

## 2016-06-23 LAB — D-DIMER, QUANTITATIVE: D-Dimer, Quant: 0.5 mcg/mL FEU — ABNORMAL HIGH (ref <0.50)

## 2016-06-24 ENCOUNTER — Telehealth: Payer: Self-pay | Admitting: *Deleted

## 2016-06-24 ENCOUNTER — Other Ambulatory Visit (INDEPENDENT_AMBULATORY_CARE_PROVIDER_SITE_OTHER): Payer: Medicare Other

## 2016-06-24 ENCOUNTER — Ambulatory Visit (INDEPENDENT_AMBULATORY_CARE_PROVIDER_SITE_OTHER)
Admission: RE | Admit: 2016-06-24 | Discharge: 2016-06-24 | Disposition: A | Discharge status: Home / Self Care | Payer: Medicare Other | Source: Ambulatory Visit | Attending: Internal Medicine | Admitting: Internal Medicine

## 2016-06-24 DIAGNOSIS — R072 Precordial pain: Secondary | ICD-10-CM

## 2016-06-24 DIAGNOSIS — R0602 Shortness of breath: Secondary | ICD-10-CM | POA: Diagnosis not present

## 2016-06-24 DIAGNOSIS — R0789 Other chest pain: Secondary | ICD-10-CM | POA: Diagnosis not present

## 2016-06-24 LAB — COMPREHENSIVE METABOLIC PANEL
ALBUMIN: 4.2 g/dL (ref 3.5–5.2)
ALT: 29 U/L (ref 0–35)
AST: 38 U/L — AB (ref 0–37)
Alkaline Phosphatase: 63 U/L (ref 39–117)
BILIRUBIN TOTAL: 0.8 mg/dL (ref 0.2–1.2)
BUN: 11 mg/dL (ref 6–23)
CALCIUM: 9.5 mg/dL (ref 8.4–10.5)
CO2: 33 mEq/L — ABNORMAL HIGH (ref 19–32)
CREATININE: 0.8 mg/dL (ref 0.40–1.20)
Chloride: 104 mEq/L (ref 96–112)
GFR: 75.18 mL/min (ref >60.00)
Glucose, Bld: 112 mg/dL — ABNORMAL HIGH (ref 70–99)
Potassium: 4.5 mEq/L (ref 3.5–5.1)
Sodium: 143 mEq/L (ref 135–145)
Total Protein: 7.2 g/dL (ref 6.0–8.3)

## 2016-06-24 MED ORDER — IOPAMIDOL (ISOVUE-370) INJECTION 76%
80.0000 mL | Freq: Once | INTRAVENOUS | Status: AC | PRN
  Administered 2016-06-24: 80 mL via INTRAVENOUS

## 2016-06-24 NOTE — Telephone Encounter (Signed)
Per PCP- she can stop Xarelto based on 06/24/16 Chest CT.  I called pt- no answer. Left mess for patient to call back.

## 2016-06-24 NOTE — Telephone Encounter (Signed)
Inform pt of massage below.

## 2016-06-24 NOTE — Addendum Note (Signed)
Addended by: Cresenciano Lick on: 06/24/2016 10:49 AM   Modules accepted: Orders

## 2016-06-27 ENCOUNTER — Other Ambulatory Visit: Payer: Self-pay | Admitting: Internal Medicine

## 2016-06-27 DIAGNOSIS — R9389 Abnormal findings on diagnostic imaging of other specified body structures: Secondary | ICD-10-CM

## 2016-06-27 NOTE — Telephone Encounter (Signed)
Pt is aware of the CT findings. Feeling OK Will sch MRI Thx

## 2016-06-30 ENCOUNTER — Other Ambulatory Visit: Payer: Self-pay | Admitting: Internal Medicine

## 2016-06-30 DIAGNOSIS — I6523 Occlusion and stenosis of bilateral carotid arteries: Secondary | ICD-10-CM

## 2016-07-04 ENCOUNTER — Other Ambulatory Visit: Payer: Self-pay | Admitting: Internal Medicine

## 2016-07-04 DIAGNOSIS — R0789 Other chest pain: Secondary | ICD-10-CM

## 2016-07-04 DIAGNOSIS — R0602 Shortness of breath: Secondary | ICD-10-CM

## 2016-07-06 ENCOUNTER — Ambulatory Visit (HOSPITAL_COMMUNITY)
Admission: RE | Admit: 2016-07-06 | Discharge: 2016-07-06 | Disposition: A | Discharge status: Home / Self Care | Payer: Medicare Other | Source: Ambulatory Visit | Attending: Cardiovascular Disease | Admitting: Cardiovascular Disease

## 2016-07-06 DIAGNOSIS — I6523 Occlusion and stenosis of bilateral carotid arteries: Secondary | ICD-10-CM | POA: Diagnosis present

## 2016-07-07 ENCOUNTER — Telehealth (HOSPITAL_COMMUNITY): Payer: Self-pay | Admitting: *Deleted

## 2016-07-07 ENCOUNTER — Telehealth: Payer: Self-pay | Admitting: Gastroenterology

## 2016-07-07 ENCOUNTER — Ambulatory Visit
Admission: RE | Admit: 2016-07-07 | Discharge: 2016-07-07 | Disposition: A | Discharge status: Home / Self Care | Payer: Medicare Other | Source: Ambulatory Visit | Attending: Internal Medicine | Admitting: Internal Medicine

## 2016-07-07 ENCOUNTER — Telehealth: Payer: Self-pay | Admitting: *Deleted

## 2016-07-07 DIAGNOSIS — K2289 Other specified disease of esophagus: Secondary | ICD-10-CM

## 2016-07-07 DIAGNOSIS — R9389 Abnormal findings on diagnostic imaging of other specified body structures: Secondary | ICD-10-CM

## 2016-07-07 DIAGNOSIS — K228 Other specified diseases of esophagus: Secondary | ICD-10-CM

## 2016-07-07 MED ORDER — GADOBENATE DIMEGLUMINE 529 MG/ML IV SOLN
14.0000 mL | Freq: Once | INTRAVENOUS | Status: AC | PRN
  Administered 2016-07-07: 14 mL via INTRAVENOUS

## 2016-07-07 NOTE — Telephone Encounter (Signed)
Patient is asymptomatic.  Her symptoms are SOB.  She has a cardiac stress test scheduled on Tuesday to evaluate her SOB.  She denies any dysphagia.  She will come in and see Tye Savoy RNP on 06/18/16. She is advised that I will call her if I have any openings.  Reviewed with Dr. Henrene Pastor DOD.  Per Dr. Henrene Pastor needs BS prior to appt with Tye Savoy RNP   Patient will go for BS to [facility] tomorrow at 10:00 for BS.  She is advised to be 3 hours NPO.    Left message for patient to call back

## 2016-07-07 NOTE — Telephone Encounter (Signed)
Left message on voicemail in reference to upcoming appointment scheduled for  07/12/16. Phone number given for a call back so details instructions can be given. Theresa Aguirre Jacqueline  

## 2016-07-07 NOTE — Telephone Encounter (Signed)
Received call report on 07/07/16 chest MRI. See report in imaging.  PCP is out of office until 07/11/16. Forwarding to Dr. Sharlet Salina for advisement.

## 2016-07-07 NOTE — Telephone Encounter (Signed)
Pt informed

## 2016-07-07 NOTE — Telephone Encounter (Signed)
Please call the patient and let her know that again the area in the esophagus was seen on the MRI. They were not able to tell if this is something that is worrisome or normal. They have recommended for you to get a direct look at the tissue and so we have placed the referral to GI urgently for you to have the EGD (procedure where they put you to sleep and look at the esophagus and can take a biopsy if needed at the time).

## 2016-07-07 NOTE — Telephone Encounter (Signed)
Patient notified of the recommendations. She verbalized understanding of BS for tomorrow

## 2016-07-08 ENCOUNTER — Ambulatory Visit (HOSPITAL_COMMUNITY)
Admission: RE | Admit: 2016-07-08 | Discharge: 2016-07-08 | Disposition: A | Discharge status: Home / Self Care | Payer: Medicare Other | Source: Ambulatory Visit | Attending: Internal Medicine | Admitting: Internal Medicine

## 2016-07-08 ENCOUNTER — Telehealth (HOSPITAL_COMMUNITY): Payer: Self-pay

## 2016-07-08 DIAGNOSIS — K2289 Other specified disease of esophagus: Secondary | ICD-10-CM

## 2016-07-08 DIAGNOSIS — K449 Diaphragmatic hernia without obstruction or gangrene: Secondary | ICD-10-CM | POA: Insufficient documentation

## 2016-07-08 DIAGNOSIS — K228 Other specified diseases of esophagus: Secondary | ICD-10-CM

## 2016-07-08 DIAGNOSIS — K229 Disease of esophagus, unspecified: Secondary | ICD-10-CM | POA: Diagnosis present

## 2016-07-08 NOTE — Telephone Encounter (Signed)
Patient given detailed instructions per Myocardial Perfusion Study Information Sheet for the test on 07/11/2016 at 10:00. Patient notified to arrive 15 minutes early and that it is imperative to arrive on time for appointment to keep from having the test rescheduled.  If you need to cancel or reschedule your appointment, please call the office within 24 hours of your appointment. Failure to do so may result in a cancellation of your appointment, and a $50 no show fee. Patient verbalized understanding.EHK

## 2016-07-11 ENCOUNTER — Ambulatory Visit: Payer: Medicare Other | Admitting: *Deleted

## 2016-07-11 VITALS — Ht 62.0 in | Wt 157.0 lb

## 2016-07-11 DIAGNOSIS — K229 Disease of esophagus, unspecified: Secondary | ICD-10-CM

## 2016-07-11 NOTE — Progress Notes (Signed)
Patient denies any allergies to egg or soy products. Patient denies complications with anesthesia/sedation.  Patient denies oxygen use at home and denies diet medications. Emmi instructions for endoscopy explained but patient refused.  Pamphlet given.

## 2016-07-12 ENCOUNTER — Ambulatory Visit (HOSPITAL_COMMUNITY): Payer: Medicare Other | Attending: Cardiovascular Disease

## 2016-07-12 ENCOUNTER — Other Ambulatory Visit: Payer: Self-pay | Admitting: Internal Medicine

## 2016-07-12 DIAGNOSIS — R0602 Shortness of breath: Secondary | ICD-10-CM | POA: Diagnosis present

## 2016-07-12 DIAGNOSIS — R0789 Other chest pain: Secondary | ICD-10-CM | POA: Insufficient documentation

## 2016-07-12 LAB — MYOCARDIAL PERFUSION IMAGING
CHL CUP NUCLEAR SRS: 0
CHL CUP RESTING HR STRESS: 71 {beats}/min
CSEPPHR: 107 {beats}/min
LV dias vol: 64 mL (ref 46–106)
LVSYSVOL: 19 mL
NUC STRESS TID: 1.03
RATE: 0.31
SDS: 1
SSS: 1

## 2016-07-12 MED ORDER — TECHNETIUM TC 99M TETROFOSMIN IV KIT
10.3000 | PACK | Freq: Once | INTRAVENOUS | Status: AC | PRN
  Administered 2016-07-12: 10 via INTRAVENOUS
  Filled 2016-07-12: qty 10

## 2016-07-12 MED ORDER — TECHNETIUM TC 99M TETROFOSMIN IV KIT
30.0000 | PACK | Freq: Once | INTRAVENOUS | Status: AC | PRN
  Administered 2016-07-12: 30 via INTRAVENOUS
  Filled 2016-07-12: qty 30

## 2016-07-12 MED ORDER — REGADENOSON 0.4 MG/5ML IV SOLN
0.4000 mg | Freq: Once | INTRAVENOUS | Status: AC
  Administered 2016-07-12: 0.4 mg via INTRAVENOUS

## 2016-07-12 MED ORDER — PRAVASTATIN SODIUM 20 MG PO TABS: 20.0000 mg | ORAL_TABLET | Freq: Every day | ORAL | 11 refills | Status: DC

## 2016-07-13 ENCOUNTER — Encounter: Payer: Self-pay | Admitting: Internal Medicine

## 2016-07-13 ENCOUNTER — Ambulatory Visit (AMBULATORY_SURGERY_CENTER): Payer: Medicare Other | Admitting: Internal Medicine

## 2016-07-13 VITALS — BP 115/67 | HR 76 | Temp 98.4°F | Resp 16 | Ht 62.0 in | Wt 157.0 lb

## 2016-07-13 DIAGNOSIS — R933 Abnormal findings on diagnostic imaging of other parts of digestive tract: Secondary | ICD-10-CM

## 2016-07-13 DIAGNOSIS — K229 Disease of esophagus, unspecified: Secondary | ICD-10-CM

## 2016-07-13 MED ORDER — SODIUM CHLORIDE 0.9 % IV SOLN: 500.0000 mL | INTRAVENOUS | Status: DC

## 2016-07-13 NOTE — Patient Instructions (Signed)
YOU HAD AN ENDOSCOPIC PROCEDURE TODAY AT [facility]:   Refer to the procedure report that was given to you for any specific questions about what was found during the examination.  If the procedure report does not answer your questions, please call your gastroenterologist to clarify.  If you requested that your care partner not be given the details of your procedure findings, then the procedure report has been included in a sealed envelope for you to review at your convenience later.  YOU SHOULD EXPECT: Some feelings of bloating in the abdomen. Passage of more gas than usual.  Walking can help get rid of the air that was put into your GI tract during the procedure and reduce the bloating. If you had a lower endoscopy (such as a colonoscopy or flexible sigmoidoscopy) you may notice spotting of blood in your stool or on the toilet paper. If you underwent a bowel prep for your procedure, you may not have a normal bowel movement for a few days.  Please Note:  You might notice some irritation and congestion in your nose or some drainage.  This is from the oxygen used during your procedure.  There is no need for concern and it should clear up in a day or so.  SYMPTOMS TO REPORT IMMEDIATELY:   Following lower endoscopy (colonoscopy or flexible sigmoidoscopy):  Excessive amounts of blood in the stool  Significant tenderness or worsening of abdominal pains  Swelling of the abdomen that is new, acute  Fever of 100F or higher   Following upper endoscopy (EGD)  Vomiting of blood or coffee ground material  New chest pain or pain under the shoulder blades  Painful or persistently difficult swallowing  New shortness of breath  Fever of 100F or higher  Black, tarry-looking stools  For urgent or emergent issues, a gastroenterologist can be reached at any hour by calling [phone].   DIET:  We do recommend a small meal at first, but then you may proceed to your regular diet.  Drink  plenty of fluids but you should avoid alcoholic beverages for 24 hours.  ACTIVITY:  You should plan to take it easy for the rest of today and you should NOT DRIVE or use heavy machinery until tomorrow (because of the sedation medicines used during the test).    FOLLOW UP: Our staff will call the number listed on your records the next business day following your procedure to check on you and address any questions or concerns that you may have regarding the information given to you following your procedure. If we do not reach you, we will leave a message.  However, if you are feeling well and you are not experiencing any problems, there is no need to return our call.  We will assume that you have returned to your regular daily activities without incident.  If any biopsies were taken you will be contacted by phone or by letter within the next 1-3 weeks.  Please call us at [phone] if you have not heard about the biopsies in 3 weeks.    SIGNATURES/CONFIDENTIALITY: You and/or your care partner have signed paperwork which will be entered into your electronic medical record.  These signatures attest to the fact that that the information above on your After Visit Summary has been reviewed and is understood.  Full responsibility of the confidentiality of this discharge information lies with you and/or your care-partner.   Continue taking NEXIUM per Dr. Henrene Pastor. You may resume your  current medications today. Endoscopic ultrasound to evaluate submucosal bulge in hernia sac.  Dr. Blanch Media nurse will contact you regarding this exam. Return to the care of Dr. Alain Marion for on going evaluation of your intermittent shortness of breath. Please call if any questions or concerns.

## 2016-07-13 NOTE — Progress Notes (Signed)
No egg or soy allergy known to patient  No issues with past sedation with any surgeries  or procedures, no intubation problems  No diet pills per patient No home 02 use per patient  No blood thinners per patient   Pt states she took 2 xarelto about 2 weeks ago but was discontinued by plotnikov and told to take ASA 81 mg x 4 instead

## 2016-07-13 NOTE — Op Note (Signed)
[facility] Patient Name: Sarabi Linck Procedure Date: 07/13/2016 3:25 PM MRN: [id number] Endoscopist: Docia Chuck. Henrene Pastor , MD Age: 70 Referring MD:  Date of Birth: 21-Sep-1946 Gender: Female Account #: [id number] Procedure:                Upper GI endoscopy Indications:              Abnormal UGI series, Abnormal CT of the GI tract,                            Abnormal MRI of the GI tract Medicines:                Monitored Anesthesia Care Procedure:                Pre-Anesthesia Assessment:                           - Prior to the procedure, a History and Physical                            was performed, and patient medications and                            allergies were reviewed. The patient's tolerance of                            previous anesthesia was also reviewed. The risks                            and benefits of the procedure and the sedation                            options and risks were discussed with the patient.                            All questions were answered, and informed consent                            was obtained. Prior Anticoagulants: The patient has                            taken no previous anticoagulant or antiplatelet                            agents. ASA Grade Assessment: II - A patient with                            mild systemic disease. After reviewing the risks                            and benefits, the patient was deemed in                            satisfactory condition to undergo the procedure.  After obtaining informed consent, the endoscope was                            passed under direct vision. Throughout the                            procedure, the patient's blood pressure, pulse, and                            oxygen saturations were monitored continuously. The                            Model GIF-[id number]) scope was introduced                            through the mouth, and  advanced to the second part                            of duodenum. The upper GI endoscopy was                            accomplished without difficulty. The patient                            tolerated the procedure well. Scope In: Scope Out: Findings:                 LA Grade A (one or more mucosal breaks less than 5                            mm, not extending between tops of 2 mucosal folds)                            esophagitis was found.                           The exam of the esophagus was otherwise normal.                           A medium-sized hiatal hernia was present. Within                            the hernia sac was a 2 cm submucosal bulge.                           A single 3 mm no bleeding angiodysplastic lesion                            was found in the gastric proximal stomach region of                            the cardia.  The exam of the stomach was otherwise normal.                           The examined duodenum was normal. Complications:            No immediate complications. Estimated Blood Loss:     Estimated blood loss: none. Impression:               - LA Grade A reflux esophagitis.                           - Medium-sized hiatal hernia, with submucosal bulge                            of uncertain clinical significance.                           - A single non-bleeding angiodysplastic lesion in                            the stomach.                           - Normal examined duodenum.                           - No specimens collected. Recommendation:           1. Continue Nexium daily                           2. Endoscopic ultrasound to evaluate submucosal                            bulge in hernia sac. We will contact you regarding                            this exam.                           3. Return to the care of Dr. Alain Marion for ongoing                            evaluation of your intermittent shortness of  breath John N. Henrene Pastor, MD 07/13/2016 3:58:13 PM This report has been signed electronically.

## 2016-07-13 NOTE — Progress Notes (Signed)
No problems noted in the recovery room. maw 

## 2016-07-13 NOTE — Progress Notes (Signed)
Pt is HIPPA.  Discharge instructions given to the pt only.  Dr. Henrene Pastor spoke with the pt while she wake.maw  No problems noted in the recovery room. maw

## 2016-07-13 NOTE — Progress Notes (Signed)
To recovery vss report to RN 

## 2016-07-14 ENCOUNTER — Telehealth: Payer: Self-pay | Admitting: *Deleted

## 2016-07-14 ENCOUNTER — Other Ambulatory Visit: Payer: Self-pay | Admitting: *Deleted

## 2016-07-14 MED ORDER — PRAVASTATIN SODIUM 20 MG PO TABS: 20.0000 mg | ORAL_TABLET | Freq: Every day | ORAL | 3 refills | Status: DC

## 2016-07-14 NOTE — Telephone Encounter (Signed)
  Follow up Call-  Call back number 07/13/2016  Post procedure Call Back phone  # [phone]  Permission to leave phone message Yes  Some recent data might be hidden     Patient questions:  Do you have a fever, pain , or abdominal swelling? No. Pain Score  0 *  Have you tolerated food without any problems? Yes.    Have you been able to return to your normal activities? Yes.    Do you have any questions about your discharge instructions: Diet   No. Medications  No. Follow up visit  No.  Do you have questions or concerns about your Care? No.  Actions: * If pain score is 4 or above: No action needed, pain <4.

## 2016-07-18 ENCOUNTER — Other Ambulatory Visit: Payer: Self-pay

## 2016-07-18 ENCOUNTER — Ambulatory Visit: Payer: Medicare Other | Admitting: Nurse Practitioner

## 2016-07-18 ENCOUNTER — Telehealth: Payer: Self-pay

## 2016-07-18 DIAGNOSIS — R933 Abnormal findings on diagnostic imaging of other parts of digestive tract: Secondary | ICD-10-CM

## 2016-07-18 NOTE — Telephone Encounter (Signed)
-----   Message from Milus Banister, MD sent at 07/15/2016 12:47 PM EDT ----- Regarding: RE: EUS Ok, We'll get it scheduled.    Presleigh Feldstein, She needs upper EUS, radial +/- linear.  ++MAC, next available EUS Thursday for abnormal stomach.  Thanks  DJ  ----- Message ----- From: Irene Shipper, MD Sent: 07/15/2016  11:58 AM To: Milus Banister, MD Subject: RE: EUS                                        Dan, I would appreciate EUS evaluation of the submucosal bulge (circled on the report) in the hernia sac, just to be certain. Thank you. John ----- Message ----- From: Milus Banister, MD Sent: 07/14/2016  12:20 PM To: Irene Shipper, MD Subject: RE: EUS                                        John,  I reviewed your EGD.  Also saw the UGI report, the radiologist seems to think that the mass is a component of the paraesophageal hernia.  I'm happy to take a look with EUS to exclude other possibilities but what do you think about that idea?  DJ   ----- Message ----- From: Barron Alvine, RN Sent: 07/14/2016  11:57 AM To: Milus Banister, MD Subject: FW: EUS                                        Have you seen this? ----- Message ----- From: Algernon Huxley, RN Sent: 07/14/2016  11:41 AM To: Barron Alvine, RN Subject: EUS                                            Sebastian Lurz,  Dr. Henrene Pastor states this pt needs an EUS to evaluate submucosal bulge in hernia sac. Not sure if [facility] sent anything to you or not.  Thanks, [employer]

## 2016-07-18 NOTE — Telephone Encounter (Signed)
I spoke to the pt and she states she is going out of town for 3 weeks and has been scheduled for 09/08/16.  She was instructed and instructions mailed to the home.  I will also send to Dr Ardis Hughs and Dr Henrene Pastor as an Juluis Rainier.

## 2016-07-18 NOTE — Telephone Encounter (Signed)
Ok, thanks.

## 2016-08-19 ENCOUNTER — Telehealth: Payer: Self-pay | Admitting: Gastroenterology

## 2016-08-19 NOTE — Telephone Encounter (Signed)
Pt has decided that she no longer wishes to have any procedures.  I advised her of the importance of the test to determine the significance of the mass in the stomach.  Pt continues to decline appt. I will forward to Dr Ardis Hughs and Henrene Pastor.  Appt was cancelled.

## 2016-08-21 NOTE — Telephone Encounter (Signed)
She is well informed. Her wishes noted.

## 2016-08-22 ENCOUNTER — Ambulatory Visit: Payer: Medicare Other | Admitting: Internal Medicine

## 2016-09-05 ENCOUNTER — Ambulatory Visit (INDEPENDENT_AMBULATORY_CARE_PROVIDER_SITE_OTHER): Payer: Medicare Other | Admitting: Internal Medicine

## 2016-09-05 ENCOUNTER — Telehealth: Payer: Self-pay | Admitting: Gastroenterology

## 2016-09-05 ENCOUNTER — Telehealth: Payer: Self-pay | Admitting: Internal Medicine

## 2016-09-05 ENCOUNTER — Encounter: Payer: Self-pay | Admitting: Internal Medicine

## 2016-09-05 DIAGNOSIS — I6523 Occlusion and stenosis of bilateral carotid arteries: Secondary | ICD-10-CM

## 2016-09-05 DIAGNOSIS — K2100 Gastro-esophageal reflux disease with esophagitis, without bleeding: Secondary | ICD-10-CM

## 2016-09-05 DIAGNOSIS — D490 Neoplasm of unspecified behavior of digestive system: Secondary | ICD-10-CM

## 2016-09-05 DIAGNOSIS — J449 Chronic obstructive pulmonary disease, unspecified: Secondary | ICD-10-CM

## 2016-09-05 DIAGNOSIS — F17200 Nicotine dependence, unspecified, uncomplicated: Secondary | ICD-10-CM

## 2016-09-05 DIAGNOSIS — F331 Major depressive disorder, recurrent, moderate: Secondary | ICD-10-CM

## 2016-09-05 MED ORDER — ALPRAZOLAM 1 MG PO TABS: ORAL_TABLET | ORAL | 1 refills | Status: DC

## 2016-09-05 MED ORDER — PANTOPRAZOLE SODIUM 40 MG PO TBEC: 40.0000 mg | DELAYED_RELEASE_TABLET | Freq: Every day | ORAL | 3 refills | Status: DC

## 2016-09-05 MED ORDER — FLUTICASONE-SALMETEROL 250-50 MCG/DOSE IN AEPB: 1.0000 | INHALATION_SPRAY | Freq: Two times a day (BID) | RESPIRATORY_TRACT | 11 refills | Status: DC

## 2016-09-05 NOTE — Assessment & Plan Note (Signed)
Pt stopped Paxil Xanax prn  Potential benefits of a long term benzodiazepines  use as well as potential risks  and complications were explained to the patient and were aknowledged.

## 2016-09-05 NOTE — Progress Notes (Signed)
Subjective:  Patient ID: Theresa Aguirre, female    DOB: 1946-07-25  Age: 70 y.o. MRN: [id number]  CC: No chief complaint on file.   HPI Theresa Aguirre presents to discuss her EGD. She cancelled her endo US/bx for ?reason. F/u COPD, anxiety, GERD  Outpatient Medications Prior to Visit  Medication Sig Dispense Refill  . albuterol (PROVENTIL HFA;VENTOLIN HFA) 108 (90 Base) MCG/ACT inhaler Inhale 2 puffs into the lungs every 6 (six) hours as needed. 1 Inhaler 3  . ALPRAZolam (XANAX) 1 MG tablet TAKE 1 TABLET BY MOUTH FOUR TIMES DAILY AS NEEDED FOR SLEEP OR ANXIETY 360 tablet 1  . aspirin (BAYER ASPIRIN) 325 MG tablet Take 0.5 tablets (162 mg total) by mouth daily. 100 tablet 3  . fluticasone (FLONASE) 50 MCG/ACT nasal spray Place 2 sprays into both nostrils daily. 16 g 6  . fluticasone furoate-vilanterol (BREO ELLIPTA) 100-25 MCG/INH AEPB Inhale 1 puff into the lungs daily. 1 each 11  . hydrochlorothiazide (MICROZIDE) 12.5 MG capsule TAKE ONE CAPSULE BY MOUTH DAILY 30 capsule 5  . clobetasol cream (TEMOVATE) 0.05 % APPLY TO AFFECTED AREA BID  3  . esomeprazole (NEXIUM) 40 MG capsule Take 1 capsule (40 mg total) by mouth daily. (Patient not taking: Reported on 09/05/2016) 30 capsule 5  . lipase/protease/amylase (CREON) 36000 UNITS CPEP capsule Take 1 capsule (36,000 Units total) by mouth 3 (three) times daily before meals. (Patient not taking: Reported on 09/05/2016) 90 capsule 11  . PARoxetine (PAXIL) 10 MG tablet TAKE 1 TABLET BY MOUTH AT BEDTIME (Patient not taking: Reported on 09/05/2016) 30 tablet 5  . pravastatin (PRAVACHOL) 20 MG tablet Take 1 tablet (20 mg total) by mouth daily. (Patient not taking: Reported on 09/05/2016) 30 tablet 3   Facility-Administered Medications Prior to Visit  Medication Dose Route Frequency Provider Last Rate Last Dose  . 0.9 %  sodium chloride infusion  500 mL Intravenous Continuous Irene Shipper, MD        ROS Review of Systems  Constitutional: Negative  for activity change, appetite change, chills, fatigue and unexpected weight change.  HENT: Negative for congestion, mouth sores and sinus pressure.   Eyes: Negative for visual disturbance.  Respiratory: Positive for cough and shortness of breath. Negative for chest tightness.   Gastrointestinal: Negative for abdominal pain and nausea.  Genitourinary: Negative for difficulty urinating, frequency and vaginal pain.  Musculoskeletal: Negative for back pain and gait problem.  Skin: Negative for pallor and rash.  Neurological: Negative for dizziness, tremors, weakness, numbness and headaches.  Psychiatric/Behavioral: Negative for confusion and sleep disturbance.    Objective:  BP (!) 150/82   Pulse 92   Temp 97.6 F (36.4 C) (Oral)   Resp 18   Ht 5\' 3"  (1.6 m)   Wt 159 lb (72.1 kg)   SpO2 92%   BMI 28.17 kg/m   BP Readings from Last 3 Encounters:  09/05/16 (!) 150/82  07/13/16 115/67  06/22/16 140/90    Wt Readings from Last 3 Encounters:  09/05/16 159 lb (72.1 kg)  07/13/16 157 lb (71.2 kg)  07/12/16 157 lb (71.2 kg)    Physical Exam  Constitutional: She appears well-developed. No distress.  HENT:  Head: Normocephalic.  Right Ear: External ear normal.  Left Ear: External ear normal.  Nose: Nose normal.  Mouth/Throat: Oropharynx is clear and moist.  Eyes: Conjunctivae are normal. Pupils are equal, round, and reactive to light. Right eye exhibits no discharge. Left eye exhibits no discharge.  Neck: Normal  range of motion. Neck supple. No JVD present. No tracheal deviation present. No thyromegaly present.  Cardiovascular: Normal rate, regular rhythm and normal heart sounds.   Pulmonary/Chest: No stridor. No respiratory distress. She has wheezes.  Abdominal: Soft. Bowel sounds are normal. She exhibits no distension and no mass. There is no tenderness. There is no rebound and no guarding.  Musculoskeletal: She exhibits no edema or tenderness.  Lymphadenopathy:    She has no  cervical adenopathy.  Neurological: She displays normal reflexes. No cranial nerve deficit. She exhibits normal muscle tone. Coordination normal.  Skin: No rash noted. No erythema.  Psychiatric: She has a normal mood and affect. Her behavior is normal. Judgment and thought content normal.    Lab Results  Component Value Date   WBC 7.2 09/29/2015   HGB 15.5 (H) 09/29/2015   HCT 46.9 (H) 09/29/2015   PLT 176.0 09/29/2015   GLUCOSE 112 (H) 06/24/2016   CHOL 199 05/01/2015   TRIG 92.0 05/01/2015   HDL 42.00 05/01/2015   LDLDIRECT 162.8 06/28/2011   LDLCALC 139 (H) 05/01/2015   ALT 29 06/24/2016   AST 38 (H) 06/24/2016   NA 143 06/24/2016   K 4.5 06/24/2016   CL 104 06/24/2016   CREATININE 0.80 06/24/2016   BUN 11 06/24/2016   CO2 33 (H) 06/24/2016   TSH 1.52 09/29/2015   HGBA1C 5.6 01/02/2008    Dg Esophagus  Result Date: 07/08/2016 CLINICAL DATA:  Esophageal mass. EXAM: ESOPHOGRAM/BARIUM SWALLOW TECHNIQUE: Combined double contrast and single contrast examination performed using effervescent crystals, thick barium liquid, and thin barium liquid. FLUOROSCOPY TIME:  Fluoroscopy Time:  1.6 minutes Radiation Exposure Index (if provided by the fluoroscopic device): 8.2 mGyair kerma Number of Acquired Spot Images: 2 COMPARISON:  Chest CT and MRI 06/24/2016 and 07/07/2016 respectively FINDINGS: Oblique pharyngeal imaging was normal. There is no aspiration or visible obstructive process. When upright, the esophagus has a normal smooth appearance. When placed RAO an outpouching contiguous with the rugal folds developed along the lower esophagus, which during real-time fluoroscopy filled with the stomach. The patient was brought upright to determine clarify if this was hiatal hernia or diverticulum, but only a small sliding hiatal hernia was visualized. Patient was subsequently positioned LAO and the outpouching was even more dramatic, and clearly contained rugal folds. On axial T2 haste imaging,  there is subtle T2 hyperintensity within this masslike structure, supportive of hernia. IMPRESSION: Inducible sliding and paraesophageal hernia that correlates with masslike appearance on recent CT and MRI. Negative for mucosal lesion or extrinsic narrowing. Electronically Signed   By: Monte Fantasia M.D.   On: 07/08/2016 11:07    Assessment & Plan:   There are no diagnoses linked to this encounter. I am having Theresa Aguirre maintain her fluticasone, esomeprazole, lipase/protease/amylase, aspirin, clobetasol cream, ALPRAZolam, hydrochlorothiazide, PARoxetine, albuterol, fluticasone furoate-vilanterol, and pravastatin. We will continue to administer sodium chloride.  No orders of the defined types were placed in this encounter.    Follow-up: No Follow-up on file.  Walker Kehr, MD

## 2016-09-05 NOTE — Progress Notes (Signed)
Pre visit review using our clinic review tool, if applicable. No additional management support is needed unless otherwise documented below in the visit note. 

## 2016-09-05 NOTE — Telephone Encounter (Signed)
Left message on machine to call back  

## 2016-09-05 NOTE — Assessment & Plan Note (Signed)
Change to Advair per pt's request

## 2016-09-05 NOTE — Assessment & Plan Note (Signed)
Protonix Potential benefits of a long term PPI use as well as potential risks  and complications were explained to the patient and were aknowledged. 

## 2016-09-05 NOTE — Telephone Encounter (Signed)
A user error has taken place: Error °

## 2016-09-05 NOTE — Assessment & Plan Note (Signed)
We discuss her EGD results. She cancelled her endo US/bx for ?reason. Will re-schedule

## 2016-09-05 NOTE — Assessment & Plan Note (Signed)
Refractory Discussed 

## 2016-09-08 ENCOUNTER — Ambulatory Visit (HOSPITAL_COMMUNITY): Admit: 2016-09-08 | Payer: Medicare Other | Admitting: Gastroenterology

## 2016-09-08 ENCOUNTER — Encounter (HOSPITAL_COMMUNITY): Payer: Self-pay

## 2016-09-08 ENCOUNTER — Other Ambulatory Visit: Payer: Self-pay

## 2016-09-08 DIAGNOSIS — R933 Abnormal findings on diagnostic imaging of other parts of digestive tract: Secondary | ICD-10-CM

## 2016-09-08 SURGERY — UPPER ENDOSCOPIC ULTRASOUND (EUS) RADIAL
Anesthesia: Monitor Anesthesia Care

## 2016-09-08 NOTE — Telephone Encounter (Signed)
The pt was rescheduled for 09/22/16 2 pm, she was re instructed and states she does not need instructions mailed to the home.  Pt verbalized understanding.

## 2016-09-19 ENCOUNTER — Encounter (HOSPITAL_COMMUNITY): Payer: Self-pay

## 2016-09-22 ENCOUNTER — Encounter (HOSPITAL_COMMUNITY): Payer: Self-pay

## 2016-09-22 ENCOUNTER — Encounter (HOSPITAL_COMMUNITY)
Admission: RE | Disposition: A | Discharge status: Home / Self Care | Payer: Self-pay | Source: Ambulatory Visit | Attending: Gastroenterology

## 2016-09-22 ENCOUNTER — Ambulatory Visit (HOSPITAL_COMMUNITY): Payer: Medicare Other | Admitting: Anesthesiology

## 2016-09-22 ENCOUNTER — Ambulatory Visit (HOSPITAL_COMMUNITY)
Admission: RE | Admit: 2016-09-22 | Discharge: 2016-09-22 | Disposition: A | Discharge status: Home / Self Care | Payer: Medicare Other | Source: Ambulatory Visit | Attending: Gastroenterology | Admitting: Gastroenterology

## 2016-09-22 DIAGNOSIS — F17200 Nicotine dependence, unspecified, uncomplicated: Secondary | ICD-10-CM | POA: Insufficient documentation

## 2016-09-22 DIAGNOSIS — Z7982 Long term (current) use of aspirin: Secondary | ICD-10-CM | POA: Diagnosis not present

## 2016-09-22 DIAGNOSIS — J449 Chronic obstructive pulmonary disease, unspecified: Secondary | ICD-10-CM | POA: Insufficient documentation

## 2016-09-22 DIAGNOSIS — I10 Essential (primary) hypertension: Secondary | ICD-10-CM | POA: Insufficient documentation

## 2016-09-22 DIAGNOSIS — Z79899 Other long term (current) drug therapy: Secondary | ICD-10-CM | POA: Diagnosis not present

## 2016-09-22 DIAGNOSIS — K219 Gastro-esophageal reflux disease without esophagitis: Secondary | ICD-10-CM | POA: Diagnosis not present

## 2016-09-22 DIAGNOSIS — Z7951 Long term (current) use of inhaled steroids: Secondary | ICD-10-CM | POA: Insufficient documentation

## 2016-09-22 DIAGNOSIS — K3189 Other diseases of stomach and duodenum: Secondary | ICD-10-CM

## 2016-09-22 DIAGNOSIS — R933 Abnormal findings on diagnostic imaging of other parts of digestive tract: Secondary | ICD-10-CM

## 2016-09-22 HISTORY — PX: EUS: SHX5427

## 2016-09-22 SURGERY — ESOPHAGEAL ENDOSCOPIC ULTRASOUND (EUS) RADIAL
Anesthesia: Monitor Anesthesia Care

## 2016-09-22 MED ORDER — PROPOFOL 500 MG/50ML IV EMUL
INTRAVENOUS | Status: DC | PRN
  Administered 2016-09-22 (×3): 20 mg via INTRAVENOUS
  Administered 2016-09-22: 40 mg via INTRAVENOUS
  Administered 2016-09-22: 20 mg via INTRAVENOUS

## 2016-09-22 MED ORDER — PROPOFOL 500 MG/50ML IV EMUL
INTRAVENOUS | Status: DC | PRN
  Administered 2016-09-22: 100 ug/kg/min via INTRAVENOUS

## 2016-09-22 MED ORDER — PROPOFOL 10 MG/ML IV BOLUS
INTRAVENOUS | Status: AC
  Filled 2016-09-22: qty 40

## 2016-09-22 MED ORDER — LACTATED RINGERS IV SOLN
INTRAVENOUS | Status: DC
  Administered 2016-09-22: 1000 mL via INTRAVENOUS

## 2016-09-22 MED ORDER — SODIUM CHLORIDE 0.9 % IV SOLN: INTRAVENOUS | Status: DC

## 2016-09-22 NOTE — Anesthesia Preprocedure Evaluation (Addendum)
Anesthesia Evaluation  Patient identified by MRN, date of birth, ID band Patient awake    Reviewed: Allergy & Precautions, NPO status , Patient's Chart, lab work & pertinent test results  Airway Mallampati: II  TM Distance: >3 FB Neck ROM: Full    Dental no notable dental hx.    Pulmonary asthma , COPD, Current Smoker,    Pulmonary exam normal breath sounds clear to auscultation       Cardiovascular hypertension, Normal cardiovascular exam Rhythm:Regular Rate:Normal     Neuro/Psych negative neurological ROS  negative psych ROS   GI/Hepatic Neg liver ROS, GERD  Medicated,  Endo/Other  negative endocrine ROS  Renal/GU negative Renal ROS  negative genitourinary   Musculoskeletal negative musculoskeletal ROS (+)   Abdominal   Peds negative pediatric ROS (+)  Hematology negative hematology ROS (+)   Anesthesia Other Findings   Reproductive/Obstetrics negative OB ROS                             Anesthesia Physical Anesthesia Plan  ASA: III  Anesthesia Plan: MAC   Post-op Pain Management:    Induction: Intravenous  Airway Management Planned: Nasal Cannula  Additional Equipment:   Intra-op Plan:   Post-operative Plan:   Informed Consent: I have reviewed the patients History and Physical, chart, labs and discussed the procedure including the risks, benefits and alternatives for the proposed anesthesia with the patient or authorized representative who has indicated his/her understanding and acceptance.   Dental advisory given  Plan Discussed with: CRNA and Surgeon  Anesthesia Plan Comments:         Anesthesia Quick Evaluation

## 2016-09-22 NOTE — Anesthesia Postprocedure Evaluation (Signed)
Anesthesia Post Note  Patient: Theresa Aguirre  Procedure(s) Performed: Procedure(s) (LRB): ESOPHAGEAL ENDOSCOPIC ULTRASOUND (EUS) RADIAL (N/A)  Patient location during evaluation: PACU Anesthesia Type: MAC Level of consciousness: awake and alert Pain management: pain level controlled Vital Signs Assessment: post-procedure vital signs reviewed and stable Respiratory status: spontaneous breathing, nonlabored ventilation, respiratory function stable and patient connected to nasal cannula oxygen Cardiovascular status: stable and blood pressure returned to baseline Anesthetic complications: no    Last Vitals:  Vitals:   09/22/16 1307  BP: (!) 167/86  Pulse: 96  Resp: 12  Temp: 36.6 C    Last Pain:  Vitals:   09/22/16 1307  TempSrc: Oral                 Kent Riendeau S

## 2016-09-22 NOTE — H&P (View-Only) (Signed)
Subjective:  Patient ID: Theresa Aguirre, female    DOB: Apr 01, 1946  Age: 70 y.o. MRN: [id number]  CC: No chief complaint on file.   HPI Theresa Aguirre presents to discuss her EGD. She cancelled her endo US/bx for ?reason. F/u COPD, anxiety, GERD  Outpatient Medications Prior to Visit  Medication Sig Dispense Refill  . albuterol (PROVENTIL HFA;VENTOLIN HFA) 108 (90 Base) MCG/ACT inhaler Inhale 2 puffs into the lungs every 6 (six) hours as needed. 1 Inhaler 3  . ALPRAZolam (XANAX) 1 MG tablet TAKE 1 TABLET BY MOUTH FOUR TIMES DAILY AS NEEDED FOR SLEEP OR ANXIETY 360 tablet 1  . aspirin (BAYER ASPIRIN) 325 MG tablet Take 0.5 tablets (162 mg total) by mouth daily. 100 tablet 3  . fluticasone (FLONASE) 50 MCG/ACT nasal spray Place 2 sprays into both nostrils daily. 16 g 6  . fluticasone furoate-vilanterol (BREO ELLIPTA) 100-25 MCG/INH AEPB Inhale 1 puff into the lungs daily. 1 each 11  . hydrochlorothiazide (MICROZIDE) 12.5 MG capsule TAKE ONE CAPSULE BY MOUTH DAILY 30 capsule 5  . clobetasol cream (TEMOVATE) 0.05 % APPLY TO AFFECTED AREA BID  3  . esomeprazole (NEXIUM) 40 MG capsule Take 1 capsule (40 mg total) by mouth daily. (Patient not taking: Reported on 09/05/2016) 30 capsule 5  . lipase/protease/amylase (CREON) 36000 UNITS CPEP capsule Take 1 capsule (36,000 Units total) by mouth 3 (three) times daily before meals. (Patient not taking: Reported on 09/05/2016) 90 capsule 11  . PARoxetine (PAXIL) 10 MG tablet TAKE 1 TABLET BY MOUTH AT BEDTIME (Patient not taking: Reported on 09/05/2016) 30 tablet 5  . pravastatin (PRAVACHOL) 20 MG tablet Take 1 tablet (20 mg total) by mouth daily. (Patient not taking: Reported on 09/05/2016) 30 tablet 3   Facility-Administered Medications Prior to Visit  Medication Dose Route Frequency Provider Last Rate Last Dose  . 0.9 %  sodium chloride infusion  500 mL Intravenous Continuous Irene Shipper, MD        ROS Review of Systems  Constitutional: Negative  for activity change, appetite change, chills, fatigue and unexpected weight change.  HENT: Negative for congestion, mouth sores and sinus pressure.   Eyes: Negative for visual disturbance.  Respiratory: Positive for cough and shortness of breath. Negative for chest tightness.   Gastrointestinal: Negative for abdominal pain and nausea.  Genitourinary: Negative for difficulty urinating, frequency and vaginal pain.  Musculoskeletal: Negative for back pain and gait problem.  Skin: Negative for pallor and rash.  Neurological: Negative for dizziness, tremors, weakness, numbness and headaches.  Psychiatric/Behavioral: Negative for confusion and sleep disturbance.    Objective:  BP (!) 150/82   Pulse 92   Temp 97.6 F (36.4 C) (Oral)   Resp 18   Ht 5\' 3"  (1.6 m)   Wt 159 lb (72.1 kg)   SpO2 92%   BMI 28.17 kg/m   BP Readings from Last 3 Encounters:  09/05/16 (!) 150/82  07/13/16 115/67  06/22/16 140/90    Wt Readings from Last 3 Encounters:  09/05/16 159 lb (72.1 kg)  07/13/16 157 lb (71.2 kg)  07/12/16 157 lb (71.2 kg)    Physical Exam  Constitutional: She appears well-developed. No distress.  HENT:  Head: Normocephalic.  Right Ear: External ear normal.  Left Ear: External ear normal.  Nose: Nose normal.  Mouth/Throat: Oropharynx is clear and moist.  Eyes: Conjunctivae are normal. Pupils are equal, round, and reactive to light. Right eye exhibits no discharge. Left eye exhibits no discharge.  Neck: Normal  range of motion. Neck supple. No JVD present. No tracheal deviation present. No thyromegaly present.  Cardiovascular: Normal rate, regular rhythm and normal heart sounds.   Pulmonary/Chest: No stridor. No respiratory distress. She has wheezes.  Abdominal: Soft. Bowel sounds are normal. She exhibits no distension and no mass. There is no tenderness. There is no rebound and no guarding.  Musculoskeletal: She exhibits no edema or tenderness.  Lymphadenopathy:    She has no  cervical adenopathy.  Neurological: She displays normal reflexes. No cranial nerve deficit. She exhibits normal muscle tone. Coordination normal.  Skin: No rash noted. No erythema.  Psychiatric: She has a normal mood and affect. Her behavior is normal. Judgment and thought content normal.    Lab Results  Component Value Date   WBC 7.2 09/29/2015   HGB 15.5 (H) 09/29/2015   HCT 46.9 (H) 09/29/2015   PLT 176.0 09/29/2015   GLUCOSE 112 (H) 06/24/2016   CHOL 199 05/01/2015   TRIG 92.0 05/01/2015   HDL 42.00 05/01/2015   LDLDIRECT 162.8 06/28/2011   LDLCALC 139 (H) 05/01/2015   ALT 29 06/24/2016   AST 38 (H) 06/24/2016   NA 143 06/24/2016   K 4.5 06/24/2016   CL 104 06/24/2016   CREATININE 0.80 06/24/2016   BUN 11 06/24/2016   CO2 33 (H) 06/24/2016   TSH 1.52 09/29/2015   HGBA1C 5.6 01/02/2008    Dg Esophagus  Result Date: 07/08/2016 CLINICAL DATA:  Esophageal mass. EXAM: ESOPHOGRAM/BARIUM SWALLOW TECHNIQUE: Combined double contrast and single contrast examination performed using effervescent crystals, thick barium liquid, and thin barium liquid. FLUOROSCOPY TIME:  Fluoroscopy Time:  1.6 minutes Radiation Exposure Index (if provided by the fluoroscopic device): 8.2 mGyair kerma Number of Acquired Spot Images: 2 COMPARISON:  Chest CT and MRI 06/24/2016 and 07/07/2016 respectively FINDINGS: Oblique pharyngeal imaging was normal. There is no aspiration or visible obstructive process. When upright, the esophagus has a normal smooth appearance. When placed RAO an outpouching contiguous with the rugal folds developed along the lower esophagus, which during real-time fluoroscopy filled with the stomach. The patient was brought upright to determine clarify if this was hiatal hernia or diverticulum, but only a small sliding hiatal hernia was visualized. Patient was subsequently positioned LAO and the outpouching was even more dramatic, and clearly contained rugal folds. On axial T2 haste imaging,  there is subtle T2 hyperintensity within this masslike structure, supportive of hernia. IMPRESSION: Inducible sliding and paraesophageal hernia that correlates with masslike appearance on recent CT and MRI. Negative for mucosal lesion or extrinsic narrowing. Electronically Signed   By: Monte Fantasia M.D.   On: 07/08/2016 11:07    Assessment & Plan:   There are no diagnoses linked to this encounter. I am having Ms. Daun maintain her fluticasone, esomeprazole, lipase/protease/amylase, aspirin, clobetasol cream, ALPRAZolam, hydrochlorothiazide, PARoxetine, albuterol, fluticasone furoate-vilanterol, and pravastatin. We will continue to administer sodium chloride.  No orders of the defined types were placed in this encounter.    Follow-up: No Follow-up on file.  Walker Kehr, MD

## 2016-09-22 NOTE — Interval H&P Note (Signed)
History and Physical Interval Note:  09/22/2016 1:50 PM  Theresa Aguirre  has presented today for surgery, with the diagnosis of abnormal stomach   The various methods of treatment have been discussed with the patient and family. After consideration of risks, benefits and other options for treatment, the patient has consented to  Procedure(s): ESOPHAGEAL ENDOSCOPIC ULTRASOUND (EUS) RADIAL (N/A) as a surgical intervention .  The patient's history has been reviewed, patient examined, no change in status, stable for surgery.  I have reviewed the patient's chart and labs.  Questions were answered to the patient's satisfaction.     Milus Banister

## 2016-09-22 NOTE — Op Note (Signed)
[facility] Patient Name: Theresa Aguirre Procedure Date: 09/22/2016 MRN: [id number] Attending MD: Milus Banister , MD Date of Birth: 12-Jul-1946 CSN: [id number] Age: 70 Admit Type: Outpatient Procedure:                Upper EUS Indications:              Gastric mucosal mass/polyp found on endoscopy Dr.                            Henrene Pastor EGD 1-2 months ago: submucosal lesion within                            hiatal hernia segment Providers:                Milus Banister, MD, Cleda Daub, RN, William Dalton, Technician Referring MD:              Medicines:                Monitored Anesthesia Care Complications:            No immediate complications. Estimated blood loss:                            None. Estimated Blood Loss:     Estimated blood loss: none. Procedure:                Pre-Anesthesia Assessment:                           - Prior to the procedure, a History and Physical                            was performed, and patient medications and                            allergies were reviewed. The patient's tolerance of                            previous anesthesia was also reviewed. The risks                            and benefits of the procedure and the sedation                            options and risks were discussed with the patient.                            All questions were answered, and informed consent                            was obtained. Prior Anticoagulants: The patient has                            taken no  previous anticoagulant or antiplatelet                            agents. ASA Grade Assessment: II - A patient with                            mild systemic disease. After reviewing the risks                            and benefits, the patient was deemed in                            satisfactory condition to undergo the procedure.                           After obtaining informed consent, the  endoscope was                            passed under direct vision. Throughout the                            procedure, the patient's blood pressure, pulse, and                            oxygen saturations were monitored continuously. The                            [id number]) scope was introduced through                            the mouth, and advanced to the cricopharyngeal                            esophagus. The patient tolerated the procedure well. Findings:      Endoscopic Finding :      I was unable to pass the large diameter radial echoendoscope from the       orpharygous into the proximal esophagus due to what appeared to be       tortuous cricopharyngeus region. I attempted to then pass standard adult       gastroscope into the proximal esophagus and this was not possible       either. There were no mucosal lesions, but the very proximal esophagus       seemed slightly stenotic and very tortuous. Impression:               - Incomplete examination: I was not able to advance                            the echoendoscope nor a standard adult gastroscope                            into the proximal esophagus due to what appeared by  to very tortuous and perhaps slightly stenotic                            proximal esophagus. Moderate Sedation:      N/A- Per Anesthesia Care Recommendation:           - Discharge patient to home (ambulatory).                           - Will communicate these findings with Dr. Henrene Pastor. Procedure Code(s):        --- Professional ---                           [phone], Esophagoscopy, flexible, transoral; with                            endoscopic ultrasound examination Diagnosis Code(s):        --- Professional ---                           K31.89, Other diseases of stomach and duodenum CPT copyright 2016 [employer]. All rights reserved. The codes documented in this report are preliminary and upon  coder review may  be revised to meet current compliance requirements. Milus Banister, MD 09/22/2016 2:25:35 PM This report has been signed electronically. Number of Addenda: 0

## 2016-09-22 NOTE — Discharge Instructions (Signed)

## 2016-09-22 NOTE — Transfer of Care (Signed)
Immediate Anesthesia Transfer of Care Note  Patient: Theresa Aguirre  Procedure(s) Performed: Procedure(s): ESOPHAGEAL ENDOSCOPIC ULTRASOUND (EUS) RADIAL (N/A)  Patient Location: PACU  Anesthesia Type:MAC  Level of Consciousness: awake, alert  and oriented  Airway & Oxygen Therapy: Patient Spontanous Breathing and Patient connected to nasal cannula oxygen  Post-op Assessment: Report given to RN and Post -op Vital signs reviewed and stable  Post vital signs: Reviewed and stable  Last Vitals:  Vitals:   09/22/16 1307  BP: (!) 167/86  Pulse: 96  Resp: 12  Temp: 36.6 C    Last Pain:  Vitals:   09/22/16 1307  TempSrc: Oral         Complications: No apparent anesthesia complications

## 2016-09-23 ENCOUNTER — Encounter (HOSPITAL_COMMUNITY): Payer: Self-pay | Admitting: Gastroenterology

## 2016-09-27 ENCOUNTER — Ambulatory Visit: Payer: Medicare Other | Admitting: Internal Medicine

## 2016-10-18 ENCOUNTER — Ambulatory Visit: Payer: Medicare Other | Admitting: Internal Medicine

## 2016-10-26 ENCOUNTER — Ambulatory Visit: Payer: Medicare Other | Admitting: Internal Medicine

## 2016-11-09 ENCOUNTER — Ambulatory Visit: Payer: Medicare Other | Admitting: Internal Medicine

## 2016-11-22 ENCOUNTER — Encounter: Payer: Self-pay | Admitting: Internal Medicine

## 2016-11-22 ENCOUNTER — Ambulatory Visit (INDEPENDENT_AMBULATORY_CARE_PROVIDER_SITE_OTHER): Payer: Medicare Other | Admitting: Internal Medicine

## 2016-11-22 DIAGNOSIS — F331 Major depressive disorder, recurrent, moderate: Secondary | ICD-10-CM | POA: Diagnosis not present

## 2016-11-22 DIAGNOSIS — E785 Hyperlipidemia, unspecified: Secondary | ICD-10-CM | POA: Diagnosis not present

## 2016-11-22 DIAGNOSIS — J449 Chronic obstructive pulmonary disease, unspecified: Secondary | ICD-10-CM

## 2016-11-22 DIAGNOSIS — F17200 Nicotine dependence, unspecified, uncomplicated: Secondary | ICD-10-CM | POA: Diagnosis not present

## 2016-11-22 MED ORDER — FLUTICASONE-UMECLIDIN-VILANT 100-62.5-25 MCG/INH IN AEPB: 1.0000 | INHALATION_SPRAY | Freq: Every day | RESPIRATORY_TRACT | 11 refills | Status: DC

## 2016-11-22 MED ORDER — B COMPLEX PO TABS: 1.0000 | ORAL_TABLET | Freq: Every day | ORAL | 3 refills | Status: AC

## 2016-11-22 MED ORDER — TEMAZEPAM 15 MG PO CAPS: ORAL_CAPSULE | ORAL | 1 refills | Status: DC

## 2016-11-22 MED ORDER — ESCITALOPRAM OXALATE 5 MG PO TABS: 5.0000 mg | ORAL_TABLET | Freq: Every day | ORAL | 5 refills | Status: DC

## 2016-11-22 NOTE — Assessment & Plan Note (Signed)
Worse Re-start Lexapro - low dose

## 2016-11-22 NOTE — Assessment & Plan Note (Addendum)
Advair assistance program discussed Trelegy to try instead - sample/Rx

## 2016-11-22 NOTE — Progress Notes (Signed)
Pre-visit discussion using our clinic review tool. No additional management support is needed unless otherwise documented below in the visit note.  

## 2016-11-22 NOTE — Assessment & Plan Note (Signed)
Discussed.

## 2016-11-22 NOTE — Assessment & Plan Note (Signed)
Declined labs

## 2016-11-22 NOTE — Progress Notes (Signed)
Subjective:  Patient ID: Theresa Aguirre, female    DOB: 22-Apr-1946  Age: 71 y.o. MRN: [id number]  CC: Shortness of Breath (weight gain, ); Depression (unsure what to do, nothing seems to be working); and Dysphagia (EGD don 09/2016 difficult going through passage per patient states)   HPI Karalee Lesinski presents for depression - worse, HTN, COPD f/u. C/o apathy Lexapro helped - she was c/o feeling tired and sleepy on it when she was working  Outpatient Medications Prior to Visit  Medication Sig Dispense Refill  . albuterol (PROVENTIL HFA;VENTOLIN HFA) 108 (90 Base) MCG/ACT inhaler Inhale 2 puffs into the lungs every 6 (six) hours as needed. 1 Inhaler 3  . ALPRAZolam (XANAX) 1 MG tablet TAKE 1 TABLET BY MOUTH FOUR TIMES DAILY AS NEEDED FOR SLEEP OR ANXIETY 360 tablet 1  . aspirin (BAYER ASPIRIN) 325 MG tablet Take 0.5 tablets (162 mg total) by mouth daily. 100 tablet 3  . clobetasol cream (TEMOVATE) 0.05 % APPLY TO AFFECTED AREA BID  3  . fluticasone (FLONASE) 50 MCG/ACT nasal spray Place 2 sprays into both nostrils daily. 16 g 6  . Fluticasone-Salmeterol (ADVAIR DISKUS) 250-50 MCG/DOSE AEPB Inhale 1 puff into the lungs 2 (two) times daily. 1 each 11  . hydrochlorothiazide (MICROZIDE) 12.5 MG capsule TAKE ONE CAPSULE BY MOUTH DAILY 30 capsule 5  . lipase/protease/amylase (CREON) 36000 UNITS CPEP capsule Take 1 capsule (36,000 Units total) by mouth 3 (three) times daily before meals. 90 capsule 11  . pantoprazole (PROTONIX) 40 MG tablet Take 1 tablet (40 mg total) by mouth daily. 90 tablet 3   Facility-Administered Medications Prior to Visit  Medication Dose Route Frequency Provider Last Rate Last Dose  . 0.9 %  sodium chloride infusion  500 mL Intravenous Continuous Irene Shipper, MD        ROS Review of Systems  Constitutional: Positive for fatigue. Negative for activity change, appetite change, chills and unexpected weight change.  HENT: Negative for congestion, mouth sores and sinus  pressure.   Eyes: Negative for visual disturbance.  Respiratory: Positive for cough and shortness of breath. Negative for chest tightness.   Gastrointestinal: Negative for abdominal pain and nausea.  Genitourinary: Negative for difficulty urinating, frequency and vaginal pain.  Musculoskeletal: Negative for back pain and gait problem.  Skin: Negative for pallor and rash.  Neurological: Negative for dizziness, tremors, weakness, numbness and headaches.  Psychiatric/Behavioral: Positive for dysphoric mood and sleep disturbance. Negative for confusion and suicidal ideas. The patient is nervous/anxious.     Objective:  BP (!) 146/90   Pulse 84   Temp 97.8 F (36.6 C) (Oral)   Resp 16   Ht 5\' 3"  (1.6 m)   Wt 162 lb 4 oz (73.6 kg)   SpO2 98%   BMI 28.74 kg/m   BP Readings from Last 3 Encounters:  11/22/16 (!) 146/90  09/22/16 (!) 147/90  09/05/16 (!) 150/82    Wt Readings from Last 3 Encounters:  11/22/16 162 lb 4 oz (73.6 kg)  09/22/16 159 lb (72.1 kg)  09/05/16 159 lb (72.1 kg)    Physical Exam  Constitutional: She appears well-developed. No distress.  HENT:  Head: Normocephalic.  Right Ear: External ear normal.  Left Ear: External ear normal.  Nose: Nose normal.  Mouth/Throat: Oropharynx is clear and moist.  Eyes: Conjunctivae are normal. Pupils are equal, round, and reactive to light. Right eye exhibits no discharge. Left eye exhibits no discharge.  Neck: Normal range of motion. Neck supple.  No JVD present. No tracheal deviation present. No thyromegaly present.  Cardiovascular: Normal rate, regular rhythm and normal heart sounds.   Pulmonary/Chest: No stridor. No respiratory distress. She has no wheezes.  Abdominal: Soft. Bowel sounds are normal. She exhibits no distension and no mass. There is no tenderness. There is no rebound and no guarding.  Musculoskeletal: She exhibits tenderness. She exhibits no edema.  Lymphadenopathy:    She has no cervical adenopathy.    Neurological: She displays normal reflexes. No cranial nerve deficit. She exhibits normal muscle tone. Coordination normal.  Skin: No rash noted. No erythema.  Psychiatric: She has a normal mood and affect. Her behavior is normal. Judgment and thought content normal.    Lab Results  Component Value Date   WBC 7.2 09/29/2015   HGB 15.5 (H) 09/29/2015   HCT 46.9 (H) 09/29/2015   PLT 176.0 09/29/2015   GLUCOSE 112 (H) 06/24/2016   CHOL 199 05/01/2015   TRIG 92.0 05/01/2015   HDL 42.00 05/01/2015   LDLDIRECT 162.8 06/28/2011   LDLCALC 139 (H) 05/01/2015   ALT 29 06/24/2016   AST 38 (H) 06/24/2016   NA 143 06/24/2016   K 4.5 06/24/2016   CL 104 06/24/2016   CREATININE 0.80 06/24/2016   BUN 11 06/24/2016   CO2 33 (H) 06/24/2016   TSH 1.52 09/29/2015   HGBA1C 5.6 01/02/2008    No results found.  Assessment & Plan:   There are no diagnoses linked to this encounter. I am having Ms. Lasser maintain her fluticasone, lipase/protease/amylase, aspirin, clobetasol cream, hydrochlorothiazide, albuterol, Fluticasone-Salmeterol, pantoprazole, and ALPRAZolam. We will continue to administer sodium chloride.  No orders of the defined types were placed in this encounter.    Follow-up: No Follow-up on file.  Walker Kehr, MD

## 2017-01-05 ENCOUNTER — Telehealth: Payer: Self-pay | Admitting: Internal Medicine

## 2017-01-05 NOTE — Telephone Encounter (Signed)
Attempted to call patient to schedule awv. Called number listed on chart, but was told incorrect number was called.

## 2017-01-09 ENCOUNTER — Encounter: Payer: Self-pay | Admitting: Internal Medicine

## 2017-01-09 ENCOUNTER — Ambulatory Visit (INDEPENDENT_AMBULATORY_CARE_PROVIDER_SITE_OTHER): Payer: Medicare Other | Admitting: Internal Medicine

## 2017-01-09 DIAGNOSIS — F17200 Nicotine dependence, unspecified, uncomplicated: Secondary | ICD-10-CM | POA: Diagnosis not present

## 2017-01-09 DIAGNOSIS — F331 Major depressive disorder, recurrent, moderate: Secondary | ICD-10-CM | POA: Diagnosis not present

## 2017-01-09 DIAGNOSIS — J449 Chronic obstructive pulmonary disease, unspecified: Secondary | ICD-10-CM | POA: Diagnosis not present

## 2017-01-09 DIAGNOSIS — F4321 Adjustment disorder with depressed mood: Secondary | ICD-10-CM | POA: Diagnosis not present

## 2017-01-09 DIAGNOSIS — Z634 Disappearance and death of family member: Secondary | ICD-10-CM | POA: Diagnosis not present

## 2017-01-09 MED ORDER — ROFLUMILAST 500 MCG PO TABS: 500.0000 ug | ORAL_TABLET | Freq: Every day | ORAL | 5 refills | Status: DC

## 2017-01-09 MED ORDER — BUDESONIDE-FORMOTEROL FUMARATE 160-4.5 MCG/ACT IN AERO: 2.0000 | INHALATION_SPRAY | Freq: Two times a day (BID) | RESPIRATORY_TRACT | 6 refills | Status: DC

## 2017-01-09 NOTE — Progress Notes (Signed)
Subjective:  Patient ID: Theresa Aguirre, female    DOB: 06-Jul-1946  Age: 71 y.o. MRN: [id number]  CC: Asthma (follow up)   HPI Theresa Aguirre presents for depression, COPD/asthma. She was doing netter on Advair and Daliresp C/o anxiety - better  Outpatient Medications Prior to Visit  Medication Sig Dispense Refill  . albuterol (PROVENTIL HFA;VENTOLIN HFA) 108 (90 Base) MCG/ACT inhaler Inhale 2 puffs into the lungs every 6 (six) hours as needed. 1 Inhaler 3  . ALPRAZolam (XANAX) 1 MG tablet TAKE 1 TABLET BY MOUTH FOUR TIMES DAILY AS NEEDED FOR SLEEP OR ANXIETY 360 tablet 1  . aspirin (BAYER ASPIRIN) 325 MG tablet Take 0.5 tablets (162 mg total) by mouth daily. 100 tablet 3  . b complex vitamins tablet Take 1 tablet by mouth daily. 100 tablet 3  . clobetasol cream (TEMOVATE) 0.05 % APPLY TO AFFECTED AREA BID  3  . escitalopram (LEXAPRO) 5 MG tablet Take 1 tablet (5 mg total) by mouth daily. 30 tablet 5  . fluticasone (FLONASE) 50 MCG/ACT nasal spray Place 2 sprays into both nostrils daily. 16 g 6  . Fluticasone-Salmeterol (ADVAIR DISKUS) 250-50 MCG/DOSE AEPB Inhale 1 puff into the lungs 2 (two) times daily. 1 each 11  . Fluticasone-Umeclidin-Vilant (TRELEGY ELLIPTA) 100-62.5-25 MCG/INH AEPB Inhale 1 puff into the lungs daily. 1 each 11  . hydrochlorothiazide (MICROZIDE) 12.5 MG capsule TAKE ONE CAPSULE BY MOUTH DAILY 30 capsule 5  . lipase/protease/amylase (CREON) 36000 UNITS CPEP capsule Take 1 capsule (36,000 Units total) by mouth 3 (three) times daily before meals. 90 capsule 11  . pantoprazole (PROTONIX) 40 MG tablet Take 1 tablet (40 mg total) by mouth daily. 90 tablet 3  . temazepam (RESTORIL) 15 MG capsule TAKE 1 TO 2 CAPSULES BY MOUTH EVERY NIGHT AT BEDTIME AS NEEDED FOR INSOMNIA 180 capsule 1   Facility-Administered Medications Prior to Visit  Medication Dose Route Frequency Provider Last Rate Last Dose  . 0.9 %  sodium chloride infusion  500 mL Intravenous Continuous Irene Shipper, MD        ROS Review of Systems  Constitutional: Negative for activity change, appetite change, chills, fatigue and unexpected weight change.  HENT: Negative for congestion, mouth sores and sinus pressure.   Eyes: Negative for visual disturbance.  Respiratory: Positive for wheezing. Negative for cough and chest tightness.   Gastrointestinal: Negative for abdominal pain and nausea.  Genitourinary: Negative for difficulty urinating, frequency and vaginal pain.  Musculoskeletal: Negative for back pain and gait problem.  Skin: Negative for pallor and rash.  Neurological: Negative for dizziness, tremors, weakness, numbness and headaches.  Psychiatric/Behavioral: Negative for confusion, sleep disturbance and suicidal ideas.    Objective:  BP 136/88   Pulse 78   Temp 97.6 F (36.4 C)   Ht 5' 1.5" (1.562 m)   Wt 158 lb (71.7 kg)   SpO2 99%   BMI 29.37 kg/m   BP Readings from Last 3 Encounters:  01/09/17 136/88  11/22/16 (!) 146/90  09/22/16 (!) 147/90    Wt Readings from Last 3 Encounters:  01/09/17 158 lb (71.7 kg)  11/22/16 162 lb 4 oz (73.6 kg)  09/22/16 159 lb (72.1 kg)    Physical Exam  Constitutional: She appears well-developed. No distress.  HENT:  Head: Normocephalic.  Right Ear: External ear normal.  Left Ear: External ear normal.  Nose: Nose normal.  Mouth/Throat: Oropharynx is clear and moist.  Eyes: Conjunctivae are normal. Pupils are equal, round, and reactive to  light. Right eye exhibits no discharge. Left eye exhibits no discharge.  Neck: Normal range of motion. Neck supple. No JVD present. No tracheal deviation present. No thyromegaly present.  Cardiovascular: Normal rate, regular rhythm and normal heart sounds.   Pulmonary/Chest: No stridor. No respiratory distress. She has wheezes.  Abdominal: Soft. Bowel sounds are normal. She exhibits no distension and no mass. There is no tenderness. There is no rebound and no guarding.  Musculoskeletal: She  exhibits tenderness. She exhibits no edema.  Lymphadenopathy:    She has no cervical adenopathy.  Neurological: She displays normal reflexes. No cranial nerve deficit. She exhibits normal muscle tone. Coordination normal.  Skin: No rash noted. No erythema.  Psychiatric: Her behavior is normal. Judgment and thought content normal.    Lab Results  Component Value Date   WBC 7.2 09/29/2015   HGB 15.5 (H) 09/29/2015   HCT 46.9 (H) 09/29/2015   PLT 176.0 09/29/2015   GLUCOSE 112 (H) 06/24/2016   CHOL 199 05/01/2015   TRIG 92.0 05/01/2015   HDL 42.00 05/01/2015   LDLDIRECT 162.8 06/28/2011   LDLCALC 139 (H) 05/01/2015   ALT 29 06/24/2016   AST 38 (H) 06/24/2016   NA 143 06/24/2016   K 4.5 06/24/2016   CL 104 06/24/2016   CREATININE 0.80 06/24/2016   BUN 11 06/24/2016   CO2 33 (H) 06/24/2016   TSH 1.52 09/29/2015   HGBA1C 5.6 01/02/2008    No results found.  Assessment & Plan:   There are no diagnoses linked to this encounter. I am having Ms. Machorro maintain her fluticasone, lipase/protease/amylase, aspirin, clobetasol cream, hydrochlorothiazide, albuterol, Fluticasone-Salmeterol, pantoprazole, ALPRAZolam, temazepam, escitalopram, b complex vitamins, and Fluticasone-Umeclidin-Vilant. We will continue to administer sodium chloride.  No orders of the defined types were placed in this encounter.    Follow-up: No Follow-up on file.  Walker Kehr, MD

## 2017-01-09 NOTE — Assessment & Plan Note (Signed)
Discussed smoking 

## 2017-01-09 NOTE — Assessment & Plan Note (Signed)
She was doing netter on Advair and Daliresp - will try again

## 2017-01-09 NOTE — Assessment & Plan Note (Signed)
On Lexapro 

## 2017-01-09 NOTE — Assessment & Plan Note (Signed)
Discussed.

## 2017-02-27 ENCOUNTER — Ambulatory Visit (INDEPENDENT_AMBULATORY_CARE_PROVIDER_SITE_OTHER): Payer: Medicare Other | Admitting: Internal Medicine

## 2017-02-27 ENCOUNTER — Encounter: Payer: Self-pay | Admitting: Internal Medicine

## 2017-02-27 DIAGNOSIS — R030 Elevated blood-pressure reading, without diagnosis of hypertension: Secondary | ICD-10-CM | POA: Diagnosis not present

## 2017-02-27 DIAGNOSIS — J449 Chronic obstructive pulmonary disease, unspecified: Secondary | ICD-10-CM

## 2017-02-27 DIAGNOSIS — K2100 Gastro-esophageal reflux disease with esophagitis, without bleeding: Secondary | ICD-10-CM

## 2017-02-27 DIAGNOSIS — F4323 Adjustment disorder with mixed anxiety and depressed mood: Secondary | ICD-10-CM | POA: Diagnosis not present

## 2017-02-27 MED ORDER — ESCITALOPRAM OXALATE 10 MG PO TABS: 10.0000 mg | ORAL_TABLET | Freq: Every day | ORAL | 11 refills | Status: DC

## 2017-02-27 NOTE — Progress Notes (Signed)
Subjective:  Patient ID: Theresa Aguirre, female    DOB: [birth year]  Age: 71 y.o. MRN: [id number]  CC: No chief complaint on file.   HPI Menaal Russum presents for COPD, depression The pt took Daliresp - had side effects, doing well now Smoking 7 cigs a day...  Outpatient Medications Prior to Visit  Medication Sig Dispense Refill  . albuterol (PROVENTIL HFA;VENTOLIN HFA) 108 (90 Base) MCG/ACT inhaler Inhale 2 puffs into the lungs every 6 (six) hours as needed. 1 Inhaler 3  . ALPRAZolam (XANAX) 1 MG tablet TAKE 1 TABLET BY MOUTH FOUR TIMES DAILY AS NEEDED FOR SLEEP OR ANXIETY 360 tablet 1  . aspirin (BAYER ASPIRIN) 325 MG tablet Take 0.5 tablets (162 mg total) by mouth daily. 100 tablet 3  . b complex vitamins tablet Take 1 tablet by mouth daily. 100 tablet 3  . clobetasol cream (TEMOVATE) 0.05 % APPLY TO AFFECTED AREA BID  3  . escitalopram (LEXAPRO) 5 MG tablet Take 1 tablet (5 mg total) by mouth daily. 30 tablet 5  . fluticasone (FLONASE) 50 MCG/ACT nasal spray Place 2 sprays into both nostrils daily. 16 g 6  . Fluticasone-Salmeterol (ADVAIR DISKUS) 250-50 MCG/DOSE AEPB Inhale 1 puff into the lungs 2 (two) times daily. 1 each 11  . hydrochlorothiazide (MICROZIDE) 12.5 MG capsule TAKE ONE CAPSULE BY MOUTH DAILY 30 capsule 5  . lipase/protease/amylase (CREON) 36000 UNITS CPEP capsule Take 1 capsule (36,000 Units total) by mouth 3 (three) times daily before meals. 90 capsule 11  . pantoprazole (PROTONIX) 40 MG tablet Take 1 tablet (40 mg total) by mouth daily. 90 tablet 3  . roflumilast (DALIRESP) 500 MCG TABS tablet Take 1 tablet (500 mcg total) by mouth daily. 30 tablet 5  . temazepam (RESTORIL) 15 MG capsule TAKE 1 TO 2 CAPSULES BY MOUTH EVERY NIGHT AT BEDTIME AS NEEDED FOR INSOMNIA 180 capsule 1   Facility-Administered Medications Prior to Visit  Medication Dose Route Frequency Provider Last Rate Last Dose  . 0.9 %  sodium chloride infusion  500 mL Intravenous Continuous Irene Shipper, MD        ROS Review of Systems  Constitutional: Negative for activity change, appetite change, chills, fatigue and unexpected weight change.  HENT: Negative for congestion, mouth sores and sinus pressure.   Eyes: Negative for visual disturbance.  Respiratory: Negative for cough and chest tightness.   Gastrointestinal: Negative for abdominal pain and nausea.  Genitourinary: Negative for difficulty urinating, frequency and vaginal pain.  Musculoskeletal: Negative for back pain and gait problem.  Skin: Negative for pallor and rash.  Neurological: Negative for dizziness, tremors, weakness, numbness and headaches.  Psychiatric/Behavioral: Negative for confusion, sleep disturbance and suicidal ideas. The patient is nervous/anxious.     Objective:  BP 126/76 (BP Location: Left Arm, Patient Position: Sitting, Cuff Size: Normal)   Pulse 78   Temp 97.9 F (36.6 C) (Oral)   Ht 5' 1.5" (1.562 m)   Wt 151 lb 1.3 oz (68.5 kg)   SpO2 98%   BMI 28.08 kg/m   BP Readings from Last 3 Encounters:  02/27/17 126/76  01/09/17 136/88  11/22/16 (!) 146/90    Wt Readings from Last 3 Encounters:  02/27/17 151 lb 1.3 oz (68.5 kg)  01/09/17 158 lb (71.7 kg)  11/22/16 162 lb 4 oz (73.6 kg)    Physical Exam  Constitutional: She appears well-developed. No distress.  HENT:  Head: Normocephalic.  Right Ear: External ear normal.  Left Ear: External ear  normal.  Nose: Nose normal.  Mouth/Throat: Oropharynx is clear and moist.  Eyes: Conjunctivae are normal. Pupils are equal, round, and reactive to light. Right eye exhibits no discharge. Left eye exhibits no discharge.  Neck: Normal range of motion. Neck supple. No JVD present. No tracheal deviation present. No thyromegaly present.  Cardiovascular: Normal rate, regular rhythm and normal heart sounds.   Pulmonary/Chest: No stridor. No respiratory distress. She has wheezes.  Abdominal: Soft. Bowel sounds are normal. She exhibits no distension and no  mass. There is no tenderness. There is no rebound and no guarding.  Musculoskeletal: She exhibits no edema or tenderness.  Lymphadenopathy:    She has no cervical adenopathy.  Neurological: She displays normal reflexes. No cranial nerve deficit. She exhibits normal muscle tone. Coordination normal.  Skin: No rash noted. No erythema.  Psychiatric: She has a normal mood and affect. Her behavior is normal. Judgment and thought content normal.    Lab Results  Component Value Date   WBC 7.2 09/29/2015   HGB 15.5 (H) 09/29/2015   HCT 46.9 (H) 09/29/2015   PLT 176.0 09/29/2015   GLUCOSE 112 (H) 06/24/2016   CHOL 199 05/01/2015   TRIG 92.0 05/01/2015   HDL 42.00 05/01/2015   LDLDIRECT 162.8 06/28/2011   LDLCALC 139 (H) 05/01/2015   ALT 29 06/24/2016   AST 38 (H) 06/24/2016   NA 143 06/24/2016   K 4.5 06/24/2016   CL 104 06/24/2016   CREATININE 0.80 06/24/2016   BUN 11 06/24/2016   CO2 33 (H) 06/24/2016   TSH 1.52 09/29/2015   HGBA1C 5.6 01/02/2008    No results found.  Assessment & Plan:   There are no diagnoses linked to this encounter. I am having Ms. Hickling maintain her fluticasone, lipase/protease/amylase, aspirin, clobetasol cream, hydrochlorothiazide, albuterol, Fluticasone-Salmeterol, pantoprazole, ALPRAZolam, temazepam, escitalopram, b complex vitamins, and roflumilast. We will continue to administer sodium chloride.  No orders of the defined types were placed in this encounter.    Follow-up: No Follow-up on file.  Walker Kehr, MD

## 2017-02-27 NOTE — Patient Instructions (Signed)
??????????? ????????????? ??????????? ?????? (Chronic Obstructive Pulmonary Disease) ??????????? ????????????? ??????????? ?????? (????) - ????? ????????????? ?????????, ??? ??????? ????????? ????? ??????? ?? ??????. ???? ???????? ????? ????????, ??????? ????? ???? ??????????? ??? ???????? ????????? ????????????? ???????????, ???????????????? ???????????? ?????? ???????, ? ??? ????? ???????????? ???????? ? ????????. ???? ?? ????????? ????, ??????? ?????? ? ???, ????????, ??????? ?? ???????? ? ?????, ?? ?????????? ????, ? ??????? ??????? ????? ???????? ??????? ?????? ? ??????????? ???? ?????. ???????  ??????? (?????? ???????).  ????????? ???????.  ???????????? ???????????????????.  ??????????? ?????????????? ??????????? ?????? ??? ?????????????? ????????. ????????  ??????, ???????? ??? ?????????? ????????.  ????????, ?????????? (???????????) ??????, ??? ??????? ????????????? ???????????? ????? ?????? ????? (???????).  ????????? ???????.  ????????? ??????? (????????).  ????????? ????? ???? ?? ????? ??? ????????? (??????), ???????? ?? ??????? ???, ??? ??? ? ??????? ???.  ?????????? ????????????.  ???????? ????.  ?????? ???????? ??? ???????, ????? ???????? ?? ??????? ??????????? ??????? ?????????? ??????? ???? (??????????).  ???????? ???????????? ? ?????. ??????? ?????????????? ??????? ??????? ???? ??????? ??????? ? ???????? ??????????? ??????. ?????????????? ???????????? ??? ????????????? ???? ????? ???????????? ?????:  ?????????????? (??????????????????) ???????????? ??????.  ?????????????? ??????? ??????? ??????.  ??-???????????? (???????????? ??????????).  ??????? ?????. ??????? ??????? ???? ????? ???????? ?????????:  ????? ???????? ? ??????? ?????????? ? ???????????. ??? ???????? ?????????????? ???????? ???? ? ?????? ??????? ????? ??????????.  ?????????????? ????????. ?????????????? ???????? ??????? ?????? ? ??????, ???? ? ??? ?????? ??????? ????????? ?  ?????.  ?????????? ??????????. ??? ???????? ?????? ????? ? ???? ??????? ????.  ????????????? ????????????? ???????? ??? ??????????????.  ????????????, ????? ??????? ???, ???? ? ??? ????????????? ???.  ???????? ????????????. ??? ????? ???????? ?????? ? ???????? ?????? ? ?????????????????? ???????????????, ????????, ?????????????, ???????????? ???????????????? ??????? ? ???????????????. ?????????? ?? ????? ? ???????? ????????  ?????????? ??? ????????????? ????????? (???????????? ??? ? ?????????) ? ???????????? ? ?????????? ?????.  ?? ?????????? ?????????????? ????????????? ?????????? ??? ??????? ?? ?????, ??????? ???????? ? ??????????? ??????????? ????? (??????????????? ?????????) ? ????????? ???????? ???????, ???? ?????? ???? ?? ??????? ????? ??????.  ???? ?? ??????, ????? ???????, ??? ?? ?????? ???????, ??? ??????? ??????. ?????????????? ??????????? ???? ??????? ?????????? ??????????? ?????? ? ??????????? ???????? ???????. ????????? ???????? ????? ?????? ??? ??????? ??????. ?? ????? ?????????? ??? ? ???????????? ???????? ??? ????????? ? ????????, ??? ????????? ????????? [????????? ??????].  ????????? ??????????? ?????????????, ????? ??? ???????? ??? ? ???? ?????????? ??????????, ??????? ????? ????????? ???????? ???????.  ??????????? ??????????? ??????? ? ?????????? ? ????????? ????????????, ???? ??? ????????? ????? ??????. ???? ??? ????????? ??????????? ??????? ? ???????? ????????, ???????? ?????? ?????, ??????? ?? ??? ?????????? ?????????????, ????? ???? ???????? ??????? ????????? [? ?????].  ????????? ????????? ? ??????, ???????? ????????? ?????????????.  ????????? ?????? ????????? ??????????? ? ?????????.  ????????? ?????????. ?????? ??????? ?? ????? ??????, ?? ?? ????????? ?? ?????? ??????? ????, ? ????? ????????? ????? ????? ???????? ????, ???????? ??? ???????????? ????.  ??????? ???????? ????? ????????? ?????????? ????????, ?? ?????????? ??????????. ?????????? ???????? ? ??????????  ??????? ??? ???????????? ???? ?????? ??, ??? ?? ??????.  ???? ?? ?????? ????, ????? ????? ?????????????? ???????? ? ??????????????. ???????? ??? ??????????, ??????? ??????????? ??? ??????? ????, ???????? ? ????????? ?????????????? ??????? ? ??????? ?????? ??????. ???????? ?????? ?????, ????? ?? ??? ?????????? ?? ?????????.  ??????? ? ?????????? ??????? ?????????? ??? ?????????? ????????.  ??????? ? ?????????? ???????? ??????????????? ??????? ? ???????????? ? ???????????? ?????. ???????? ??????????????? ??????? ????????:  ??????? ????? ????????? ????. ??????? ? ???????? ????? ??? ?????????????????? 1 ??????? (????). ????? ??????? ????, ??? ????? ?? ??????????? ????????, ? ???????? ????? ????? ?????? ???? ? ??????? 2 ?????? (?????).  ?????????????? ???????. ???????? ?????? ?? ????? ???? ?????. ???????? ???????? ????? ???. ???? ?? ?????? ?????? ????????? ??????. ????? ??????? ???? [??? ??????? ????] ? ???????? ?????????. ????? ?? ?????????, ???? ?? ?????? ?????? ????????? ??????.  ??????? ? ?????????? ?????????????? ??????, ????? ??????? ??????? ?? ??????. ?????????????? ?????? - ??? ????? ????????, ??????????? ????????????. ??????? ??????????????? ?????:  1. ??????? ????????? ?????? ??????. 2. ??????? ????????, ????????? ??????? ??????? ??????????. 3. ???????????? ????????? ??????? ?? 3 ???????. 4. ??????? ?????? ???, ????????? ??? ????. 5. ???????? ??????? ? ?????? 6. ????????? ? ????????? ??? ???????? ??? 1-2 ????. ?????????? ? ?????, ????:  ?? ???????????? ??????? ??????, ??? ??????.  ???? ? ??????? ??????? ??????????.  ??????? ????? ????? ????????????.  ??????? ????? ????? ??????. ?????????? ?????????? ? ?????, ????:  ? ??? ????????? ?????? ? ?????.  ? ??? ???????? ??????, ??-?? ??????? ?? ?? ??????:  ????????.  ????????? ??????? ?????????? ????????.  ? ??? ????????? ???? ? ?????, ??????? ???????????? ?????? 5 ?????.  ???? ???? ???? ????? ????????.  ?????????  ??????????????? ?????????? ?????? ??????? ???????????? [?????] ?????????? ????? 5 ?????. ?????????, ??? ??:  ????????? ?????? ??????????.  ?????? ?????????????? ??????? ?? ????? ??????????.  ??????????????? ?????????? ? ?????, ???? ??? ?? ?????????? ????? ??? ?????????? ????. ??? ?????????? ?? ????? ???????? ??????, ??????????????? ????? ??????. ??????????? ???????? ????? ???????????? ??? ??????? ? ????? ??????? ??????. Document Released: 07/06/2005 Document Revised: 10/17/2014 Document Reviewed: 05/23/2013 Elsevier Interactive Patient Education  2017 [employer].

## 2017-02-27 NOTE — Assessment & Plan Note (Addendum)
On Lexapro Xanax prn

## 2017-02-27 NOTE — Assessment & Plan Note (Signed)
Advair Proventil On Daliresp Trying to stop smoking

## 2017-02-27 NOTE — Assessment & Plan Note (Signed)
Protonix po 

## 2017-02-27 NOTE — Assessment & Plan Note (Signed)
BP Readings from Last 3 Encounters:  02/27/17 126/76  01/09/17 136/88  11/22/16 (!) 146/90

## 2017-03-09 ENCOUNTER — Other Ambulatory Visit: Payer: Self-pay | Admitting: Internal Medicine

## 2017-05-22 ENCOUNTER — Ambulatory Visit: Payer: Medicare Other | Admitting: Internal Medicine

## 2017-06-02 ENCOUNTER — Encounter: Payer: Self-pay | Admitting: Internal Medicine

## 2017-06-02 ENCOUNTER — Other Ambulatory Visit (INDEPENDENT_AMBULATORY_CARE_PROVIDER_SITE_OTHER): Payer: Medicare Other

## 2017-06-02 ENCOUNTER — Ambulatory Visit (INDEPENDENT_AMBULATORY_CARE_PROVIDER_SITE_OTHER): Payer: Medicare Other | Admitting: Internal Medicine

## 2017-06-02 VITALS — BP 122/80 | HR 91 | Temp 98.0°F | Ht 61.5 in | Wt 142.0 lb

## 2017-06-02 DIAGNOSIS — J449 Chronic obstructive pulmonary disease, unspecified: Secondary | ICD-10-CM

## 2017-06-02 DIAGNOSIS — F4323 Adjustment disorder with mixed anxiety and depressed mood: Secondary | ICD-10-CM

## 2017-06-02 DIAGNOSIS — F17200 Nicotine dependence, unspecified, uncomplicated: Secondary | ICD-10-CM | POA: Diagnosis not present

## 2017-06-02 DIAGNOSIS — R0602 Shortness of breath: Secondary | ICD-10-CM | POA: Diagnosis not present

## 2017-06-02 DIAGNOSIS — E785 Hyperlipidemia, unspecified: Secondary | ICD-10-CM

## 2017-06-02 DIAGNOSIS — K2100 Gastro-esophageal reflux disease with esophagitis, without bleeding: Secondary | ICD-10-CM

## 2017-06-02 DIAGNOSIS — Z23 Encounter for immunization: Secondary | ICD-10-CM

## 2017-06-02 LAB — URINALYSIS
Bilirubin Urine: NEGATIVE
Hgb urine dipstick: NEGATIVE
KETONES UR: NEGATIVE
Leukocytes, UA: NEGATIVE
Nitrite: NEGATIVE
PH: 6 (ref 5.0–8.0)
TOTAL PROTEIN, URINE-UPE24: NEGATIVE
URINE GLUCOSE: NEGATIVE
Urobilinogen, UA: 0.2 (ref 0.0–1.0)

## 2017-06-02 LAB — CBC WITH DIFFERENTIAL/PLATELET
Basophils Absolute: 0 10*3/uL (ref 0.0–0.1)
Basophils Relative: 0.5 % (ref 0.0–3.0)
EOS ABS: 0.3 10*3/uL (ref 0.0–0.7)
Eosinophils Relative: 4 % (ref 0.0–5.0)
HCT: 44.8 % (ref 36.0–46.0)
Hemoglobin: 14.6 g/dL (ref 12.0–15.0)
LYMPHS ABS: 2.5 10*3/uL (ref 0.7–4.0)
LYMPHS PCT: 32.9 % (ref 12.0–46.0)
MCHC: 32.7 g/dL (ref 30.0–36.0)
MCV: 82.7 fl (ref 78.0–100.0)
MONOS PCT: 8.7 % (ref 3.0–12.0)
Monocytes Absolute: 0.6 10*3/uL (ref 0.1–1.0)
NEUTROS ABS: 4 10*3/uL (ref 1.4–7.7)
NEUTROS PCT: 53.9 % (ref 43.0–77.0)
PLATELETS: 210 10*3/uL (ref 150.0–400.0)
RBC: 5.42 Mil/uL — AB (ref 3.87–5.11)
RDW: 15.3 % (ref 11.5–15.5)
WBC: 7.5 10*3/uL (ref 4.0–10.5)

## 2017-06-02 LAB — HEPATIC FUNCTION PANEL
ALK PHOS: 76 U/L (ref 39–117)
ALT: 17 U/L (ref 0–35)
AST: 28 U/L (ref 0–37)
Albumin: 4.5 g/dL (ref 3.5–5.2)
BILIRUBIN DIRECT: 0.2 mg/dL (ref 0.0–0.3)
Total Bilirubin: 0.7 mg/dL (ref 0.2–1.2)
Total Protein: 7.7 g/dL (ref 6.0–8.3)

## 2017-06-02 LAB — BASIC METABOLIC PANEL
BUN: 12 mg/dL (ref 6–23)
CO2: 32 mEq/L (ref 19–32)
Calcium: 10.5 mg/dL (ref 8.4–10.5)
Chloride: 102 mEq/L (ref 96–112)
Creatinine, Ser: 0.75 mg/dL (ref 0.40–1.20)
GFR: 80.78 mL/min (ref >60.00)
Glucose, Bld: 122 mg/dL — ABNORMAL HIGH (ref 70–99)
POTASSIUM: 4.1 meq/L (ref 3.5–5.1)
Sodium: 140 mEq/L (ref 135–145)

## 2017-06-02 LAB — LIPID PANEL
CHOL/HDL RATIO: 6
Cholesterol: 243 mg/dL — ABNORMAL HIGH (ref 0–200)
HDL: 42.8 mg/dL (ref >39.00)
LDL CALC: 177 mg/dL — AB (ref 0–99)
NonHDL: 200.46
Triglycerides: 119 mg/dL (ref 0.0–149.0)
VLDL: 23.8 mg/dL (ref 0.0–40.0)

## 2017-06-02 LAB — TSH: TSH: 1.9 u[IU]/mL (ref 0.35–4.50)

## 2017-06-02 MED ORDER — ALPRAZOLAM 1 MG PO TABS: ORAL_TABLET | ORAL | 1 refills | Status: DC

## 2017-06-02 NOTE — Assessment & Plan Note (Signed)
Pt stopped Lexapro 

## 2017-06-02 NOTE — Progress Notes (Signed)
Subjective:  Patient ID: Theresa Aguirre, female    DOB: [birth year]  Age: 71 y.o. MRN: [id number]  CC: No chief complaint on file.   HPI   Theresa Aguirre presents for COPD, anxiety, OA f/u  Outpatient Medications Prior to Visit  Medication Sig Dispense Refill  . albuterol (PROVENTIL HFA;VENTOLIN HFA) 108 (90 Base) MCG/ACT inhaler Inhale 2 puffs into the lungs every 6 (six) hours as needed. 1 Inhaler 3  . ALPRAZolam (XANAX) 1 MG tablet TAKE 1 TABLET BY MOUTH FOUR TIMES DAILY AS NEEDED FOR SLEEP OR ANXIETY 360 tablet 1  . aspirin (BAYER ASPIRIN) 325 MG tablet Take 0.5 tablets (162 mg total) by mouth daily. 100 tablet 3  . b complex vitamins tablet Take 1 tablet by mouth daily. 100 tablet 3  . clobetasol cream (TEMOVATE) 0.05 % APPLY TO AFFECTED AREA BID  3  . escitalopram (LEXAPRO) 10 MG tablet Take 1 tablet (10 mg total) by mouth daily. 30 tablet 11  . Fluticasone-Salmeterol (ADVAIR DISKUS) 250-50 MCG/DOSE AEPB Inhale 1 puff into the lungs 2 (two) times daily. 1 each 11  . hydrochlorothiazide (MICROZIDE) 12.5 MG capsule TAKE ONE CAPSULE BY MOUTH DAILY 90 capsule 1  . lipase/protease/amylase (CREON) 36000 UNITS CPEP capsule Take 1 capsule (36,000 Units total) by mouth 3 (three) times daily before meals. 90 capsule 11  . roflumilast (DALIRESP) 500 MCG TABS tablet Take 1 tablet (500 mcg total) by mouth daily. 30 tablet 5  . temazepam (RESTORIL) 15 MG capsule TAKE 1 TO 2 CAPSULES BY MOUTH EVERY NIGHT AT BEDTIME AS NEEDED FOR INSOMNIA 180 capsule 1  . fluticasone (FLONASE) 50 MCG/ACT nasal spray Place 2 sprays into both nostrils daily. 16 g 6  . pantoprazole (PROTONIX) 40 MG tablet Take 1 tablet (40 mg total) by mouth daily. 90 tablet 3   Facility-Administered Medications Prior to Visit  Medication Dose Route Frequency Provider Last Rate Last Dose  . 0.9 %  sodium chloride infusion  500 mL Intravenous Continuous Irene Shipper, MD        ROS Review of Systems  Constitutional: Positive for  fatigue. Negative for activity change, appetite change, chills and unexpected weight change.  HENT: Negative for congestion, mouth sores and sinus pressure.   Eyes: Negative for visual disturbance.  Respiratory: Positive for cough and shortness of breath. Negative for chest tightness.   Gastrointestinal: Negative for abdominal pain and nausea.  Genitourinary: Negative for difficulty urinating, frequency and vaginal pain.  Musculoskeletal: Positive for arthralgias and back pain. Negative for gait problem.  Skin: Negative for pallor and rash.  Neurological: Negative for dizziness, tremors, weakness, numbness and headaches.  Psychiatric/Behavioral: Negative for confusion and sleep disturbance.    Objective:  BP 122/80 (BP Location: Left Arm, Patient Position: Sitting, Cuff Size: Normal)   Pulse 91   Temp 98 F (36.7 C) (Oral)   Ht 5' 1.5" (1.562 m)   Wt 142 lb (64.4 kg)   SpO2 98%   BMI 26.40 kg/m   BP Readings from Last 3 Encounters:  06/02/17 122/80  02/27/17 126/76  01/09/17 136/88    Wt Readings from Last 3 Encounters:  06/02/17 142 lb (64.4 kg)  02/27/17 151 lb 1.3 oz (68.5 kg)  01/09/17 158 lb (71.7 kg)    Physical Exam  Constitutional: She appears well-developed. No distress.  HENT:  Head: Normocephalic.  Right Ear: External ear normal.  Left Ear: External ear normal.  Nose: Nose normal.  Mouth/Throat: Oropharynx is clear and moist.  Eyes:  Pupils are equal, round, and reactive to light. Conjunctivae are normal. Right eye exhibits no discharge. Left eye exhibits no discharge.  Neck: Normal range of motion. Neck supple. No JVD present. No tracheal deviation present. No thyromegaly present.  Cardiovascular: Normal rate, regular rhythm and normal heart sounds.   Pulmonary/Chest: No stridor. No respiratory distress. She has wheezes.  Abdominal: Soft. Bowel sounds are normal. She exhibits no distension and no mass. There is no tenderness. There is no rebound and no  guarding.  Musculoskeletal: She exhibits no edema or tenderness.  Lymphadenopathy:    She has no cervical adenopathy.  Neurological: She displays normal reflexes. No cranial nerve deficit. She exhibits normal muscle tone. Coordination normal.  Skin: No rash noted. No erythema.  Psychiatric: She has a normal mood and affect. Her behavior is normal. Judgment and thought content normal.    Lab Results  Component Value Date   WBC 7.2 09/29/2015   HGB 15.5 (H) 09/29/2015   HCT 46.9 (H) 09/29/2015   PLT 176.0 09/29/2015   GLUCOSE 112 (H) 06/24/2016   CHOL 199 05/01/2015   TRIG 92.0 05/01/2015   HDL 42.00 05/01/2015   LDLDIRECT 162.8 06/28/2011   LDLCALC 139 (H) 05/01/2015   ALT 29 06/24/2016   AST 38 (H) 06/24/2016   NA 143 06/24/2016   K 4.5 06/24/2016   CL 104 06/24/2016   CREATININE 0.80 06/24/2016   BUN 11 06/24/2016   CO2 33 (H) 06/24/2016   TSH 1.52 09/29/2015   HGBA1C 5.6 01/02/2008    No results found.  Assessment & Plan:   There are no diagnoses linked to this encounter. I have discontinued Theresa Aguirre's fluticasone and pantoprazole. I am also having her maintain her lipase/protease/amylase, aspirin, clobetasol cream, albuterol, Fluticasone-Salmeterol, ALPRAZolam, temazepam, b complex vitamins, roflumilast, escitalopram, and hydrochlorothiazide. We will continue to administer sodium chloride.  No orders of the defined types were placed in this encounter.    Follow-up: No Follow-up on file.  Walker Kehr, MD

## 2017-06-02 NOTE — Assessment & Plan Note (Signed)
Chronic. 

## 2017-06-02 NOTE — Assessment & Plan Note (Signed)
Discussed.

## 2017-06-02 NOTE — Addendum Note (Signed)
Addended by: Karren Cobble on: 06/02/2017 09:48 AM   Modules accepted: Orders

## 2017-06-02 NOTE — Assessment & Plan Note (Signed)
Advair Proventil Daliresp

## 2017-06-02 NOTE — Assessment & Plan Note (Signed)
Protonix Potential benefits of a long term PPI use as well as potential risks  and complications were explained to the patient and were aknowledged. 

## 2017-06-14 ENCOUNTER — Other Ambulatory Visit: Payer: Self-pay

## 2017-06-14 MED ORDER — TEMAZEPAM 15 MG PO CAPS: ORAL_CAPSULE | ORAL | 1 refills | Status: DC

## 2017-07-19 ENCOUNTER — Ambulatory Visit (INDEPENDENT_AMBULATORY_CARE_PROVIDER_SITE_OTHER): Payer: Medicare Other | Admitting: Internal Medicine

## 2017-07-19 ENCOUNTER — Encounter: Payer: Self-pay | Admitting: Internal Medicine

## 2017-07-19 DIAGNOSIS — F17200 Nicotine dependence, unspecified, uncomplicated: Secondary | ICD-10-CM | POA: Diagnosis not present

## 2017-07-19 DIAGNOSIS — R0602 Shortness of breath: Secondary | ICD-10-CM | POA: Diagnosis not present

## 2017-07-19 DIAGNOSIS — J449 Chronic obstructive pulmonary disease, unspecified: Secondary | ICD-10-CM | POA: Diagnosis not present

## 2017-07-19 DIAGNOSIS — F331 Major depressive disorder, recurrent, moderate: Secondary | ICD-10-CM

## 2017-07-19 DIAGNOSIS — I6523 Occlusion and stenosis of bilateral carotid arteries: Secondary | ICD-10-CM

## 2017-07-19 DIAGNOSIS — R1310 Dysphagia, unspecified: Secondary | ICD-10-CM

## 2017-07-19 MED ORDER — ROFLUMILAST 500 MCG PO TABS: 500.0000 ug | ORAL_TABLET | Freq: Every day | ORAL | 5 refills | Status: DC

## 2017-07-19 NOTE — Assessment & Plan Note (Signed)
Better on Daliresp

## 2017-07-19 NOTE — Patient Instructions (Signed)
Lactose Intolerance, Adult Lactose is the natural sugar found in milk and milk products, such as cheese and yogurt. Lactose is digested by lactase, an enzyme in your small intestine. Some people do not produce enough lactase to digest lactose. This is called lactose intolerance. Lactose intolerance is different from milk allergy, which is a more serious reaction to the protein in milk. What are the causes? Causes of lactose intolerance may include:  Normal aging. The ability to produce lactase may decline with age, causing lactose intolerance over time.  Being born without the ability to make lactase.  Digestive diseases such as gastroenteritis or inflammatory bowel disease.  Surgery or injuries to your small intestine.  Infection in your intestines.  Certain antibiotic medicines and cancer treatments.  What are the signs or symptoms? Lactose intolerance can cause uncomfortable symptoms. These are likely to occur within 30 minutes to 2 hours after eating or drinking foods containing lactose. Symptoms of lactose intolerance may include:  Nausea.  Diarrhea.  Abdominal cramps or pain.  Bloating.  Gas.  How is this diagnosed? There are several tests your health care provider can do to diagnose lactose intolerance. These tests include a hydrogen breath test and stool acidity test. How is this treated? No treatment can improve your body's ability to produce lactase. However, your symptoms can be controlled by limiting or avoiding milk products and other sources of lactose and adjusting your diet. Lactose-free milk is often tolerated. Lactose digestion may also be improved by adding lactase drops to regular milk or by taking lactase tablets when dairy products are consumed. Tolerance to lactose is individual. Some people may be able to eat or drink small amounts of products with lactose, while other may need to avoid lactose entirely. Talk to your health care provider about what is best  for you. Follow these instructions at home:  Limit or avoidfoods, beverages, and medicines containing lactose as directed by your health care provider.  Read food and medicine labels carefully to avoid products containing lactose, milk solids, casein, or whey.  If you eliminate dairy products, replace the protein, calcium, vitamin D, and other nutrients they contain through other foods. A registered dietitian or your health care provider can help you adjust your diet.  Choose a milk substitute that is fortified with calcium and vitamin D. Be aware that soy milk contains high quality protein, while milks made from nuts or grains contain very little protein.  Use lactase drops or tablets if directed by your health care provider. Contact a health care provider if: You have no relief from your symptoms after eliminating milk products and other sources of lactose. This information is not intended to replace advice given to you by your health care provider. Make sure you discuss any questions you have with your health care provider. Document Released: 09/26/2005 Document Revised: 03/03/2016 Document Reviewed: 12/27/2013 Elsevier Interactive Patient Education  2018 [employer]. ???????????? ????? ??? ???????? (Lactose-Free Diet, Adult) ???? ?? ?? ? ????????? ???????????? ???????, ??????, ? ??? ??????????????? ???????. ??????? ???????????? ????? ??????????? ?????, ?????????????, ? ????????, ? ?????? ? ???????? ?????????. ????????, ??? ??????????? ???????? ???????????? ???? ????????? ? ????????, ??????? ???????? ???????. ? ???? ??? ??????? ???????????? ?????. ??? ????????? ????? ?? ???? ??????  ?? ??????? ??????????? ????????, ???????, ????????, ???????? ??? ?????????, ?????????? ???????. ??????????? ??????? ?????? ????????????.  ?? ????????? ????????? ???????? ?? ????? "??? ???????".  ??????????? ????? ??? ???????? ? ????????? ??? ?????????? ???????, ??? ??????? ????? ??????.  ??? ????? ?  ????????????? ???? ??????????? ???????????? ?????? ??? ?????????? ??????, ????????, ?????? ??????.  ??????? ?? ???, ????? ???????? ? ????? ??????????? ?????????? ??????? ? ????????  D. ???? ???????????? ??????? ????? ????????? ?????????? ?? ???? ?????? ??????????? ?????????.  ?????????? ????????? ??????? ? ???????? D ? ???????????? ? ?????????? ?????. ???? ?? ?? ????????? ??????????? ?????????? ??????? ? ???????? D ? ?????, ?????????? ? ????? ?????? ? ?????? ??????????????? ???????. ? ????? ????????? ?????????? ???????? ??????? ?????????? ? ????????? ?????????:  ?????? ? ????????, ????????????? ?? ??????.  ??????.  ???.  ?????.  ????????.  ???????.  ??????.  ?????????? ?????? ? ??????? ????????.  ????????? ? ?????? ???????? ???????. ??????? ????? ??????? ? ??????? ????????? ??? ????????, ?????????????? ? ??????????? ?????? ??? ???????? ?????????. ????? ??????, ???????? ?? ?????? ???? ?????? ??? ???????? ?????????, ??????????? ?????? ?? ????????????. ????????? ????????? ? ???????? "????? ????????? ??????" ? ?????????, ??????? ????????:  ?????.  ??????.  ??????.  ????? ??????.  ???????? ???????.  ?????????.  ??????.  ????????.  ???????.  ????????????.  ?????????????. ????? ?????????? ?????????? ?????? ? ?????????, ????????????? ? ??????????? ???????  ???????????? ??????.  ?????? ?????? ? ??????????? ??????? ? ???????? D.  ??????????, ????????? ??? ??????? ?????? ? ??????????? ??????? ? ???????? D. ???????? ????????, ??? ??? ???????? ? ?????? ??????????? ?????.  ?????? ????????, ????? ??? ?????? ??????, ?????? ???, ?????? ????????? ? ??????? ?? ?????? ???. ????? ???????? ????? ????? ???????? ???? ? ???????, ?????????????? ??? ??????, ????????, ???????????, ??????? ??? ??????????? ????, ???????, ???? ? ????. ?????????? ???????, ?????????? ????, ????? ? ???????. ??????? ??? ??????? ??? ?????? ??????, ????? ??? ??????? ??????? ? ??????? ?? ???? ??????? ??????.  ??????? ??? ????? ???? ??? ??????? ??? ?????? ??????. ?????????? ???????, ??????, ???-???, ??????, ????, ??????, ?????, ???, ????? ??? ??? ?????? ????????, ?????????????? ??? ?????? ??? ???????. ??????? ???????. ????? ??????, ???????????? ? ???????????????? ????? ??? ???????, ?????????? ??? ????????? ??????. ?????? ??? ??????, ????????????????, ???????????? ??? ??????? ?????? ??? ?????????? ???????. ???? ? ?????? ????????? ????? ??????? ????????, ??????, ????, ???????, ????????, ????????, ???????, ???? ??? ???????. ???????? ?????? ????????. ???? ??????? ??? ??????????????? ???????? ??? ??????. ????. ?????? ?????????? ????. ??????, ???????? ? ?????. ????. ????? ? ??????. ?????????? ??? ?????? ???????? ????? ??? ???????. ????, ????????? ? ????????? ????? ??? ????, ??????, ??? ??????. ???????? ???????? ???????????? ??????. ??????, ??????? ??? ?????????? ?????? ? ??????????? ??????? ? ???????? ?, ?????? ??? ? ?????? ??????. ??????? ???????????? ???????. ???. ????, ??????????????? ???? ? ????????? ???? ?????????????????? ????. ????????? ? ??????? ????. ????????/????????? ????? ?????? ????. ??????? ?? ???????????? ????????. ??????. ???? ?? ????. ????? ??? ???????. ???????. ?????? ???????? ? ??????. ??????. ???????. ?????? ?????. ??????. ???????? ? ??????? ??????? ? ????????? ???. ???????. ???????, ?????? ??? ?????, ????????????? ?? ??????????? ????????????, ????????, ??????? ???????. ?????? ?? ???? ??? ??????????? ??????. ??????????? ??????? ? ????. ???????????? ??????? ?? ??????, ????????? ??? ??????? ??????. ?????. ???. ???????, ????, ????????. ???????. "??????" ???????. ?????? ??????? ??? ??????. ??????. ???? ? ????? ????????? ? ???????? ??? ???????, ??????? ?? ???????? ??????. ?????. ???????????? ?????. ?????????? ???. ???????. ?????? ??? ????????? ????. ????????????? ???? ?? ???????? ?????? ??????? ??????????????? ????????? ??????? ??? ????????. ? ????????? ?????? ????????? ??????????????????? ?  ??????????. ????? ???????? ?? ?????????????? ???????? ???? ? ???????, ??????? ???????? ??????. ????????? ???????. ?????, ???????, ?????, ?????????? ???? ? ?????. ??? ????? ???? ???????????? ?? ???????????? ?????? ??? ?????? ????????? ?????????????. ??????? ???????, ???????, ?????, ?????????? ?????????? ????, ????? (????????????? ??????????? ?????), ??????? ?? ???? ? ???????? ??? ??????????? ???? ? ??????????? ?????? ??? ???????? ????????????. ???????, ?????????? ???????. ?????????? ???????. ??????? ??? ????? ???? ? ????????. ????? ????? ? ????????? ??? ?? ????????. ????? ? ?????? ??? ???????? ????? ??? ? ?????????????????? ???????????. ???????????? ???? ???????? ?????????????. ????????? ???. ???????????? ?????? ??? ????????? ? ?????????. ?????? ?? ?????????. ???? ? ?????? ????????? ????? ???????, ????? ? ?????, ??????? ???????? ??????. ????, ????, ?????? ??? ??????? ? ????????? ??? ?? ????????. ????????? ???????, ????? ??? ??????? ? ??????? ?? ????????????. ?????? ???????, ??????? ???????? ????? ??????. ???, ??????, ??? ??????? ? ?????? ??????. ??????? ? ???????? ? ?????. ?????. ?????????? ??? ?????? ???????? ????? ? ??????????? ?????? ??????. ????????? ??? ????????? ?????, ?????????? ?????? ??? ???. ???????? ???????? ??? ???????? ????????, ? ??? ????? [???????] ??????, ????? ??????, ?????, ?????, ??????????, ????????????????? ??????, ????????? ??????, ????????????????? ??????, ?????????????????? ????????? ??????, ??????-??????, ??????, ??? ? ?????? ??????. ??????? ??????? ???????. ????? ? ????????. ???????????? ??? ???????? ?????????????. ?????????? ????????? ???????. ????? ? ??????????? ?????? ??? ???????. ????????/????????? ????? ??????????? ???????, ?????????? ???????. ????? ? ????????. ????? ??????, ???? ??? ???????? ???????? ????????. ????????????? ?????????????, ? ??????? ?????????? ???????. ?????????? ??????. ???????? ? ??????? ?????????, ???????????? ??????, ?????? ? ???????????? ??????.  ???????? ????, ??????, ????. ????, ?????? ?? ????????, ??????? ? ?????? ???????, ?????????? ??????, ??????, ????????? ??? ??? ???????? ???????. ?????, ?????????????? ? ??????????? ????????????????? ????????? ??? ?????????? ?????. ??????????????? ??????? ? ???????????? ??????, ??????????? ???????. ????? ? ??????. ????????, ????? ??? ?????? ???????, ? ??????? ?????????? ??????. ???????. ????????. ???? ? ????? ????????? ? ???????? ??? ???????, ??????? ???????? ?????? ??? ???. ??????. ????? ?????? ? ??????. ????????? ???. ???????. ????? ??? ??????? ??????, ????????? ?? ???????? ??? ????????. ????????????? ???? ?? ???????? ?????? ??????? ??????????????? ????????? ??????? ??? ????????. ?????????? ? ????????? ??? ????????? ?????????????? ??????????. ?????????? ?? ? ??????? ???????? ??????? ?????????? ?? ?????? ????????? ? ????????; ?? ????? ???????????? ???????? ??? ??????????? ??????????? ????????? ??????. ??????????? ?????????? ??????? ??????? ?? ????????:  ???????? ??????  50 ???: 1000 ?? ??????? ? ????.  ???????? ?????? 50 ???: 1200 ?? ??????? ? ????. ???? ?? ?? ????????? ??????????? ?????????? ???????, ???????? ???????? ?? ?????? ????????? ???????:  ???????????? ??? ? ?????????? ????????. ? 1 ??????? [??????] ????????????? ???? ?????????? 300-350 ?? ???????.  ??????? ?? ?????????? ???????. ? 3 ?????? (???????? 90 ?) [?????] ?????? ?????????? 325 ?? ???????.  ??????????? ???????? ?????? ??????. ? 1 ??????? ???????????? ???????? ??????? ?????? ?????????? 300-400 ?? ???????.  ??????????? ???????? ??????? ??? ?????????? ??????. ? 1 ??????? ???????????? ???????? ???????? ??? ??????????? ?????? ?????????? 300 ?? ???????.  ???????????????? ?????? ?? ?????????? ???????. ? 3 ?????? ????????????????? ?????? ?? ?????????? ??????? ?????????? 180 ?? ???????.  ??????????? ???????? ???????? ????????. ? ??????????? ???????? ???????? ????????? ?????????? [phone] ?? ???????.  ?????? ?????? ? ????????? ???????. ?   ????? ??????? ??????? ? ????????? ??????? ?????????? 250 ?? ???????.  ?????? (??????????????). ?  ????? ??????????????? ??????? ?????????? 145 ?? ???????.  ??????? ?????? ???? (??????????????). ?  ????? ?????????????? ?????? ????? ???????? ???????? ?????????? 130 ?? ???????.  ???????? ??????? (??????????????). ?  ????? ?????????????? ???????? ??????? ?????????? 125 ?? ???????.  ??????? ???????????? (???????????? ??? ??????????????). ?  ????? ?????????????? ??????? ?????????? 90 ?? ???????.  ???????. ? 1/4 ????? ??????? ?????????? 95 ?? ???????.  ???????? (??????????????). ? 1 ????? ?????????????? ???????? ?????????? 60 ?? ???????. ??? ?????????? ?? ????? ???????? ??????, ??????????????? ????? ??????. ??????????? ???????? ????? ???????????? ??? ??????? ? ????? ??????? ??????. Document Released: 09/08/2008 Document Revised: 02/10/2015 Document Reviewed: 12/27/2013 Elsevier Interactive Patient Education  2018 [employer].

## 2017-07-19 NOTE — Assessment & Plan Note (Signed)
lexapro

## 2017-07-19 NOTE — Assessment & Plan Note (Signed)
ASA

## 2017-07-19 NOTE — Progress Notes (Signed)
Subjective:  Patient ID: Theresa Aguirre, female    DOB: Feb 26, 1946  Age: 71 y.o. MRN: [id number]  CC: No chief complaint on file.   HPI Theresa Aguirre presents for anxiety, COPD, depression, GERD f/u  Outpatient Medications Prior to Visit  Medication Sig Dispense Refill  . albuterol (PROVENTIL HFA;VENTOLIN HFA) 108 (90 Base) MCG/ACT inhaler Inhale 2 puffs into the lungs every 6 (six) hours as needed. 1 Inhaler 3  . ALPRAZolam (XANAX) 1 MG tablet TAKE 1 TABLET BY MOUTH FOUR TIMES DAILY AS NEEDED FOR SLEEP OR ANXIETY 360 tablet 1  . aspirin (BAYER ASPIRIN) 325 MG tablet Take 0.5 tablets (162 mg total) by mouth daily. 100 tablet 3  . b complex vitamins tablet Take 1 tablet by mouth daily. 100 tablet 3  . clobetasol cream (TEMOVATE) 0.05 % APPLY TO AFFECTED AREA BID  3  . Fluticasone-Salmeterol (ADVAIR DISKUS) 250-50 MCG/DOSE AEPB Inhale 1 puff into the lungs 2 (two) times daily. 1 each 11  . hydrochlorothiazide (MICROZIDE) 12.5 MG capsule TAKE ONE CAPSULE BY MOUTH DAILY 90 capsule 1  . lipase/protease/amylase (CREON) 36000 UNITS CPEP capsule Take 1 capsule (36,000 Units total) by mouth 3 (three) times daily before meals. 90 capsule 11  . roflumilast (DALIRESP) 500 MCG TABS tablet Take 1 tablet (500 mcg total) by mouth daily. 30 tablet 5  . temazepam (RESTORIL) 15 MG capsule TAKE 1 TO 2 CAPSULES BY MOUTH EVERY NIGHT AT BEDTIME AS NEEDED FOR INSOMNIA 180 capsule 1  . escitalopram (LEXAPRO) 10 MG tablet Take 1 tablet (10 mg total) by mouth daily. (Patient not taking: Reported on 07/19/2017) 30 tablet 11   Facility-Administered Medications Prior to Visit  Medication Dose Route Frequency Provider Last Rate Last Dose  . 0.9 %  sodium chloride infusion  500 mL Intravenous Continuous Irene Shipper, MD        ROS Review of Systems  Constitutional: Positive for fatigue. Negative for activity change, appetite change, chills and unexpected weight change.  HENT: Negative for congestion, mouth sores  and sinus pressure.   Eyes: Negative for visual disturbance.  Respiratory: Positive for wheezing. Negative for cough and chest tightness.   Gastrointestinal: Negative for abdominal pain and nausea.  Genitourinary: Negative for difficulty urinating, frequency and vaginal pain.  Musculoskeletal: Positive for arthralgias, back pain and neck stiffness. Negative for gait problem.  Skin: Negative for pallor and rash.  Neurological: Negative for dizziness, tremors, weakness, numbness and headaches.  Psychiatric/Behavioral: Negative for confusion and sleep disturbance. The patient is nervous/anxious.     Objective:  BP 120/78 (BP Location: Left Arm, Patient Position: Sitting, Cuff Size: Large)   Pulse 77   Temp 97.8 F (36.6 C) (Oral)   Ht 5' 1.5" (1.562 m)   Wt 141 lb (64 kg)   SpO2 100%   BMI 26.21 kg/m   BP Readings from Last 3 Encounters:  07/19/17 120/78  06/02/17 122/80  02/27/17 126/76    Wt Readings from Last 3 Encounters:  07/19/17 141 lb (64 kg)  06/02/17 142 lb (64.4 kg)  02/27/17 151 lb 1.3 oz (68.5 kg)    Physical Exam  Constitutional: She appears well-developed. No distress.  HENT:  Head: Normocephalic.  Right Ear: External ear normal.  Left Ear: External ear normal.  Nose: Nose normal.  Mouth/Throat: Oropharynx is clear and moist.  Eyes: Pupils are equal, round, and reactive to light. Conjunctivae are normal. Right eye exhibits no discharge. Left eye exhibits no discharge.  Neck: Normal range of motion.  Neck supple. No JVD present. No tracheal deviation present. No thyromegaly present.  Cardiovascular: Normal rate, regular rhythm and normal heart sounds.   Pulmonary/Chest: No stridor. No respiratory distress. She has no wheezes.  Abdominal: Soft. Bowel sounds are normal. She exhibits no distension and no mass. There is no tenderness. There is no rebound and no guarding.  Musculoskeletal: She exhibits tenderness. She exhibits no edema.  Lymphadenopathy:    She  has no cervical adenopathy.  Neurological: She displays normal reflexes. No cranial nerve deficit. She exhibits normal muscle tone. Coordination normal.  Skin: No rash noted. No erythema.  Psychiatric: She has a normal mood and affect. Her behavior is normal. Judgment and thought content normal.    Lab Results  Component Value Date   WBC 7.5 06/02/2017   HGB 14.6 06/02/2017   HCT 44.8 06/02/2017   PLT 210.0 06/02/2017   GLUCOSE 122 (H) 06/02/2017   CHOL 243 (H) 06/02/2017   TRIG 119.0 06/02/2017   HDL 42.80 06/02/2017   LDLDIRECT 162.8 06/28/2011   LDLCALC 177 (H) 06/02/2017   ALT 17 06/02/2017   AST 28 06/02/2017   NA 140 06/02/2017   K 4.1 06/02/2017   CL 102 06/02/2017   CREATININE 0.75 06/02/2017   BUN 12 06/02/2017   CO2 32 06/02/2017   TSH 1.90 06/02/2017   HGBA1C 5.6 01/02/2008    No results found.  Assessment & Plan:   There are no diagnoses linked to this encounter. I have discontinued Ms. Roye's escitalopram. I am also having her maintain her lipase/protease/amylase, aspirin, clobetasol cream, albuterol, Fluticasone-Salmeterol, b complex vitamins, roflumilast, hydrochlorothiazide, ALPRAZolam, and temazepam. We will continue to administer sodium chloride.  No orders of the defined types were placed in this encounter.    Follow-up: No Follow-up on file.  Walker Kehr, MD

## 2017-07-19 NOTE — Assessment & Plan Note (Signed)
Discussed.

## 2017-07-25 ENCOUNTER — Other Ambulatory Visit: Payer: Self-pay | Admitting: Internal Medicine

## 2017-07-25 DIAGNOSIS — I6523 Occlusion and stenosis of bilateral carotid arteries: Secondary | ICD-10-CM

## 2017-07-28 ENCOUNTER — Ambulatory Visit (HOSPITAL_COMMUNITY)
Admission: RE | Admit: 2017-07-28 | Discharge: 2017-07-28 | Disposition: A | Discharge status: Home / Self Care | Payer: Medicare Other | Source: Ambulatory Visit | Attending: Cardiovascular Disease | Admitting: Cardiovascular Disease

## 2017-07-28 DIAGNOSIS — I6523 Occlusion and stenosis of bilateral carotid arteries: Secondary | ICD-10-CM | POA: Diagnosis present

## 2017-08-02 ENCOUNTER — Telehealth: Payer: Self-pay

## 2017-08-02 NOTE — Telephone Encounter (Signed)
-----   Message from Cassandria Anger, MD sent at 07/31/2017  4:50 PM EDT ----- Inez Catalina, Please inform the patient that her carotid plaque is stable. Repeat US in 12 mo Thanks, AP

## 2017-08-02 NOTE — Telephone Encounter (Signed)
Pt has been informed and expressed understanding.  

## 2017-08-08 ENCOUNTER — Telehealth: Payer: Self-pay | Admitting: Internal Medicine

## 2017-08-08 ENCOUNTER — Other Ambulatory Visit: Payer: Self-pay

## 2017-08-08 MED ORDER — ALBUTEROL SULFATE HFA 108 (90 BASE) MCG/ACT IN AERS: 2.0000 | INHALATION_SPRAY | Freq: Four times a day (QID) | RESPIRATORY_TRACT | 2 refills | Status: DC | PRN

## 2017-08-08 MED ORDER — LEVALBUTEROL TARTRATE 45 MCG/ACT IN AERO: 2.0000 | INHALATION_SPRAY | Freq: Four times a day (QID) | RESPIRATORY_TRACT | 2 refills | Status: DC | PRN

## 2017-08-08 NOTE — Telephone Encounter (Signed)
Pt came by office requesting a refill on albuterol (PROVENTIL HFA;VENTOLIN HFA) 108 (90 Base) MCG/ACT inhaler.  Pt  Is still using [facility] on [address].  Pt did set up a 3 mo f/u visit for 09/04/17.

## 2017-08-08 NOTE — Telephone Encounter (Signed)
Reviewed chart pt is up-to-date sent refills to [facility].../lmb  

## 2017-08-11 ENCOUNTER — Ambulatory Visit (INDEPENDENT_AMBULATORY_CARE_PROVIDER_SITE_OTHER): Payer: Medicare Other | Admitting: Internal Medicine

## 2017-08-11 ENCOUNTER — Encounter: Payer: Self-pay | Admitting: Internal Medicine

## 2017-08-11 DIAGNOSIS — F4323 Adjustment disorder with mixed anxiety and depressed mood: Secondary | ICD-10-CM | POA: Diagnosis not present

## 2017-08-11 DIAGNOSIS — I6523 Occlusion and stenosis of bilateral carotid arteries: Secondary | ICD-10-CM | POA: Diagnosis not present

## 2017-08-11 DIAGNOSIS — F331 Major depressive disorder, recurrent, moderate: Secondary | ICD-10-CM

## 2017-08-11 DIAGNOSIS — J449 Chronic obstructive pulmonary disease, unspecified: Secondary | ICD-10-CM

## 2017-08-11 MED ORDER — LEVALBUTEROL TARTRATE 45 MCG/ACT IN AERO: 2.0000 | INHALATION_SPRAY | Freq: Four times a day (QID) | RESPIRATORY_TRACT | 11 refills | Status: DC | PRN

## 2017-08-11 MED ORDER — ALPRAZOLAM 1 MG PO TABS: ORAL_TABLET | ORAL | 1 refills | Status: DC

## 2017-08-11 NOTE — Assessment & Plan Note (Signed)
Xanax prn 

## 2017-08-11 NOTE — Progress Notes (Signed)
Subjective:  Patient ID: Theresa Aguirre, female    DOB: 1946-07-12  Age: 71 y.o. MRN: [id number]  CC: No chief complaint on file.   HPI Kenna Seward presents for anxiety, COPD, HTN f/u. Breathing is better on Daliresp  Outpatient Medications Prior to Visit  Medication Sig Dispense Refill  . albuterol (PROVENTIL HFA;VENTOLIN HFA) 108 (90 Base) MCG/ACT inhaler Inhale 2 puffs into the lungs every 6 (six) hours as needed. 1 Inhaler 2  . ALPRAZolam (XANAX) 1 MG tablet TAKE 1 TABLET BY MOUTH FOUR TIMES DAILY AS NEEDED FOR SLEEP OR ANXIETY 360 tablet 1  . aspirin (BAYER ASPIRIN) 325 MG tablet Take 0.5 tablets (162 mg total) by mouth daily. 100 tablet 3  . b complex vitamins tablet Take 1 tablet by mouth daily. 100 tablet 3  . clobetasol cream (TEMOVATE) 0.05 % APPLY TO AFFECTED AREA BID  3  . Fluticasone-Salmeterol (ADVAIR DISKUS) 250-50 MCG/DOSE AEPB Inhale 1 puff into the lungs 2 (two) times daily. 1 each 11  . hydrochlorothiazide (MICROZIDE) 12.5 MG capsule TAKE ONE CAPSULE BY MOUTH DAILY 90 capsule 1  . levalbuterol (XOPENEX HFA) 45 MCG/ACT inhaler Inhale 2 puffs into the lungs every 6 (six) hours as needed for wheezing. 1 Inhaler 2  . lipase/protease/amylase (CREON) 36000 UNITS CPEP capsule Take 1 capsule (36,000 Units total) by mouth 3 (three) times daily before meals. 90 capsule 11  . roflumilast (DALIRESP) 500 MCG TABS tablet Take 1 tablet (500 mcg total) by mouth daily. 90 tablet 5  . temazepam (RESTORIL) 15 MG capsule TAKE 1 TO 2 CAPSULES BY MOUTH EVERY NIGHT AT BEDTIME AS NEEDED FOR INSOMNIA 180 capsule 1   Facility-Administered Medications Prior to Visit  Medication Dose Route Frequency Provider Last Rate Last Dose  . 0.9 %  sodium chloride infusion  500 mL Intravenous Continuous Irene Shipper, MD        ROS Review of Systems  Constitutional: Positive for fatigue. Negative for activity change, appetite change, chills and unexpected weight change.  HENT: Negative for  congestion, mouth sores and sinus pressure.   Eyes: Negative for visual disturbance.  Respiratory: Positive for shortness of breath. Negative for cough and chest tightness.   Gastrointestinal: Negative for abdominal pain and nausea.  Genitourinary: Negative for difficulty urinating, frequency and vaginal pain.  Musculoskeletal: Positive for arthralgias. Negative for back pain and gait problem.  Skin: Negative for pallor and rash.  Neurological: Negative for dizziness, tremors, weakness, numbness and headaches.  Psychiatric/Behavioral: Positive for dysphoric mood and sleep disturbance. Negative for confusion and suicidal ideas. The patient is nervous/anxious.     Objective:  BP 140/88 (BP Location: Left Arm, Patient Position: Sitting, Cuff Size: Normal)   Pulse 80   Temp 98 F (36.7 C) (Oral)   Ht 5' 1.5" (1.562 m)   Wt 139 lb (63 kg)   SpO2 100%   BMI 25.84 kg/m   BP Readings from Last 3 Encounters:  08/11/17 140/88  07/19/17 120/78  06/02/17 122/80    Wt Readings from Last 3 Encounters:  08/11/17 139 lb (63 kg)  07/19/17 141 lb (64 kg)  06/02/17 142 lb (64.4 kg)    Physical Exam  Constitutional: She appears well-developed. No distress.  HENT:  Head: Normocephalic.  Right Ear: External ear normal.  Left Ear: External ear normal.  Nose: Nose normal.  Mouth/Throat: Oropharynx is clear and moist.  Eyes: Pupils are equal, round, and reactive to light. Conjunctivae are normal. Right eye exhibits no discharge. Left eye  exhibits no discharge.  Neck: Normal range of motion. Neck supple. No JVD present. No tracheal deviation present. No thyromegaly present.  Cardiovascular: Normal rate, regular rhythm and normal heart sounds.   Pulmonary/Chest: No stridor. No respiratory distress. She has no wheezes.  Abdominal: Soft. Bowel sounds are normal. She exhibits no distension and no mass. There is no tenderness. There is no rebound and no guarding.  Musculoskeletal: She exhibits  tenderness. She exhibits no edema.  Lymphadenopathy:    She has no cervical adenopathy.  Neurological: She displays normal reflexes. No cranial nerve deficit. She exhibits normal muscle tone. Coordination normal.  Skin: No rash noted. No erythema.  Psychiatric: She has a normal mood and affect. Her behavior is normal. Judgment and thought content normal.  mild ronchi B  Lab Results  Component Value Date   WBC 7.5 06/02/2017   HGB 14.6 06/02/2017   HCT 44.8 06/02/2017   PLT 210.0 06/02/2017   GLUCOSE 122 (H) 06/02/2017   CHOL 243 (H) 06/02/2017   TRIG 119.0 06/02/2017   HDL 42.80 06/02/2017   LDLDIRECT 162.8 06/28/2011   LDLCALC 177 (H) 06/02/2017   ALT 17 06/02/2017   AST 28 06/02/2017   NA 140 06/02/2017   K 4.1 06/02/2017   CL 102 06/02/2017   CREATININE 0.75 06/02/2017   BUN 12 06/02/2017   CO2 32 06/02/2017   TSH 1.90 06/02/2017   HGBA1C 5.6 01/02/2008    No results found.  Assessment & Plan:   There are no diagnoses linked to this encounter. I am having Ms. Bazar maintain her lipase/protease/amylase, aspirin, clobetasol cream, Fluticasone-Salmeterol, b complex vitamins, hydrochlorothiazide, ALPRAZolam, temazepam, roflumilast, albuterol, and levalbuterol. We will continue to administer sodium chloride.  No orders of the defined types were placed in this encounter.    Follow-up: No Follow-up on file.  Walker Kehr, MD

## 2017-08-11 NOTE — Assessment & Plan Note (Signed)
Lexapro 

## 2017-08-11 NOTE — Assessment & Plan Note (Signed)
Daliresp  Xopenex prn

## 2017-09-04 ENCOUNTER — Encounter: Payer: Self-pay | Admitting: Internal Medicine

## 2017-09-04 ENCOUNTER — Ambulatory Visit (INDEPENDENT_AMBULATORY_CARE_PROVIDER_SITE_OTHER): Payer: Medicare Other | Admitting: Internal Medicine

## 2017-09-04 DIAGNOSIS — F331 Major depressive disorder, recurrent, moderate: Secondary | ICD-10-CM

## 2017-09-04 DIAGNOSIS — I6523 Occlusion and stenosis of bilateral carotid arteries: Secondary | ICD-10-CM

## 2017-09-04 DIAGNOSIS — E785 Hyperlipidemia, unspecified: Secondary | ICD-10-CM | POA: Diagnosis not present

## 2017-09-04 DIAGNOSIS — F4323 Adjustment disorder with mixed anxiety and depressed mood: Secondary | ICD-10-CM | POA: Diagnosis not present

## 2017-09-04 DIAGNOSIS — J449 Chronic obstructive pulmonary disease, unspecified: Secondary | ICD-10-CM

## 2017-09-04 MED ORDER — VENTOLIN HFA 108 (90 BASE) MCG/ACT IN AERS: 2.0000 | INHALATION_SPRAY | RESPIRATORY_TRACT | 11 refills | Status: DC | PRN

## 2017-09-04 NOTE — Assessment & Plan Note (Signed)
C/o HAs w/Xopenex and Dulera. Can use Ventolin

## 2017-09-04 NOTE — Progress Notes (Signed)
Subjective:  Patient ID: Theresa Aguirre, female    DOB: [birth year]  Age: 71 y.o. MRN: [id number]  CC: No chief complaint on file.   HPI Prisilla Kocsis presents for COPD, insomnia f/u C/o HAs w/Xopenex and Dulera. Can use Ventolin   Outpatient Medications Prior to Visit  Medication Sig Dispense Refill  . ALPRAZolam (XANAX) 1 MG tablet TAKE 1 TABLET BY MOUTH FOUR TIMES DAILY AS NEEDED FOR SLEEP OR ANXIETY 360 tablet 1  . aspirin (BAYER ASPIRIN) 325 MG tablet Take 0.5 tablets (162 mg total) by mouth daily. 100 tablet 3  . b complex vitamins tablet Take 1 tablet by mouth daily. 100 tablet 3  . clobetasol cream (TEMOVATE) 0.05 % APPLY TO AFFECTED AREA BID  3  . Fluticasone-Salmeterol (ADVAIR DISKUS) 250-50 MCG/DOSE AEPB Inhale 1 puff into the lungs 2 (two) times daily. 1 each 11  . hydrochlorothiazide (MICROZIDE) 12.5 MG capsule TAKE ONE CAPSULE BY MOUTH DAILY 90 capsule 1  . levalbuterol (XOPENEX HFA) 45 MCG/ACT inhaler Inhale 2 puffs into the lungs every 6 (six) hours as needed for wheezing. 1 Inhaler 11  . lipase/protease/amylase (CREON) 36000 UNITS CPEP capsule Take 1 capsule (36,000 Units total) by mouth 3 (three) times daily before meals. 90 capsule 11  . roflumilast (DALIRESP) 500 MCG TABS tablet Take 1 tablet (500 mcg total) by mouth daily. 90 tablet 5  . temazepam (RESTORIL) 15 MG capsule TAKE 1 TO 2 CAPSULES BY MOUTH EVERY NIGHT AT BEDTIME AS NEEDED FOR INSOMNIA 180 capsule 1  . VENTOLIN HFA 108 (90 Base) MCG/ACT inhaler      Facility-Administered Medications Prior to Visit  Medication Dose Route Frequency Provider Last Rate Last Dose  . 0.9 %  sodium chloride infusion  500 mL Intravenous Continuous Irene Shipper, MD        ROS Review of Systems  Constitutional: Negative for activity change, appetite change, chills, fatigue and unexpected weight change.  HENT: Negative for congestion, mouth sores and sinus pressure.   Eyes: Negative for visual disturbance.  Respiratory:  Positive for wheezing. Negative for cough and chest tightness.   Gastrointestinal: Negative for abdominal pain and nausea.  Genitourinary: Negative for difficulty urinating, frequency and vaginal pain.  Musculoskeletal: Positive for arthralgias. Negative for back pain and gait problem.  Skin: Negative for pallor and rash.  Neurological: Negative for dizziness, tremors, weakness, numbness and headaches.  Psychiatric/Behavioral: Positive for dysphoric mood. Negative for confusion, sleep disturbance and suicidal ideas. The patient is nervous/anxious.     Objective:  BP 138/84 (BP Location: Left Arm, Patient Position: Sitting, Cuff Size: Normal)   Pulse 100   Temp 98 F (36.7 C) (Oral)   Ht 5' 1.5" (1.562 m)   Wt 143 lb (64.9 kg)   SpO2 99%   BMI 26.58 kg/m   BP Readings from Last 3 Encounters:  09/04/17 138/84  08/11/17 140/88  07/19/17 120/78    Wt Readings from Last 3 Encounters:  09/04/17 143 lb (64.9 kg)  08/11/17 139 lb (63 kg)  07/19/17 141 lb (64 kg)    Physical Exam  Constitutional: She appears well-developed. No distress.  HENT:  Head: Normocephalic.  Right Ear: External ear normal.  Left Ear: External ear normal.  Nose: Nose normal.  Mouth/Throat: Oropharynx is clear and moist.  Eyes: Conjunctivae are normal. Pupils are equal, round, and reactive to light. Right eye exhibits no discharge. Left eye exhibits no discharge.  Neck: Normal range of motion. Neck supple. No JVD present. No tracheal  deviation present. No thyromegaly present.  Cardiovascular: Normal rate, regular rhythm and normal heart sounds.  Pulmonary/Chest: No stridor. No respiratory distress. She has no wheezes.  Abdominal: Soft. Bowel sounds are normal. She exhibits no distension and no mass. There is no tenderness. There is no rebound and no guarding.  Musculoskeletal: She exhibits no edema or tenderness.  Lymphadenopathy:    She has no cervical adenopathy.  Neurological: She displays normal  reflexes. No cranial nerve deficit. She exhibits normal muscle tone. Coordination normal.  Skin: No rash noted. No erythema.  Psychiatric: She has a normal mood and affect. Her behavior is normal. Judgment and thought content normal.    Lab Results  Component Value Date   WBC 7.5 06/02/2017   HGB 14.6 06/02/2017   HCT 44.8 06/02/2017   PLT 210.0 06/02/2017   GLUCOSE 122 (H) 06/02/2017   CHOL 243 (H) 06/02/2017   TRIG 119.0 06/02/2017   HDL 42.80 06/02/2017   LDLDIRECT 162.8 06/28/2011   LDLCALC 177 (H) 06/02/2017   ALT 17 06/02/2017   AST 28 06/02/2017   NA 140 06/02/2017   K 4.1 06/02/2017   CL 102 06/02/2017   CREATININE 0.75 06/02/2017   BUN 12 06/02/2017   CO2 32 06/02/2017   TSH 1.90 06/02/2017   HGBA1C 5.6 01/02/2008    No results found.  Assessment & Plan:   There are no diagnoses linked to this encounter. I am having Theresa Aguirre maintain her lipase/protease/amylase, aspirin, clobetasol cream, Fluticasone-Salmeterol, b complex vitamins, hydrochlorothiazide, temazepam, roflumilast, ALPRAZolam, levalbuterol, and VENTOLIN HFA. We will continue to administer sodium chloride.  No orders of the defined types were placed in this encounter.    Follow-up: No Follow-up on file.  Walker Kehr, MD

## 2017-09-04 NOTE — Assessment & Plan Note (Signed)
Declined statins. 

## 2017-09-04 NOTE — Assessment & Plan Note (Signed)
Lexapro 

## 2017-09-04 NOTE — Assessment & Plan Note (Signed)
Xanax prn 

## 2017-10-02 ENCOUNTER — Ambulatory Visit: Payer: Medicare Other | Admitting: Internal Medicine

## 2017-10-16 ENCOUNTER — Telehealth: Payer: Self-pay | Admitting: Internal Medicine

## 2017-10-16 MED ORDER — HYDROCHLOROTHIAZIDE 12.5 MG PO CAPS: 12.5000 mg | ORAL_CAPSULE | Freq: Every day | ORAL | 3 refills | Status: DC

## 2017-10-16 MED ORDER — PANTOPRAZOLE SODIUM 40 MG PO TBEC: 40.0000 mg | DELAYED_RELEASE_TABLET | Freq: Every day | ORAL | 3 refills | Status: DC

## 2017-10-16 NOTE — Telephone Encounter (Signed)
Renew meds 

## 2017-11-06 ENCOUNTER — Encounter: Payer: Self-pay | Admitting: Internal Medicine

## 2017-11-06 ENCOUNTER — Ambulatory Visit (INDEPENDENT_AMBULATORY_CARE_PROVIDER_SITE_OTHER): Payer: Medicare Other | Admitting: Internal Medicine

## 2017-11-06 DIAGNOSIS — J069 Acute upper respiratory infection, unspecified: Secondary | ICD-10-CM

## 2017-11-06 DIAGNOSIS — J449 Chronic obstructive pulmonary disease, unspecified: Secondary | ICD-10-CM

## 2017-11-06 MED ORDER — PREDNISONE 10 MG PO TABS: ORAL_TABLET | ORAL | 0 refills | Status: DC

## 2017-11-06 MED ORDER — AZITHROMYCIN 250 MG PO TABS: ORAL_TABLET | ORAL | 0 refills | Status: DC

## 2017-11-06 NOTE — Assessment & Plan Note (Signed)
Zpac 

## 2017-11-06 NOTE — Patient Instructions (Signed)
You can use over-the-counter  "cold" medicines  such as  "Afrin" nasal spray for nasal congestion as directed. Use " Delsym" or" Robitussin" cough syrup varietis for cough.  You can use plain "Tylenol" or "Advil" for fever, chills and achyness. Use Halls or Ricola cough drops.     Please, make an appointment if you are not better or if you're worse.  

## 2017-11-06 NOTE — Assessment & Plan Note (Signed)
Prednisone 10 mg: take 4 tabs a day x 3 days; then 3 tabs a day x 4 days; then 2 tabs a day x 4 days, then 1 tab a day x 6 days, then stop. Take pc. Zpac Proventil

## 2017-11-06 NOTE — Progress Notes (Signed)
Subjective:  Patient ID: Theresa Aguirre, female    DOB: 1946-01-03  Age: 72 y.o. MRN: [id number]  CC: No chief complaint on file.   HPI Theresa Aguirre presents for URI sx's x 3-4 d, wheezing Took Prednisone x 3d  Outpatient Medications Prior to Visit  Medication Sig Dispense Refill  . ALPRAZolam (XANAX) 1 MG tablet TAKE 1 TABLET BY MOUTH FOUR TIMES DAILY AS NEEDED FOR SLEEP OR ANXIETY 360 tablet 1  . aspirin (BAYER ASPIRIN) 325 MG tablet Take 0.5 tablets (162 mg total) by mouth daily. 100 tablet 3  . b complex vitamins tablet Take 1 tablet by mouth daily. 100 tablet 3  . clobetasol cream (TEMOVATE) 0.05 % APPLY TO AFFECTED AREA BID  3  . hydrochlorothiazide (MICROZIDE) 12.5 MG capsule Take 1 capsule (12.5 mg total) by mouth daily. 90 capsule 3  . levalbuterol (XOPENEX HFA) 45 MCG/ACT inhaler Inhale 2 puffs into the lungs every 6 (six) hours as needed for wheezing. 1 Inhaler 11  . lipase/protease/amylase (CREON) 36000 UNITS CPEP capsule Take 1 capsule (36,000 Units total) by mouth 3 (three) times daily before meals. 90 capsule 11  . pantoprazole (PROTONIX) 40 MG tablet Take 1 tablet (40 mg total) by mouth daily. 90 tablet 3  . roflumilast (DALIRESP) 500 MCG TABS tablet Take 1 tablet (500 mcg total) by mouth daily. 90 tablet 5  . temazepam (RESTORIL) 15 MG capsule TAKE 1 TO 2 CAPSULES BY MOUTH EVERY NIGHT AT BEDTIME AS NEEDED FOR INSOMNIA 180 capsule 1  . VENTOLIN HFA 108 (90 Base) MCG/ACT inhaler Inhale 2 puffs into the lungs every 4 (four) hours as needed for wheezing or shortness of breath. 1 Inhaler 11   Facility-Administered Medications Prior to Visit  Medication Dose Route Frequency Provider Last Rate Last Dose  . 0.9 %  sodium chloride infusion  500 mL Intravenous Continuous Irene Shipper, MD        ROS Review of Systems  Constitutional: Positive for fatigue. Negative for activity change, appetite change, chills and unexpected weight change.  HENT: Negative for congestion,  mouth sores and sinus pressure.   Eyes: Negative for visual disturbance.  Respiratory: Positive for cough, shortness of breath and wheezing. Negative for chest tightness.   Gastrointestinal: Negative for abdominal pain and nausea.  Genitourinary: Negative for difficulty urinating, frequency and vaginal pain.  Musculoskeletal: Negative for back pain and gait problem.  Skin: Negative for pallor and rash.  Neurological: Negative for dizziness, tremors, weakness, numbness and headaches.  Psychiatric/Behavioral: Negative for confusion and sleep disturbance.    Objective:  BP 132/66 (BP Location: Left Arm, Patient Position: Sitting, Cuff Size: Large)   Pulse 84   Temp 97.8 F (36.6 C) (Oral)   Ht 5' 1.5" (1.562 m)   Wt 150 lb (68 kg)   SpO2 98%   BMI 27.88 kg/m   BP Readings from Last 3 Encounters:  11/06/17 132/66  09/04/17 138/84  08/11/17 140/88    Wt Readings from Last 3 Encounters:  11/06/17 150 lb (68 kg)  09/04/17 143 lb (64.9 kg)  08/11/17 139 lb (63 kg)    Physical Exam  Constitutional: She appears well-developed. No distress.  HENT:  Head: Normocephalic.  Right Ear: External ear normal.  Left Ear: External ear normal.  Nose: Nose normal.  Mouth/Throat: Oropharynx is clear and moist.  Eyes: Conjunctivae are normal. Pupils are equal, round, and reactive to light. Right eye exhibits no discharge. Left eye exhibits no discharge.  Neck: Normal range of  motion. Neck supple. No JVD present. No tracheal deviation present. No thyromegaly present.  Cardiovascular: Normal rate, regular rhythm and normal heart sounds.  Pulmonary/Chest: No stridor. No respiratory distress. She has no wheezes.  Abdominal: Soft. Bowel sounds are normal. She exhibits no distension and no mass. There is no tenderness. There is no rebound and no guarding.  Musculoskeletal: She exhibits no edema or tenderness.  Lymphadenopathy:    She has no cervical adenopathy.  Neurological: She displays normal  reflexes. No cranial nerve deficit. She exhibits normal muscle tone. Coordination normal.  Skin: No rash noted. No erythema.  Psychiatric: She has a normal mood and affect. Her behavior is normal. Judgment and thought content normal.    Lab Results  Component Value Date   WBC 7.5 06/02/2017   HGB 14.6 06/02/2017   HCT 44.8 06/02/2017   PLT 210.0 06/02/2017   GLUCOSE 122 (H) 06/02/2017   CHOL 243 (H) 06/02/2017   TRIG 119.0 06/02/2017   HDL 42.80 06/02/2017   LDLDIRECT 162.8 06/28/2011   LDLCALC 177 (H) 06/02/2017   ALT 17 06/02/2017   AST 28 06/02/2017   NA 140 06/02/2017   K 4.1 06/02/2017   CL 102 06/02/2017   CREATININE 0.75 06/02/2017   BUN 12 06/02/2017   CO2 32 06/02/2017   TSH 1.90 06/02/2017   HGBA1C 5.6 01/02/2008    No results found.  Assessment & Plan:   There are no diagnoses linked to this encounter. I am having Theresa Aguirre maintain her lipase/protease/amylase, aspirin, clobetasol cream, b complex vitamins, temazepam, roflumilast, ALPRAZolam, levalbuterol, VENTOLIN HFA, hydrochlorothiazide, and pantoprazole. We will continue to administer sodium chloride.  No orders of the defined types were placed in this encounter.    Follow-up: No Follow-up on file.  Walker Kehr, MD

## 2017-11-09 ENCOUNTER — Other Ambulatory Visit: Payer: Self-pay

## 2017-11-12 MED ORDER — TEMAZEPAM 15 MG PO CAPS: ORAL_CAPSULE | ORAL | 1 refills | Status: DC

## 2018-01-17 ENCOUNTER — Encounter: Payer: Self-pay | Admitting: Internal Medicine

## 2018-01-17 ENCOUNTER — Ambulatory Visit (INDEPENDENT_AMBULATORY_CARE_PROVIDER_SITE_OTHER): Payer: Medicare Other | Admitting: Internal Medicine

## 2018-01-17 DIAGNOSIS — F4323 Adjustment disorder with mixed anxiety and depressed mood: Secondary | ICD-10-CM | POA: Diagnosis not present

## 2018-01-17 DIAGNOSIS — M545 Low back pain: Secondary | ICD-10-CM

## 2018-01-17 DIAGNOSIS — J449 Chronic obstructive pulmonary disease, unspecified: Secondary | ICD-10-CM | POA: Diagnosis not present

## 2018-01-17 DIAGNOSIS — G8929 Other chronic pain: Secondary | ICD-10-CM

## 2018-01-17 MED ORDER — TEMAZEPAM 15 MG PO CAPS: ORAL_CAPSULE | ORAL | 1 refills | Status: DC

## 2018-01-17 NOTE — Assessment & Plan Note (Signed)
Pt stopped Lexapro Re-start if needed

## 2018-01-17 NOTE — Assessment & Plan Note (Signed)
Tylenol prn 

## 2018-01-17 NOTE — Assessment & Plan Note (Signed)
daliresp po

## 2018-01-17 NOTE — Progress Notes (Signed)
Subjective:  Patient ID: Theresa Aguirre, female    DOB: 1946/07/28  Age: 72 y.o. MRN: [id number]  CC: No chief complaint on file.   HPI Rondia Higginbotham presents for COPD, insomnia, anxiety f/u   Outpatient Medications Prior to Visit  Medication Sig Dispense Refill  . ALPRAZolam (XANAX) 1 MG tablet TAKE 1 TABLET BY MOUTH FOUR TIMES DAILY AS NEEDED FOR SLEEP OR ANXIETY 360 tablet 1  . aspirin (BAYER ASPIRIN) 325 MG tablet Take 0.5 tablets (162 mg total) by mouth daily. 100 tablet 3  . azithromycin (ZITHROMAX Z-PAK) 250 MG tablet As directed 6 each 0  . b complex vitamins tablet Take 1 tablet by mouth daily. 100 tablet 3  . clobetasol cream (TEMOVATE) 0.05 % APPLY TO AFFECTED AREA BID  3  . hydrochlorothiazide (MICROZIDE) 12.5 MG capsule Take 1 capsule (12.5 mg total) by mouth daily. 90 capsule 3  . levalbuterol (XOPENEX HFA) 45 MCG/ACT inhaler Inhale 2 puffs into the lungs every 6 (six) hours as needed for wheezing. 1 Inhaler 11  . lipase/protease/amylase (CREON) 36000 UNITS CPEP capsule Take 1 capsule (36,000 Units total) by mouth 3 (three) times daily before meals. 90 capsule 11  . pantoprazole (PROTONIX) 40 MG tablet Take 1 tablet (40 mg total) by mouth daily. 90 tablet 3  . predniSONE (DELTASONE) 10 MG tablet Prednisone 10 mg: take 4 tabs a day x 3 days; then 3 tabs a day x 4 days; then 2 tabs a day x 4 days, then 1 tab a day x 6 days, then stop. Take pc. 38 tablet 0  . roflumilast (DALIRESP) 500 MCG TABS tablet Take 1 tablet (500 mcg total) by mouth daily. 90 tablet 5  . VENTOLIN HFA 108 (90 Base) MCG/ACT inhaler Inhale 2 puffs into the lungs every 4 (four) hours as needed for wheezing or shortness of breath. 1 Inhaler 11  . temazepam (RESTORIL) 15 MG capsule TAKE 1 TO 2 CAPSULES BY MOUTH EVERY NIGHT AT BEDTIME AS NEEDED FOR INSOMNIA 180 capsule 1   Facility-Administered Medications Prior to Visit  Medication Dose Route Frequency Provider Last Rate Last Dose  . 0.9 %  sodium chloride  infusion  500 mL Intravenous Continuous Irene Shipper, MD        ROS Review of Systems  Constitutional: Negative for activity change, appetite change, chills, fatigue and unexpected weight change.  HENT: Positive for congestion. Negative for mouth sores and sinus pressure.   Eyes: Negative for visual disturbance.  Respiratory: Positive for wheezing. Negative for cough and chest tightness.   Gastrointestinal: Negative for abdominal pain and nausea.  Genitourinary: Negative for difficulty urinating, frequency and vaginal pain.  Musculoskeletal: Positive for back pain. Negative for gait problem.  Skin: Negative for pallor and rash.  Neurological: Negative for dizziness, tremors, weakness, numbness and headaches.  Psychiatric/Behavioral: Positive for dysphoric mood. Negative for confusion, sleep disturbance and suicidal ideas. The patient is nervous/anxious.     Objective:  BP 128/76 (BP Location: Left Arm, Patient Position: Sitting, Cuff Size: Normal)   Pulse 88   Temp 98.2 F (36.8 C) (Oral)   Ht 5' 1.5" (1.562 m)   Wt 148 lb (67.1 kg)   SpO2 99%   BMI 27.51 kg/m   BP Readings from Last 3 Encounters:  01/17/18 128/76  11/06/17 132/66  09/04/17 138/84    Wt Readings from Last 3 Encounters:  01/17/18 148 lb (67.1 kg)  11/06/17 150 lb (68 kg)  09/04/17 143 lb (64.9 kg)  Physical Exam  Constitutional: She appears well-developed. No distress.  HENT:  Head: Normocephalic.  Right Ear: External ear normal.  Left Ear: External ear normal.  Nose: Nose normal.  Mouth/Throat: Oropharynx is clear and moist.  Eyes: Pupils are equal, round, and reactive to light. Conjunctivae are normal. Right eye exhibits no discharge. Left eye exhibits no discharge.  Neck: Normal range of motion. Neck supple. No JVD present. No tracheal deviation present. No thyromegaly present.  Cardiovascular: Normal rate, regular rhythm and normal heart sounds.  Pulmonary/Chest: No stridor. No respiratory  distress. She has no wheezes.  Abdominal: Soft. Bowel sounds are normal. She exhibits no distension and no mass. There is no tenderness. There is no rebound and no guarding.  Musculoskeletal: She exhibits tenderness. She exhibits no edema.  Lymphadenopathy:    She has no cervical adenopathy.  Neurological: She displays normal reflexes. No cranial nerve deficit. She exhibits normal muscle tone. Coordination normal.  Skin: No rash noted. No erythema.  Psychiatric: She has a normal mood and affect. Her behavior is normal. Judgment and thought content normal.    Lab Results  Component Value Date   WBC 7.5 06/02/2017   HGB 14.6 06/02/2017   HCT 44.8 06/02/2017   PLT 210.0 06/02/2017   GLUCOSE 122 (H) 06/02/2017   CHOL 243 (H) 06/02/2017   TRIG 119.0 06/02/2017   HDL 42.80 06/02/2017   LDLDIRECT 162.8 06/28/2011   LDLCALC 177 (H) 06/02/2017   ALT 17 06/02/2017   AST 28 06/02/2017   NA 140 06/02/2017   K 4.1 06/02/2017   CL 102 06/02/2017   CREATININE 0.75 06/02/2017   BUN 12 06/02/2017   CO2 32 06/02/2017   TSH 1.90 06/02/2017   HGBA1C 5.6 01/02/2008    No results found.  Assessment & Plan:   Diagnoses and all orders for this visit:  COPD, moderate (HCC)  Chronic bilateral low back pain without sciatica  Other orders -     temazepam (RESTORIL) 15 MG capsule; TAKE 1 TO 2 CAPSULES BY MOUTH EVERY NIGHT AT BEDTIME AS NEEDED FOR INSOMNIA   I am having Theresa Aguirre maintain her lipase/protease/amylase, aspirin, clobetasol cream, b complex vitamins, roflumilast, ALPRAZolam, levalbuterol, VENTOLIN HFA, hydrochlorothiazide, pantoprazole, predniSONE, azithromycin, and temazepam. We will continue to administer sodium chloride.  Meds ordered this encounter  Medications  . temazepam (RESTORIL) 15 MG capsule    Sig: TAKE 1 TO 2 CAPSULES BY MOUTH EVERY NIGHT AT BEDTIME AS NEEDED FOR INSOMNIA    Dispense:  180 capsule    Refill:  1     Follow-up: Return in about 3 months  (around 04/18/2018) for a follow-up visit.  Walker Kehr, MD

## 2018-01-23 ENCOUNTER — Telehealth: Payer: Self-pay

## 2018-01-23 NOTE — Telephone Encounter (Signed)
Key: [id number]  Started PA today via Cover My Meds. Currently pending approval.

## 2018-02-01 ENCOUNTER — Telehealth: Payer: Self-pay | Admitting: Internal Medicine

## 2018-02-01 DIAGNOSIS — Z1211 Encounter for screening for malignant neoplasm of colon: Secondary | ICD-10-CM

## 2018-02-01 NOTE — Telephone Encounter (Signed)
Last Cologurad: 3/ 25/ 2015 LOV: 4/ 10/ 2019 Next appt: 4/ 30/ 2019 Would you like to reorder cologuard?

## 2018-02-01 NOTE — Telephone Encounter (Signed)
cologuard can be ordered if Dr. Alain Marion approves

## 2018-02-06 ENCOUNTER — Ambulatory Visit: Payer: Medicare Other | Admitting: Internal Medicine

## 2018-02-12 ENCOUNTER — Telehealth: Payer: Self-pay

## 2018-02-12 NOTE — Telephone Encounter (Signed)
PA started on CoverMyMeds KEY: [id number]

## 2018-02-14 NOTE — Telephone Encounter (Signed)
ordered

## 2018-02-27 NOTE — Telephone Encounter (Signed)
PA approved.

## 2018-02-27 NOTE — Telephone Encounter (Signed)
PA approved thought 10/09/18

## 2018-04-13 ENCOUNTER — Ambulatory Visit (INDEPENDENT_AMBULATORY_CARE_PROVIDER_SITE_OTHER): Payer: Medicare Other | Admitting: Internal Medicine

## 2018-04-13 ENCOUNTER — Encounter: Payer: Self-pay | Admitting: Internal Medicine

## 2018-04-13 DIAGNOSIS — R109 Unspecified abdominal pain: Secondary | ICD-10-CM | POA: Insufficient documentation

## 2018-04-13 DIAGNOSIS — R1084 Generalized abdominal pain: Secondary | ICD-10-CM | POA: Diagnosis not present

## 2018-04-13 DIAGNOSIS — F4323 Adjustment disorder with mixed anxiety and depressed mood: Secondary | ICD-10-CM

## 2018-04-13 DIAGNOSIS — J449 Chronic obstructive pulmonary disease, unspecified: Secondary | ICD-10-CM

## 2018-04-13 DIAGNOSIS — E785 Hyperlipidemia, unspecified: Secondary | ICD-10-CM | POA: Diagnosis not present

## 2018-04-13 NOTE — Patient Instructions (Addendum)
Hold Daliresp due to abdominal complaints

## 2018-04-13 NOTE — Assessment & Plan Note (Addendum)
Hold Daliresp due to abd complaints Abd CT if not better

## 2018-04-13 NOTE — Assessment & Plan Note (Signed)
Xanax prn  Potential benefits of a long term benzodiazepines  use as well as potential risks  and complications were explained to the patient and were aknowledged. 

## 2018-04-13 NOTE — Assessment & Plan Note (Signed)
Off statin  

## 2018-04-13 NOTE — Progress Notes (Signed)
Subjective:  Patient ID: Theresa Aguirre, female    DOB: [birth year]  Age: 72 y.o. MRN: [id number]  CC: No chief complaint on file.   HPI Kendria Halberg presents for anxiety, COPD, GERD f/u C/o abd pain - new, gas from food - lower abdomen, some LBP Smokes 5/day  Outpatient Medications Prior to Visit  Medication Sig Dispense Refill  . ALPRAZolam (XANAX) 1 MG tablet TAKE 1 TABLET BY MOUTH FOUR TIMES DAILY AS NEEDED FOR SLEEP OR ANXIETY 360 tablet 1  . aspirin (BAYER ASPIRIN) 325 MG tablet Take 0.5 tablets (162 mg total) by mouth daily. 100 tablet 3  . azithromycin (ZITHROMAX Z-PAK) 250 MG tablet As directed 6 each 0  . b complex vitamins tablet Take 1 tablet by mouth daily. 100 tablet 3  . clobetasol cream (TEMOVATE) 0.05 % APPLY TO AFFECTED AREA BID  3  . hydrochlorothiazide (MICROZIDE) 12.5 MG capsule Take 1 capsule (12.5 mg total) by mouth daily. 90 capsule 3  . levalbuterol (XOPENEX HFA) 45 MCG/ACT inhaler Inhale 2 puffs into the lungs every 6 (six) hours as needed for wheezing. 1 Inhaler 11  . lipase/protease/amylase (CREON) 36000 UNITS CPEP capsule Take 1 capsule (36,000 Units total) by mouth 3 (three) times daily before meals. 90 capsule 11  . pantoprazole (PROTONIX) 40 MG tablet Take 1 tablet (40 mg total) by mouth daily. 90 tablet 3  . predniSONE (DELTASONE) 10 MG tablet Prednisone 10 mg: take 4 tabs a day x 3 days; then 3 tabs a day x 4 days; then 2 tabs a day x 4 days, then 1 tab a day x 6 days, then stop. Take pc. 38 tablet 0  . roflumilast (DALIRESP) 500 MCG TABS tablet Take 1 tablet (500 mcg total) by mouth daily. 90 tablet 5  . temazepam (RESTORIL) 15 MG capsule TAKE 1 TO 2 CAPSULES BY MOUTH EVERY NIGHT AT BEDTIME AS NEEDED FOR INSOMNIA 180 capsule 1  . VENTOLIN HFA 108 (90 Base) MCG/ACT inhaler Inhale 2 puffs into the lungs every 4 (four) hours as needed for wheezing or shortness of breath. 1 Inhaler 11   Facility-Administered Medications Prior to Visit  Medication Dose  Route Frequency Provider Last Rate Last Dose  . 0.9 %  sodium chloride infusion  500 mL Intravenous Continuous Irene Shipper, MD        ROS: Review of Systems  Constitutional: Negative for activity change, appetite change, chills, fatigue and unexpected weight change.  HENT: Negative for congestion, mouth sores and sinus pressure.   Eyes: Negative for visual disturbance.  Respiratory: Negative for cough and chest tightness.   Gastrointestinal: Positive for abdominal distention, abdominal pain and nausea.  Genitourinary: Negative for difficulty urinating, frequency and vaginal pain.  Musculoskeletal: Positive for arthralgias and back pain. Negative for gait problem.  Skin: Negative for pallor and rash.  Neurological: Negative for dizziness, tremors, weakness, numbness and headaches.  Psychiatric/Behavioral: Positive for sleep disturbance. Negative for confusion. The patient is nervous/anxious.     Objective:  BP (!) 148/90 (BP Location: Left Arm, Patient Position: Sitting, Cuff Size: Normal)   Pulse 79   Temp 97.8 F (36.6 C) (Oral)   Ht 5' 1.5" (1.562 m)   Wt 150 lb (68 kg)   SpO2 98%   BMI 27.88 kg/m   BP Readings from Last 3 Encounters:  04/13/18 (!) 148/90  01/17/18 128/76  11/06/17 132/66    Wt Readings from Last 3 Encounters:  04/13/18 150 lb (68 kg)  01/17/18 148 lb (  67.1 kg)  11/06/17 150 lb (68 kg)    Physical Exam  Constitutional: She appears well-developed. No distress.  HENT:  Head: Normocephalic.  Right Ear: External ear normal.  Left Ear: External ear normal.  Nose: Nose normal.  Mouth/Throat: Oropharynx is clear and moist.  Eyes: Pupils are equal, round, and reactive to light. Conjunctivae are normal. Right eye exhibits no discharge. Left eye exhibits no discharge.  Neck: Normal range of motion. Neck supple. No JVD present. No tracheal deviation present. No thyromegaly present.  Cardiovascular: Normal rate, regular rhythm and normal heart sounds.    Pulmonary/Chest: No stridor. No respiratory distress. She has wheezes.  Abdominal: Soft. Bowel sounds are normal. She exhibits no distension and no mass. There is no tenderness. There is no rebound and no guarding. No hernia.  Musculoskeletal: She exhibits tenderness. She exhibits no edema.  Lymphadenopathy:    She has no cervical adenopathy.  Neurological: She displays normal reflexes. No cranial nerve deficit. She exhibits normal muscle tone. Coordination normal.  Skin: No rash noted. No erythema.  Psychiatric: Her behavior is normal. Judgment and thought content normal.  mild rhonchi  Lab Results  Component Value Date   WBC 7.5 06/02/2017   HGB 14.6 06/02/2017   HCT 44.8 06/02/2017   PLT 210.0 06/02/2017   GLUCOSE 122 (H) 06/02/2017   CHOL 243 (H) 06/02/2017   TRIG 119.0 06/02/2017   HDL 42.80 06/02/2017   LDLDIRECT 162.8 06/28/2011   LDLCALC 177 (H) 06/02/2017   ALT 17 06/02/2017   AST 28 06/02/2017   NA 140 06/02/2017   K 4.1 06/02/2017   CL 102 06/02/2017   CREATININE 0.75 06/02/2017   BUN 12 06/02/2017   CO2 32 06/02/2017   TSH 1.90 06/02/2017   HGBA1C 5.6 01/02/2008    No results found.  Assessment & Plan:   There are no diagnoses linked to this encounter.   No orders of the defined types were placed in this encounter.    Follow-up: No follow-ups on file.  Walker Kehr, MD

## 2018-04-13 NOTE — Assessment & Plan Note (Addendum)
Hold Daliresp due to abd complaints Xopenex

## 2018-04-17 DIAGNOSIS — Z1211 Encounter for screening for malignant neoplasm of colon: Secondary | ICD-10-CM | POA: Diagnosis not present

## 2018-04-17 DIAGNOSIS — Z1212 Encounter for screening for malignant neoplasm of rectum: Secondary | ICD-10-CM | POA: Diagnosis not present

## 2018-04-17 LAB — COLOGUARD

## 2018-04-26 ENCOUNTER — Telehealth: Payer: Self-pay | Admitting: Internal Medicine

## 2018-04-26 DIAGNOSIS — R195 Other fecal abnormalities: Secondary | ICD-10-CM

## 2018-04-26 NOTE — Telephone Encounter (Signed)
Noted. Thx.

## 2018-04-26 NOTE — Telephone Encounter (Signed)
Copied from [place] [id number]. Topic: Quick Communication - See Telephone Encounter >> Apr 26, 2018 10:25 AM Ahmed Prima L wrote: CRM for notification. See Telephone encounter for: 04/26/18.  [employer] would like to know if the office received the abnormal cologuard results that were sent yesterday. Please advise. [phone] reference number [id number]

## 2018-04-26 NOTE — Telephone Encounter (Signed)
FYI  Pt notified and referral to gastro made

## 2018-05-02 ENCOUNTER — Ambulatory Visit (INDEPENDENT_AMBULATORY_CARE_PROVIDER_SITE_OTHER): Payer: Medicare Other | Admitting: Internal Medicine

## 2018-05-02 ENCOUNTER — Other Ambulatory Visit (INDEPENDENT_AMBULATORY_CARE_PROVIDER_SITE_OTHER): Payer: Medicare Other

## 2018-05-02 ENCOUNTER — Encounter: Payer: Self-pay | Admitting: Internal Medicine

## 2018-05-02 DIAGNOSIS — R195 Other fecal abnormalities: Secondary | ICD-10-CM | POA: Insufficient documentation

## 2018-05-02 DIAGNOSIS — R1084 Generalized abdominal pain: Secondary | ICD-10-CM

## 2018-05-02 DIAGNOSIS — F331 Major depressive disorder, recurrent, moderate: Secondary | ICD-10-CM

## 2018-05-02 DIAGNOSIS — J449 Chronic obstructive pulmonary disease, unspecified: Secondary | ICD-10-CM | POA: Diagnosis not present

## 2018-05-02 DIAGNOSIS — E785 Hyperlipidemia, unspecified: Secondary | ICD-10-CM

## 2018-05-02 LAB — CBC WITH DIFFERENTIAL/PLATELET
Basophils Absolute: 0.1 10*3/uL (ref 0.0–0.1)
Basophils Relative: 1.2 % (ref 0.0–3.0)
EOS PCT: 5.8 % — AB (ref 0.0–5.0)
Eosinophils Absolute: 0.4 10*3/uL (ref 0.0–0.7)
HEMATOCRIT: 40.4 % (ref 36.0–46.0)
HEMOGLOBIN: 13.4 g/dL (ref 12.0–15.0)
LYMPHS PCT: 40.7 % (ref 12.0–46.0)
Lymphs Abs: 3 10*3/uL (ref 0.7–4.0)
MCHC: 33.3 g/dL (ref 30.0–36.0)
MCV: 80 fl (ref 78.0–100.0)
MONO ABS: 0.7 10*3/uL (ref 0.1–1.0)
MONOS PCT: 8.9 % (ref 3.0–12.0)
Neutro Abs: 3.2 10*3/uL (ref 1.4–7.7)
Neutrophils Relative %: 43.4 % (ref 43.0–77.0)
Platelets: 200 10*3/uL (ref 150.0–400.0)
RBC: 5.04 Mil/uL (ref 3.87–5.11)
RDW: 15 % (ref 11.5–15.5)
WBC: 7.4 10*3/uL (ref 4.0–10.5)

## 2018-05-02 LAB — URINALYSIS
BILIRUBIN URINE: NEGATIVE
Hgb urine dipstick: NEGATIVE
Ketones, ur: NEGATIVE
Leukocytes, UA: NEGATIVE
NITRITE: NEGATIVE
TOTAL PROTEIN, URINE-UPE24: NEGATIVE
URINE GLUCOSE: NEGATIVE
Urobilinogen, UA: 0.2 (ref 0.0–1.0)
pH: 6.5 (ref 5.0–8.0)

## 2018-05-02 LAB — HEPATIC FUNCTION PANEL
ALT: 22 U/L (ref 0–35)
AST: 26 U/L (ref 0–37)
Albumin: 4.5 g/dL (ref 3.5–5.2)
Alkaline Phosphatase: 76 U/L (ref 39–117)
BILIRUBIN TOTAL: 0.5 mg/dL (ref 0.2–1.2)
Bilirubin, Direct: 0.1 mg/dL (ref 0.0–0.3)
Total Protein: 7.5 g/dL (ref 6.0–8.3)

## 2018-05-02 LAB — BASIC METABOLIC PANEL
BUN: 9 mg/dL (ref 6–23)
CHLORIDE: 103 meq/L (ref 96–112)
CO2: 30 mEq/L (ref 19–32)
Calcium: 9.6 mg/dL (ref 8.4–10.5)
Creatinine, Ser: 0.68 mg/dL (ref 0.40–1.20)
GFR: 90.22 mL/min (ref >60.00)
Glucose, Bld: 100 mg/dL — ABNORMAL HIGH (ref 70–99)
POTASSIUM: 3.6 meq/L (ref 3.5–5.1)
SODIUM: 141 meq/L (ref 135–145)

## 2018-05-02 LAB — TSH: TSH: 1.69 u[IU]/mL (ref 0.35–4.50)

## 2018-05-02 MED ORDER — VORTIOXETINE HBR 10 MG PO TABS: 10.0000 mg | ORAL_TABLET | Freq: Every day | ORAL | 5 refills | Status: DC

## 2018-05-02 MED ORDER — PREDNISONE 10 MG PO TABS: ORAL_TABLET | ORAL | 2 refills | Status: DC

## 2018-05-02 MED ORDER — BUDESONIDE-FORMOTEROL FUMARATE 80-4.5 MCG/ACT IN AERO: 2.0000 | INHALATION_SPRAY | Freq: Two times a day (BID) | RESPIRATORY_TRACT | 3 refills | Status: DC

## 2018-05-02 NOTE — Assessment & Plan Note (Signed)
Symbicort Proventil prn Daliresp - try qod Labs

## 2018-05-02 NOTE — Patient Instructions (Signed)
[place]    3.7 [phone])  Medical supply store  [address]  [phone]  Open ? Closes [employer]  5.0 [phone])  Medical supply store  [place] [address]  [phone]  Open ? Closes 5:30PM   [place]  3.7 [phone])  Medical supply store  [address]  [phone]  Open ? Closes [facility]  2.7 (23)  Medical supply store  [place]  [phone]  Open ? Closes 5PM

## 2018-05-02 NOTE — Assessment & Plan Note (Signed)
Appt w/Dr Henrene Pastor

## 2018-05-02 NOTE — Assessment & Plan Note (Signed)
Try Trintellix - low dose

## 2018-05-02 NOTE — Progress Notes (Signed)
Subjective:  Patient ID: Theresa Aguirre, female    DOB: [birth year]  Age: 72 y.o. MRN: [id number]  CC: No chief complaint on file.   HPI Theresa Aguirre presents for abn cologuard test  Abd pain is better off Daliresp, constipation is worse  C/o depression   Outpatient Medications Prior to Visit  Medication Sig Dispense Refill  . ALPRAZolam (XANAX) 1 MG tablet TAKE 1 TABLET BY MOUTH FOUR TIMES DAILY AS NEEDED FOR SLEEP OR ANXIETY 360 tablet 1  . aspirin (BAYER ASPIRIN) 325 MG tablet Take 0.5 tablets (162 mg total) by mouth daily. 100 tablet 3  . azithromycin (ZITHROMAX Z-PAK) 250 MG tablet As directed 6 each 0  . b complex vitamins tablet Take 1 tablet by mouth daily. 100 tablet 3  . clobetasol cream (TEMOVATE) 0.05 % APPLY TO AFFECTED AREA BID  3  . hydrochlorothiazide (MICROZIDE) 12.5 MG capsule Take 1 capsule (12.5 mg total) by mouth daily. 90 capsule 3  . levalbuterol (XOPENEX HFA) 45 MCG/ACT inhaler Inhale 2 puffs into the lungs every 6 (six) hours as needed for wheezing. 1 Inhaler 11  . lipase/protease/amylase (CREON) 36000 UNITS CPEP capsule Take 1 capsule (36,000 Units total) by mouth 3 (three) times daily before meals. 90 capsule 11  . pantoprazole (PROTONIX) 40 MG tablet Take 1 tablet (40 mg total) by mouth daily. 90 tablet 3  . predniSONE (DELTASONE) 10 MG tablet Prednisone 10 mg: take 4 tabs a day x 3 days; then 3 tabs a day x 4 days; then 2 tabs a day x 4 days, then 1 tab a day x 6 days, then stop. Take pc. 38 tablet 0  . roflumilast (DALIRESP) 500 MCG TABS tablet Take 1 tablet (500 mcg total) by mouth daily. 90 tablet 5  . temazepam (RESTORIL) 15 MG capsule TAKE 1 TO 2 CAPSULES BY MOUTH EVERY NIGHT AT BEDTIME AS NEEDED FOR INSOMNIA 180 capsule 1   Facility-Administered Medications Prior to Visit  Medication Dose Route Frequency Provider Last Rate Last Dose  . 0.9 %  sodium chloride infusion  500 mL Intravenous Continuous Irene Shipper, MD        ROS: Review of Systems   Constitutional: Positive for fatigue. Negative for activity change, appetite change, chills and unexpected weight change.  HENT: Negative for congestion, mouth sores and sinus pressure.   Eyes: Negative for visual disturbance.  Respiratory: Positive for shortness of breath and wheezing. Negative for cough and chest tightness.   Cardiovascular: Negative for chest pain, palpitations and leg swelling.  Gastrointestinal: Negative for abdominal pain and nausea.  Genitourinary: Negative for difficulty urinating, frequency and vaginal pain.  Musculoskeletal: Positive for arthralgias and back pain. Negative for gait problem.  Skin: Negative for pallor and rash.  Neurological: Negative for dizziness, tremors, weakness, numbness and headaches.  Psychiatric/Behavioral: Positive for dysphoric mood. Negative for confusion, sleep disturbance and suicidal ideas. The patient is nervous/anxious.     Objective:  BP 134/82 (BP Location: Left Arm, Patient Position: Sitting, Cuff Size: Normal)   Pulse 93   Temp 98.1 F (36.7 C) (Oral)   Ht 5' 1.5" (1.562 m)   Wt 156 lb (70.8 kg)   SpO2 97%   BMI 29.00 kg/m   BP Readings from Last 3 Encounters:  05/02/18 134/82  04/13/18 (!) 148/90  01/17/18 128/76    Wt Readings from Last 3 Encounters:  05/02/18 156 lb (70.8 kg)  04/13/18 150 lb (68 kg)  01/17/18 148 lb (67.1 kg)  Physical Exam  Constitutional: She appears well-developed. No distress.  HENT:  Head: Normocephalic.  Right Ear: External ear normal.  Left Ear: External ear normal.  Nose: Nose normal.  Mouth/Throat: Oropharynx is clear and moist.  Eyes: Pupils are equal, round, and reactive to light. Conjunctivae are normal. Right eye exhibits no discharge. Left eye exhibits no discharge.  Neck: Normal range of motion. Neck supple. No JVD present. No tracheal deviation present. No thyromegaly present.  Cardiovascular: Normal rate, regular rhythm and normal heart sounds.  Pulmonary/Chest: No  stridor. No respiratory distress. She has wheezes.  Abdominal: Soft. Bowel sounds are normal. She exhibits no distension and no mass. There is no tenderness. There is no rebound and no guarding.  Musculoskeletal: She exhibits no edema or tenderness.  Lymphadenopathy:    She has no cervical adenopathy.  Neurological: She displays normal reflexes. No cranial nerve deficit. She exhibits normal muscle tone. Coordination normal.  Skin: No rash noted. No erythema.  Psychiatric: She has a normal mood and affect. Her behavior is normal. Judgment and thought content normal.    Lab Results  Component Value Date   WBC 7.5 06/02/2017   HGB 14.6 06/02/2017   HCT 44.8 06/02/2017   PLT 210.0 06/02/2017   GLUCOSE 122 (H) 06/02/2017   CHOL 243 (H) 06/02/2017   TRIG 119.0 06/02/2017   HDL 42.80 06/02/2017   LDLDIRECT 162.8 06/28/2011   LDLCALC 177 (H) 06/02/2017   ALT 17 06/02/2017   AST 28 06/02/2017   NA 140 06/02/2017   K 4.1 06/02/2017   CL 102 06/02/2017   CREATININE 0.75 06/02/2017   BUN 12 06/02/2017   CO2 32 06/02/2017   TSH 1.90 06/02/2017   HGBA1C 5.6 01/02/2008    No results found.  Assessment & Plan:   There are no diagnoses linked to this encounter.   No orders of the defined types were placed in this encounter.    Follow-up: No follow-ups on file.  Walker Kehr, MD

## 2018-05-02 NOTE — Assessment & Plan Note (Signed)
Labs Daliresp qod

## 2018-05-02 NOTE — Assessment & Plan Note (Signed)
Declined statins. 

## 2018-06-12 ENCOUNTER — Ambulatory Visit: Payer: Medicare Other | Admitting: Internal Medicine

## 2018-06-13 ENCOUNTER — Ambulatory Visit (AMBULATORY_SURGERY_CENTER): Payer: Self-pay | Admitting: *Deleted

## 2018-06-13 ENCOUNTER — Encounter

## 2018-06-13 VITALS — Ht 61.5 in | Wt 151.0 lb

## 2018-06-13 DIAGNOSIS — R195 Other fecal abnormalities: Secondary | ICD-10-CM

## 2018-06-13 MED ORDER — NA SULFATE-K SULFATE-MG SULF 17.5-3.13-1.6 GM/177ML PO SOLN: ORAL | 0 refills | Status: DC

## 2018-06-13 NOTE — Progress Notes (Signed)
Patient denies any allergies to eggs or soy. Patient denies any problems with anesthesia/sedation. Patient denies any oxygen use at home. Patient denies taking any diet/weight loss medications or blood thinners.  

## 2018-06-19 ENCOUNTER — Encounter: Payer: Self-pay | Admitting: Internal Medicine

## 2018-07-02 ENCOUNTER — Encounter: Payer: Medicare Other | Admitting: Internal Medicine

## 2018-07-02 ENCOUNTER — Telehealth: Payer: Self-pay | Admitting: *Deleted

## 2018-07-02 ENCOUNTER — Telehealth: Payer: Self-pay | Admitting: Internal Medicine

## 2018-07-02 ENCOUNTER — Telehealth: Payer: Self-pay | Admitting: Gastroenterology

## 2018-07-02 NOTE — Telephone Encounter (Signed)
On-call note  Patient called last night twice, first around 9:30 PM 07/01/18 complaining that she vomited the first half of Suprep.  She has not started having bowel movements. Advised patient to do MiraLAX with Gatorade bowel prep and do the second half of split dose of bowel prep in the morning at 3 AM as per recommendation. She called back around 12:30 AM 07/02/18 complaining that she is feeling full, feels like her stomach is going to burst.  She has not had bowel movements yet.  She does not think she can continue to drink the bowel prep.  Encouraged patient to restart drinking at 3 AM if she is able to tolerate to complete the bowel prep.  If she is unable to do the prep to call back after 8 AM to reschedule the procedure, will need additional prep instructions for possible extended bowel prep.  Damaris Hippo , MD [phone]

## 2018-07-02 NOTE — Telephone Encounter (Signed)
Called patient back regarding her not tolerating her prep. She vomited the suprep last night and reached out to the on call physician who instructed her to take miralax instead. Pt became hypertensive Bp 180/90 and she was unable to tolerate the miralax with vomiting as well. I spoke with Dr. Henrene Pastor and he said we should cancel the patient for today and reschedule. I then called the patient back and explained this to her and she agreed that it would be better to reschedule since she was not feeling well today. Sm

## 2018-07-03 NOTE — Telephone Encounter (Signed)
Previsit and colon rescheduled, pt aware.

## 2018-07-03 NOTE — Telephone Encounter (Signed)
See additional phone note. 

## 2018-07-03 NOTE — Telephone Encounter (Signed)
Wants to know what to do next since she was unable to tolerate the prep. Wants to be called back to her cell phone # [phone].

## 2018-07-03 NOTE — Telephone Encounter (Signed)
Theresa Aguirre,  The patient had trouble with her prep. Lets try the following: 1.Plenvu (instead of Suprep) 2.Drink each session over 90 minutes (instead of 60) 3.Reglan 10 mg 15 min prior to each session 4.Zofran 4 mg q 2 hours as needed for nausea. Dispense # 4  Thanks, JNP

## 2018-07-19 ENCOUNTER — Ambulatory Visit (AMBULATORY_SURGERY_CENTER): Payer: Self-pay

## 2018-07-19 ENCOUNTER — Encounter: Payer: Self-pay | Admitting: Internal Medicine

## 2018-07-19 ENCOUNTER — Other Ambulatory Visit: Payer: Self-pay

## 2018-07-19 VITALS — Ht 61.0 in | Wt 150.4 lb

## 2018-07-19 DIAGNOSIS — R110 Nausea: Secondary | ICD-10-CM

## 2018-07-19 MED ORDER — ONDANSETRON HCL 4 MG PO TABS: 4.0000 mg | ORAL_TABLET | ORAL | 0 refills | Status: DC | PRN

## 2018-07-19 MED ORDER — METOCLOPRAMIDE HCL 10 MG PO TABS: 10.0000 mg | ORAL_TABLET | Freq: Once | ORAL | 0 refills | Status: DC

## 2018-07-19 NOTE — Progress Notes (Signed)
No egg or soy allergy known to patient  No issues with past sedation with any surgeries  or procedures, no intubation problems  No diet pills per patient No home 02 use per patient  No blood thinners per patient  Pt denies issues with constipation  No A fib or A flutter  EMMI video sent to pt's e mail  

## 2018-07-25 ENCOUNTER — Ambulatory Visit (AMBULATORY_SURGERY_CENTER): Payer: Medicare Other | Admitting: Internal Medicine

## 2018-07-25 ENCOUNTER — Encounter: Payer: Self-pay | Admitting: Internal Medicine

## 2018-07-25 VITALS — BP 133/76 | HR 80 | Temp 98.2°F | Resp 15 | Ht 61.0 in | Wt 156.0 lb

## 2018-07-25 DIAGNOSIS — K5730 Diverticulosis of large intestine without perforation or abscess without bleeding: Secondary | ICD-10-CM

## 2018-07-25 DIAGNOSIS — R195 Other fecal abnormalities: Secondary | ICD-10-CM

## 2018-07-25 DIAGNOSIS — K648 Other hemorrhoids: Secondary | ICD-10-CM

## 2018-07-25 DIAGNOSIS — Z1211 Encounter for screening for malignant neoplasm of colon: Secondary | ICD-10-CM | POA: Diagnosis not present

## 2018-07-25 MED ORDER — SODIUM CHLORIDE 0.9 % IV SOLN: 500.0000 mL | Freq: Once | INTRAVENOUS | Status: DC

## 2018-07-25 NOTE — Progress Notes (Signed)
A and O x3. Report to RN. Tolerated MAC anesthesia well.

## 2018-07-25 NOTE — Patient Instructions (Signed)
YOU HAD AN ENDOSCOPIC PROCEDURE TODAY AT [facility]:   Refer to the procedure report that was given to you for any specific questions about what was found during the examination.  If the procedure report does not answer your questions, please call your gastroenterologist to clarify.  If you requested that your care partner not be given the details of your procedure findings, then the procedure report has been included in a sealed envelope for you to review at your convenience later.  YOU SHOULD EXPECT: Some feelings of bloating in the abdomen. Passage of more gas than usual.  Walking can help get rid of the air that was put into your GI tract during the procedure and reduce the bloating. If you had a lower endoscopy (such as a colonoscopy or flexible sigmoidoscopy) you may notice spotting of blood in your stool or on the toilet paper. If you underwent a bowel prep for your procedure, you may not have a normal bowel movement for a few days.  Please Note:  You might notice some irritation and congestion in your nose or some drainage.  This is from the oxygen used during your procedure.  There is no need for concern and it should clear up in a day or so.  SYMPTOMS TO REPORT IMMEDIATELY:   Following lower endoscopy (colonoscopy or flexible sigmoidoscopy):  Excessive amounts of blood in the stool  Significant tenderness or worsening of abdominal pains  Swelling of the abdomen that is new, acute  Fever of 100F or higher   Following upper endoscopy (EGD)  Vomiting of blood or coffee ground material  New chest pain or pain under the shoulder blades  Painful or persistently difficult swallowing  New shortness of breath  Fever of 100F or higher  Black, tarry-looking stools  For urgent or emergent issues, a gastroenterologist can be reached at any hour by calling [phone].   DIET:  We do recommend a small meal at first, but then you may proceed to your regular diet.  Drink  plenty of fluids but you should avoid alcoholic beverages for 24 hours.  ACTIVITY:  You should plan to take it easy for the rest of today and you should NOT DRIVE or use heavy machinery until tomorrow (because of the sedation medicines used during the test).    FOLLOW UP: Our staff will call the number listed on your records the next business day following your procedure to check on you and address any questions or concerns that you may have regarding the information given to you following your procedure. If we do not reach you, we will leave a message.  However, if you are feeling well and you are not experiencing any problems, there is no need to return our call.  We will assume that you have returned to your regular daily activities without incident.  If any biopsies were taken you will be contacted by phone or by letter within the next 1-3 weeks.  Please call us at [phone] if you have not heard about the biopsies in 3 weeks.    SIGNATURES/CONFIDENTIALITY: You and/or your care partner have signed paperwork which will be entered into your electronic medical record.  These signatures attest to the fact that that the information above on your After Visit Summary has been reviewed and is understood.  Full responsibility of the confidentiality of this discharge information lies with you and/or your care-partner.  Diverticulosis, and hemorrhoid information given.

## 2018-07-25 NOTE — Progress Notes (Signed)
Pt's states no medical or surgical changes since previsit or office visit. 

## 2018-07-25 NOTE — Op Note (Signed)
[facility] Patient Name: Theresa Aguirre Procedure Date: 07/25/2018 2:14 PM MRN: [id number] Endoscopist: Docia Chuck. Henrene Pastor , MD Age: 72 Referring MD:  Date of Birth: 04/13/1946 Gender: Female Account #: [id number] Procedure:                Colonoscopy Indications:              Positive Cologuard test Medicines:                Monitored Anesthesia Care Procedure:                Pre-Anesthesia Assessment:                           - Prior to the procedure, a History and Physical                            was performed, and patient medications and                            allergies were reviewed. The patient's tolerance of                            previous anesthesia was also reviewed. The risks                            and benefits of the procedure and the sedation                            options and risks were discussed with the patient.                            All questions were answered, and informed consent                            was obtained. Prior Anticoagulants: The patient has                            taken no previous anticoagulant or antiplatelet                            agents. ASA Grade Assessment: II - A patient with                            mild systemic disease. After reviewing the risks                            and benefits, the patient was deemed in                            satisfactory condition to undergo the procedure.                           After obtaining informed consent, the colonoscope  was passed under direct vision. Throughout the                            procedure, the patient's blood pressure, pulse, and                            oxygen saturations were monitored continuously. The                            Colonoscope was introduced through the anus and                            advanced to the the cecum, identified by                            appendiceal orifice and ileocecal valve. The                             ileocecal valve, appendiceal orifice, and rectum                            were photographed. The quality of the bowel                            preparation was excellent. The colonoscopy was                            performed without difficulty. The patient tolerated                            the procedure well. The bowel preparation used was                            SUPREP. Scope In: 2:25:02 PM Scope Out: 2:39:52 PM Scope Withdrawal Time: 0 hours 12 minutes 10 seconds  Total Procedure Duration: 0 hours 14 minutes 50 seconds  Findings:                 A few small-mouthed diverticula were found in the                            sigmoid colon and transverse colon.                           Internal hemorrhoids were found during retroflexion.                           The exam was otherwise without abnormality on                            direct and retroflexion views. Complications:            No immediate complications. Estimated blood loss:                            None. Estimated  Blood Loss:     Estimated blood loss: none. Impression:               - Diverticulosis in the sigmoid colon and in the                            transverse colon.                           - Internal hemorrhoids.                           - The examination was otherwise normal on direct                            and retroflexion views.                           - No specimens collected. Recommendation:           - Repeat colonoscopy is not recommended due to                            current age (39 years or older) for surveillance.                           - Patient has a contact number available for                            emergencies. The signs and symptoms of potential                            delayed complications were discussed with the                            patient. Return to normal activities tomorrow.                            Written discharge  instructions were provided to the                            patient.                           - Resume previous diet.                           - Continue present medications. Docia Chuck. Henrene Pastor, MD 07/25/2018 2:44:15 PM This report has been signed electronically.

## 2018-07-26 ENCOUNTER — Telehealth: Payer: Self-pay

## 2018-07-26 NOTE — Telephone Encounter (Signed)
  Follow up Call-  Call back number 07/25/2018 07/13/2016  Post procedure Call Back phone  # [phone]  Permission to leave phone message Yes Yes  Some recent data might be hidden     Patient questions:  Do you have a fever, pain , or abdominal swelling? No. Pain Score  0 *  Have you tolerated food without any problems? Yes.    Have you been able to return to your normal activities? Yes.    Do you have any questions about your discharge instructions: Diet   No. Medications  No. Follow up visit  No.  Do you have questions or concerns about your Care? No.  Actions: * If pain score is 4 or above: No action needed, pain <4.

## 2018-08-15 ENCOUNTER — Other Ambulatory Visit: Payer: Self-pay

## 2018-08-16 MED ORDER — TEMAZEPAM 15 MG PO CAPS: ORAL_CAPSULE | ORAL | 1 refills | Status: DC

## 2018-08-27 ENCOUNTER — Other Ambulatory Visit: Payer: Self-pay | Admitting: Internal Medicine

## 2018-08-27 DIAGNOSIS — I6523 Occlusion and stenosis of bilateral carotid arteries: Secondary | ICD-10-CM

## 2018-09-03 ENCOUNTER — Other Ambulatory Visit: Payer: Self-pay

## 2018-09-03 MED ORDER — LEVALBUTEROL TARTRATE 45 MCG/ACT IN AERO: 2.0000 | INHALATION_SPRAY | Freq: Four times a day (QID) | RESPIRATORY_TRACT | 11 refills | Status: DC | PRN

## 2018-09-04 NOTE — Addendum Note (Signed)
Addended by: Karren Cobble on: 09/04/2018 08:31 AM   Modules accepted: Orders

## 2018-09-13 ENCOUNTER — Ambulatory Visit (HOSPITAL_COMMUNITY)
Admission: RE | Admit: 2018-09-13 | Discharge: 2018-09-13 | Disposition: A | Discharge status: Home / Self Care | Payer: Medicare Other | Source: Ambulatory Visit | Attending: Cardiovascular Disease | Admitting: Cardiovascular Disease

## 2018-09-13 DIAGNOSIS — I6523 Occlusion and stenosis of bilateral carotid arteries: Secondary | ICD-10-CM

## 2018-09-14 ENCOUNTER — Telehealth: Payer: Self-pay

## 2018-09-14 NOTE — Telephone Encounter (Signed)
Approved today Request Reference Number: [id number]. LEVALBUTEROL AER 45/ACT is approved through 10/10/2019. For further questions, call [phone].

## 2018-09-14 NOTE — Telephone Encounter (Signed)
Key: Hoyt Koch

## 2018-10-04 ENCOUNTER — Ambulatory Visit (INDEPENDENT_AMBULATORY_CARE_PROVIDER_SITE_OTHER): Payer: Medicare Other | Admitting: Internal Medicine

## 2018-10-04 ENCOUNTER — Encounter: Payer: Self-pay | Admitting: Internal Medicine

## 2018-10-04 VITALS — BP 138/72 | HR 79 | Temp 97.7°F | Ht 61.0 in | Wt 152.0 lb

## 2018-10-04 DIAGNOSIS — F331 Major depressive disorder, recurrent, moderate: Secondary | ICD-10-CM

## 2018-10-04 DIAGNOSIS — Z23 Encounter for immunization: Secondary | ICD-10-CM

## 2018-10-04 DIAGNOSIS — E785 Hyperlipidemia, unspecified: Secondary | ICD-10-CM | POA: Diagnosis not present

## 2018-10-04 DIAGNOSIS — R1084 Generalized abdominal pain: Secondary | ICD-10-CM

## 2018-10-04 MED ORDER — VORTIOXETINE HBR 10 MG PO TABS: 10.0000 mg | ORAL_TABLET | Freq: Every day | ORAL | 5 refills | Status: DC

## 2018-10-04 NOTE — Assessment & Plan Note (Signed)
Better  

## 2018-10-04 NOTE — Progress Notes (Signed)
Subjective:  Patient ID: Theresa Aguirre, female    DOB: 02-Apr-1946  Age: 72 y.o. MRN: [id number]  CC: No chief complaint on file.   HPI Theresa Aguirre presents for COPD - better on Daliresp, depression -better on trintellix, insomnia f/u Smoking very little  Outpatient Medications Prior to Visit  Medication Sig Dispense Refill  . ALPRAZolam (XANAX) 1 MG tablet TAKE 1 TABLET BY MOUTH FOUR TIMES DAILY AS NEEDED FOR SLEEP OR ANXIETY 360 tablet 1  . aspirin (BAYER ASPIRIN) 325 MG tablet Take 0.5 tablets (162 mg total) by mouth daily. 100 tablet 3  . b complex vitamins tablet Take 1 tablet by mouth daily. 100 tablet 3  . levalbuterol (XOPENEX HFA) 45 MCG/ACT inhaler Inhale 2 puffs into the lungs every 6 (six) hours as needed for wheezing. 1 Inhaler 11  . lipase/protease/amylase (CREON) 36000 UNITS CPEP capsule Take 1 capsule (36,000 Units total) by mouth 3 (three) times daily before meals. 90 capsule 11  . ondansetron (ZOFRAN) 4 MG tablet Take 1 tablet (4 mg total) by mouth every 2 (two) hours as needed for nausea or vomiting. 4 tablet 0  . pantoprazole (PROTONIX) 40 MG tablet Take 1 tablet (40 mg total) by mouth daily. 90 tablet 3  . roflumilast (DALIRESP) 500 MCG TABS tablet Take 1 tablet (500 mcg total) by mouth daily. 90 tablet 5  . Simethicone (GAS-X PO) Take by mouth.    . temazepam (RESTORIL) 15 MG capsule TAKE 1 TO 2 CAPSULES BY MOUTH EVERY NIGHT AT BEDTIME AS NEEDED FOR INSOMNIA 180 capsule 1  . vortioxetine HBr (TRINTELLIX) 10 MG TABS tablet Take 1 tablet (10 mg total) by mouth daily. 30 tablet 5  . metoCLOPramide (REGLAN) 10 MG tablet Take 1 tablet (10 mg total) by mouth once for 1 dose. 2 tablet 0   No facility-administered medications prior to visit.     ROS: Review of Systems  Constitutional: Negative for activity change, appetite change, chills, fatigue and unexpected weight change.  HENT: Negative for congestion, mouth sores and sinus pressure.   Eyes: Negative for visual  disturbance.  Respiratory: Positive for wheezing. Negative for cough, chest tightness and shortness of breath.   Gastrointestinal: Negative for abdominal pain and nausea.  Genitourinary: Negative for difficulty urinating, frequency and vaginal pain.  Musculoskeletal: Negative for back pain and gait problem.  Skin: Negative for pallor and rash.  Neurological: Negative for dizziness, tremors, weakness, numbness and headaches.  Psychiatric/Behavioral: Positive for decreased concentration and dysphoric mood. Negative for confusion, sleep disturbance and suicidal ideas. The patient is nervous/anxious.     Objective:  BP 138/72 (BP Location: Left Arm, Patient Position: Sitting, Cuff Size: Normal)   Pulse 79   Temp 97.7 F (36.5 C) (Oral)   Ht 5\' 1"  (1.549 m)   Wt 152 lb (68.9 kg)   SpO2 98%   BMI 28.72 kg/m   BP Readings from Last 3 Encounters:  10/04/18 138/72  07/25/18 133/76  05/02/18 134/82    Wt Readings from Last 3 Encounters:  10/04/18 152 lb (68.9 kg)  07/25/18 156 lb (70.8 kg)  07/19/18 150 lb 6.4 oz (68.2 kg)    Physical Exam Constitutional:      General: She is not in acute distress.    Appearance: She is well-developed.  HENT:     Head: Normocephalic.     Right Ear: External ear normal.     Left Ear: External ear normal.     Nose: Nose normal.  Eyes:  General:        Right eye: No discharge.        Left eye: No discharge.     Conjunctiva/sclera: Conjunctivae normal.     Pupils: Pupils are equal, round, and reactive to light.  Neck:     Musculoskeletal: Normal range of motion and neck supple.     Thyroid: No thyromegaly.     Vascular: No JVD.     Trachea: No tracheal deviation.  Cardiovascular:     Rate and Rhythm: Normal rate and regular rhythm.     Heart sounds: Normal heart sounds.  Pulmonary:     Effort: No respiratory distress.     Breath sounds: No stridor. No wheezing.  Abdominal:     General: Bowel sounds are normal. There is no distension.       Palpations: Abdomen is soft. There is no mass.     Tenderness: There is no abdominal tenderness. There is no guarding or rebound.  Musculoskeletal:        General: No tenderness.  Lymphadenopathy:     Cervical: No cervical adenopathy.  Skin:    Findings: No erythema or rash.  Neurological:     Cranial Nerves: No cranial nerve deficit.     Motor: No abnormal muscle tone.     Coordination: Coordination normal.     Deep Tendon Reflexes: Reflexes normal.  Psychiatric:        Behavior: Behavior normal.        Thought Content: Thought content normal.        Judgment: Judgment normal.     Lab Results  Component Value Date   WBC 7.4 05/02/2018   HGB 13.4 05/02/2018   HCT 40.4 05/02/2018   PLT 200.0 05/02/2018   GLUCOSE 100 (H) 05/02/2018   CHOL 243 (H) 06/02/2017   TRIG 119.0 06/02/2017   HDL 42.80 06/02/2017   LDLDIRECT 162.8 06/28/2011   LDLCALC 177 (H) 06/02/2017   ALT 22 05/02/2018   AST 26 05/02/2018   NA 141 05/02/2018   K 3.6 05/02/2018   CL 103 05/02/2018   CREATININE 0.68 05/02/2018   BUN 9 05/02/2018   CO2 30 05/02/2018   TSH 1.69 05/02/2018   HGBA1C 5.6 01/02/2008    Vas US Carotid  Result Date: 09/14/2018 Carotid Arterial Duplex Study Indications:   In 07/2017 a carotid duplex showed a velocity of 154/42 cm/s in                the RICA and 27/06 cm/s in LICA. Patient c/o chronic intermittent                numbness on the left side of face and right arm but relates it to                neck and back issues. She denies any other cerebrovascular                symptoms. Risk Factors:  Hyperlipidemia, current smoker. Other Factors: COPD. Performing Technologist: Sharlett Iles RVT  Examination Guidelines: A complete evaluation includes B-mode imaging, spectral Doppler, color Doppler, and power Doppler as needed of all accessible portions of each vessel. Bilateral testing is considered an integral part of a complete examination. Limited examinations for reoccurring  indications may be performed as noted.  Right Carotid Findings: +----------+--------+--------+-------------+------------------+----------------+           PSV cm/sEDV cm/sStenosis     Describe  Comments         +----------+--------+--------+-------------+------------------+----------------+ CCA Prox  113     17                                                      +----------+--------+--------+-------------+------------------+----------------+ CCA Distal62      17                                     intimal                                                                   thickening       +----------+--------+--------+-------------+------------------+----------------+ ICA Prox  145     42      Stable 40-59%heterogenous and                                                          irregular                          +----------+--------+--------+-------------+------------------+----------------+ ICA Mid   77      22                                                      +----------+--------+--------+-------------+------------------+----------------+ ICA Distal49      17                                     tortuous         +----------+--------+--------+-------------+------------------+----------------+ ECA       97      15                   heterogenous and                                                          irregular                          +----------+--------+--------+-------------+------------------+----------------+ +----------+--------+-------+----------------+-------------------+           PSV cm/sEDV cmsDescribe        Arm Pressure (mmHG) +----------+--------+-------+----------------+-------------------+ [id number]            Multiphasic, [id number]                 +----------+--------+-------+----------------+-------------------+ +---------+--------+--+--------+--+---------+ VertebralPSV cm/s49EDV cm/s11Antegrade  +---------+--------+--+--------+--+---------+  Left Carotid Findings: +----------+--------+--------+-----------+-------------------+-----------------+  PSV cm/sEDV cm/sStenosis   Describe           Comments          +----------+--------+--------+-----------+-------------------+-----------------+ CCA Prox  110     26                                                      +----------+--------+--------+-----------+-------------------+-----------------+ CCA Distal87      25                                    intimal                                                                   thickening        +----------+--------+--------+-----------+-------------------+-----------------+ ICA Prox  107     24      Stable     heterogenous and                                               1-39%      irregular                            +----------+--------+--------+-----------+-------------------+-----------------+ ICA Mid   60      18                                    tortuous and                                                              turbulent         +----------+--------+--------+-----------+-------------------+-----------------+ ICA Distal46      15                                    tortuous          +----------+--------+--------+-----------+-------------------+-----------------+ ECA       73      16                 heterogenous and                                                          irregular                            +----------+--------+--------+-----------+-------------------+-----------------+ +----------+--------+--------+----------------+-------------------+  SubclavianPSV cm/sEDV cm/sDescribe        Arm Pressure (mmHG) +----------+--------+--------+----------------+-------------------+           127             Multiphasic, [id number]                 +----------+--------+--------+----------------+-------------------+  +---------+--------+--+--------+--+---------+ VertebralPSV cm/s60EDV cm/s16Antegrade +---------+--------+--+--------+--+---------+  Summary: Right Carotid: Velocities in the right ICA are consistent with a 40-59%                stenosis. The RICA velocities are elevated and stable compared to                the prior exam. Left Carotid: Velocities in the left ICA are consistent with a 1-39% stenosis.               The LICA velocities remain within normal range and stable compared               to the prior exam. Vertebrals:  Bilateral vertebral arteries demonstrate antegrade flow. Subclavians: Normal flow hemodynamics were seen in bilateral subclavian              arteries. *See table(s) above for measurements and observations.  Electronically signed by Quay Burow MD on 09/14/2018 at 10:14:44 AM.    Final     Assessment & Plan:   There are no diagnoses linked to this encounter.   No orders of the defined types were placed in this encounter.    Follow-up: No follow-ups on file.  Walker Kehr, MD

## 2018-10-04 NOTE — Assessment & Plan Note (Signed)
Declined statins. 

## 2018-10-04 NOTE — Assessment & Plan Note (Signed)
Trintellix - low dose

## 2018-10-05 ENCOUNTER — Telehealth: Payer: Self-pay | Admitting: Internal Medicine

## 2018-10-05 NOTE — Telephone Encounter (Signed)
Informed patient labs have already been entered so she can come to the lab to have them drawn.

## 2018-10-05 NOTE — Telephone Encounter (Signed)
Copied from [place] [phone]. Topic: Quick Communication - See Telephone Encounter >> Oct 05, 2018 11:32 AM Blase Mess A wrote: CRM for notification. See Telephone encounter for: 10/05/18.  Patient is calling because she did not get labs done she would let to get done next week.

## 2018-10-22 ENCOUNTER — Ambulatory Visit: Payer: Medicare Other | Admitting: Internal Medicine

## 2018-10-29 ENCOUNTER — Encounter: Payer: Self-pay | Admitting: Internal Medicine

## 2018-10-29 ENCOUNTER — Ambulatory Visit (INDEPENDENT_AMBULATORY_CARE_PROVIDER_SITE_OTHER): Payer: Medicare Other | Admitting: Internal Medicine

## 2018-10-29 ENCOUNTER — Other Ambulatory Visit (INDEPENDENT_AMBULATORY_CARE_PROVIDER_SITE_OTHER): Payer: Medicare Other

## 2018-10-29 VITALS — BP 132/78 | HR 85 | Temp 98.5°F | Ht 61.0 in | Wt 151.0 lb

## 2018-10-29 DIAGNOSIS — R0602 Shortness of breath: Secondary | ICD-10-CM | POA: Diagnosis not present

## 2018-10-29 DIAGNOSIS — E785 Hyperlipidemia, unspecified: Secondary | ICD-10-CM | POA: Diagnosis not present

## 2018-10-29 DIAGNOSIS — R079 Chest pain, unspecified: Secondary | ICD-10-CM

## 2018-10-29 DIAGNOSIS — R0789 Other chest pain: Secondary | ICD-10-CM | POA: Diagnosis not present

## 2018-10-29 DIAGNOSIS — J449 Chronic obstructive pulmonary disease, unspecified: Secondary | ICD-10-CM

## 2018-10-29 DIAGNOSIS — R1084 Generalized abdominal pain: Secondary | ICD-10-CM | POA: Diagnosis not present

## 2018-10-29 LAB — LIPID PANEL
Cholesterol: 234 mg/dL — ABNORMAL HIGH (ref 0–200)
HDL: 37 mg/dL — ABNORMAL LOW (ref >39.00)
LDL Cholesterol: 163 mg/dL — ABNORMAL HIGH (ref 0–99)
NonHDL: 196.54
Total CHOL/HDL Ratio: 6
Triglycerides: 167 mg/dL — ABNORMAL HIGH (ref 0.0–149.0)
VLDL: 33.4 mg/dL (ref 0.0–40.0)

## 2018-10-29 LAB — BASIC METABOLIC PANEL
BUN: 8 mg/dL (ref 6–23)
CO2: 31 mEq/L (ref 19–32)
Calcium: 10 mg/dL (ref 8.4–10.5)
Chloride: 104 mEq/L (ref 96–112)
Creatinine, Ser: 0.73 mg/dL (ref 0.40–1.20)
GFR: 78.1 mL/min (ref >60.00)
GLUCOSE: 97 mg/dL (ref 70–99)
Potassium: 4.5 mEq/L (ref 3.5–5.1)
SODIUM: 143 meq/L (ref 135–145)

## 2018-10-29 LAB — HEPATIC FUNCTION PANEL
ALT: 18 U/L (ref 0–35)
AST: 26 U/L (ref 0–37)
Albumin: 4.5 g/dL (ref 3.5–5.2)
Alkaline Phosphatase: 71 U/L (ref 39–117)
BILIRUBIN TOTAL: 0.6 mg/dL (ref 0.2–1.2)
Bilirubin, Direct: 0.1 mg/dL (ref 0.0–0.3)
Total Protein: 7.5 g/dL (ref 6.0–8.3)

## 2018-10-29 LAB — CBC WITH DIFFERENTIAL/PLATELET
Basophils Absolute: 0.1 10*3/uL (ref 0.0–0.1)
Basophils Relative: 1.2 % (ref 0.0–3.0)
Eosinophils Absolute: 0.4 10*3/uL (ref 0.0–0.7)
Eosinophils Relative: 5 % (ref 0.0–5.0)
HCT: 40 % (ref 36.0–46.0)
Hemoglobin: 13 g/dL (ref 12.0–15.0)
LYMPHS ABS: 3.2 10*3/uL (ref 0.7–4.0)
Lymphocytes Relative: 44.8 % (ref 12.0–46.0)
MCHC: 32.5 g/dL (ref 30.0–36.0)
MCV: 75.8 fl — ABNORMAL LOW (ref 78.0–100.0)
Monocytes Absolute: 0.5 10*3/uL (ref 0.1–1.0)
Monocytes Relative: 7.3 % (ref 3.0–12.0)
NEUTROS PCT: 41.7 % — AB (ref 43.0–77.0)
Neutro Abs: 3 10*3/uL (ref 1.4–7.7)
Platelets: 217 10*3/uL (ref 150.0–400.0)
RBC: 5.27 Mil/uL — AB (ref 3.87–5.11)
RDW: 16.2 % — ABNORMAL HIGH (ref 11.5–15.5)
WBC: 7.2 10*3/uL (ref 4.0–10.5)

## 2018-10-29 LAB — TSH: TSH: 1.26 u[IU]/mL (ref 0.35–4.50)

## 2018-10-29 MED ORDER — TIOTROPIUM BROMIDE MONOHYDRATE 2.5 MCG/ACT IN AERS: 2.0000 | INHALATION_SPRAY | Freq: Every day | RESPIRATORY_TRACT | 11 refills | Status: DC

## 2018-10-29 NOTE — Assessment & Plan Note (Addendum)
Symbicort d/c Proventil - d/c Xopenex prn

## 2018-10-29 NOTE — Progress Notes (Signed)
Subjective:  Patient ID: Theresa Aguirre, female    DOB: 1945/12/11  Age: 73 y.o. MRN: [id number]  CC: No chief complaint on file.   HPI Theresa Aguirre presents for fatigue, weakness and DOE C/o anxiety; occ CP Smoking very little  Outpatient Medications Prior to Visit  Medication Sig Dispense Refill  . ALPRAZolam (XANAX) 1 MG tablet TAKE 1 TABLET BY MOUTH FOUR TIMES DAILY AS NEEDED FOR SLEEP OR ANXIETY 360 tablet 1  . aspirin (BAYER ASPIRIN) 325 MG tablet Take 0.5 tablets (162 mg total) by mouth daily. 100 tablet 3  . b complex vitamins tablet Take 1 tablet by mouth daily. 100 tablet 3  . levalbuterol (XOPENEX HFA) 45 MCG/ACT inhaler Inhale 2 puffs into the lungs every 6 (six) hours as needed for wheezing. 1 Inhaler 11  . lipase/protease/amylase (CREON) 36000 UNITS CPEP capsule Take 1 capsule (36,000 Units total) by mouth 3 (three) times daily before meals. 90 capsule 11  . pantoprazole (PROTONIX) 40 MG tablet Take 1 tablet (40 mg total) by mouth daily. 90 tablet 3  . roflumilast (DALIRESP) 500 MCG TABS tablet Take 1 tablet (500 mcg total) by mouth daily. 90 tablet 5  . Simethicone (GAS-X PO) Take by mouth.    . temazepam (RESTORIL) 15 MG capsule TAKE 1 TO 2 CAPSULES BY MOUTH EVERY NIGHT AT BEDTIME AS NEEDED FOR INSOMNIA 180 capsule 1  . vortioxetine HBr (TRINTELLIX) 10 MG TABS tablet Take 1 tablet (10 mg total) by mouth daily. 30 tablet 5  . ondansetron (ZOFRAN) 4 MG tablet Take 1 tablet (4 mg total) by mouth every 2 (two) hours as needed for nausea or vomiting. 4 tablet 0   No facility-administered medications prior to visit.     ROS: Review of Systems  Constitutional: Positive for fatigue. Negative for activity change, appetite change, chills and unexpected weight change.  HENT: Negative for congestion, mouth sores and sinus pressure.   Eyes: Negative for visual disturbance.  Respiratory: Positive for chest tightness, shortness of breath and wheezing. Negative for cough.     Gastrointestinal: Negative for abdominal pain and nausea.  Genitourinary: Negative for difficulty urinating, frequency and vaginal pain.  Musculoskeletal: Negative for back pain and gait problem.  Skin: Negative for pallor and rash.  Neurological: Positive for weakness. Negative for dizziness, tremors, numbness and headaches.  Psychiatric/Behavioral: Positive for dysphoric mood. Negative for confusion and sleep disturbance. The patient is nervous/anxious.     Objective:  BP 132/78 (BP Location: Left Arm, Patient Position: Sitting, Cuff Size: Normal)   Pulse 85   Temp 98.5 F (36.9 C) (Oral)   Ht 5\' 1"  (1.549 m)   Wt 151 lb (68.5 kg)   SpO2 98%   BMI 28.53 kg/m   BP Readings from Last 3 Encounters:  10/29/18 132/78  10/04/18 138/72  07/25/18 133/76    Wt Readings from Last 3 Encounters:  10/29/18 151 lb (68.5 kg)  10/04/18 152 lb (68.9 kg)  07/25/18 156 lb (70.8 kg)    Physical Exam Constitutional:      General: She is not in acute distress.    Appearance: She is well-developed.  HENT:     Head: Normocephalic.     Right Ear: External ear normal.     Left Ear: External ear normal.     Nose: Nose normal.  Eyes:     General:        Right eye: No discharge.        Left eye: No discharge.  Conjunctiva/sclera: Conjunctivae normal.     Pupils: Pupils are equal, round, and reactive to light.  Neck:     Musculoskeletal: Normal range of motion and neck supple.     Thyroid: No thyromegaly.     Vascular: No JVD.     Trachea: No tracheal deviation.  Cardiovascular:     Rate and Rhythm: Normal rate and regular rhythm.     Heart sounds: Normal heart sounds.  Pulmonary:     Effort: No respiratory distress.     Breath sounds: No stridor. No wheezing.  Abdominal:     General: Bowel sounds are normal. There is no distension.     Palpations: Abdomen is soft. There is no mass.     Tenderness: There is no abdominal tenderness. There is no guarding or rebound.   Musculoskeletal:        General: No tenderness.  Lymphadenopathy:     Cervical: No cervical adenopathy.  Skin:    Findings: No erythema or rash.  Neurological:     Cranial Nerves: No cranial nerve deficit.     Motor: No abnormal muscle tone.     Coordination: Coordination normal.     Deep Tendon Reflexes: Reflexes normal.  Psychiatric:        Behavior: Behavior normal.        Thought Content: Thought content normal.        Judgment: Judgment normal.     Lab Results  Component Value Date   WBC 7.4 05/02/2018   HGB 13.4 05/02/2018   HCT 40.4 05/02/2018   PLT 200.0 05/02/2018   GLUCOSE 100 (H) 05/02/2018   CHOL 243 (H) 06/02/2017   TRIG 119.0 06/02/2017   HDL 42.80 06/02/2017   LDLDIRECT 162.8 06/28/2011   LDLCALC 177 (H) 06/02/2017   ALT 22 05/02/2018   AST 26 05/02/2018   NA 141 05/02/2018   K 3.6 05/02/2018   CL 103 05/02/2018   CREATININE 0.68 05/02/2018   BUN 9 05/02/2018   CO2 30 05/02/2018   TSH 1.69 05/02/2018   HGBA1C 5.6 01/02/2008    Vas US Carotid  Result Date: 09/14/2018 Carotid Arterial Duplex Study Indications:   In 07/2017 a carotid duplex showed a velocity of 154/42 cm/s in                the RICA and 55/73 cm/s in LICA. Patient c/o chronic intermittent                numbness on the left side of face and right arm but relates it to                neck and back issues. She denies any other cerebrovascular                symptoms. Risk Factors:  Hyperlipidemia, current smoker. Other Factors: COPD. Performing Technologist: Sharlett Iles RVT  Examination Guidelines: A complete evaluation includes B-mode imaging, spectral Doppler, color Doppler, and power Doppler as needed of all accessible portions of each vessel. Bilateral testing is considered an integral part of a complete examination. Limited examinations for reoccurring indications may be performed as noted.  Right Carotid Findings:  +----------+--------+--------+-------------+------------------+----------------+           PSV cm/sEDV cm/sStenosis     Describe          Comments         +----------+--------+--------+-------------+------------------+----------------+ CCA Prox  113     17                                                      +----------+--------+--------+-------------+------------------+----------------+  CCA Distal62      17                                     intimal                                                                   thickening       +----------+--------+--------+-------------+------------------+----------------+ ICA Prox  145     42      Stable 40-59%heterogenous and                                                          irregular                          +----------+--------+--------+-------------+------------------+----------------+ ICA Mid   77      22                                                      +----------+--------+--------+-------------+------------------+----------------+ ICA Distal49      17                                     tortuous         +----------+--------+--------+-------------+------------------+----------------+ ECA       97      15                   heterogenous and                                                          irregular                          +----------+--------+--------+-------------+------------------+----------------+ +----------+--------+-------+----------------+-------------------+           PSV cm/sEDV cmsDescribe        Arm Pressure (mmHG) +----------+--------+-------+----------------+-------------------+ [id number]            Multiphasic, [id number]                 +----------+--------+-------+----------------+-------------------+ +---------+--------+--+--------+--+---------+ VertebralPSV cm/s49EDV cm/s11Antegrade +---------+--------+--+--------+--+---------+  Left Carotid  Findings: +----------+--------+--------+-----------+-------------------+-----------------+           PSV cm/sEDV cm/sStenosis   Describe           Comments          +----------+--------+--------+-----------+-------------------+-----------------+ CCA Prox  110     26                                                      +----------+--------+--------+-----------+-------------------+-----------------+  CCA Distal87      25                                    intimal                                                                   thickening        +----------+--------+--------+-----------+-------------------+-----------------+ ICA Prox  107     24      Stable     heterogenous and                                               1-39%      irregular                            +----------+--------+--------+-----------+-------------------+-----------------+ ICA Mid   60      18                                    tortuous and                                                              turbulent         +----------+--------+--------+-----------+-------------------+-----------------+ ICA Distal46      15                                    tortuous          +----------+--------+--------+-----------+-------------------+-----------------+ ECA       73      16                 heterogenous and                                                          irregular                            +----------+--------+--------+-----------+-------------------+-----------------+ +----------+--------+--------+----------------+-------------------+ SubclavianPSV cm/sEDV cm/sDescribe        Arm Pressure (mmHG) +----------+--------+--------+----------------+-------------------+           127             Multiphasic, [id number]                 +----------+--------+--------+----------------+-------------------+ +---------+--------+--+--------+--+---------+ VertebralPSV  cm/s60EDV cm/s16Antegrade +---------+--------+--+--------+--+---------+  Summary: Right Carotid: Velocities in the right ICA are consistent with a 40-59%  stenosis. The RICA velocities are elevated and stable compared to                the prior exam. Left Carotid: Velocities in the left ICA are consistent with a 1-39% stenosis.               The LICA velocities remain within normal range and stable compared               to the prior exam. Vertebrals:  Bilateral vertebral arteries demonstrate antegrade flow. Subclavians: Normal flow hemodynamics were seen in bilateral subclavian              arteries. *See table(s) above for measurements and observations.  Electronically signed by Quay Burow MD on 09/14/2018 at 10:14:44 AM.    Final     Assessment & Plan:   There are no diagnoses linked to this encounter.   No orders of the defined types were placed in this encounter.    Follow-up: No follow-ups on file.  Walker Kehr, MD

## 2018-10-29 NOTE — Patient Instructions (Signed)
Use Xopenex before each exertion Use Spiriva Respimat 2 inhalations daily

## 2018-10-31 ENCOUNTER — Other Ambulatory Visit: Payer: Self-pay | Admitting: Internal Medicine

## 2018-10-31 NOTE — Progress Notes (Unsigned)
[id number] at [facility]

## 2018-11-06 ENCOUNTER — Encounter: Payer: Self-pay | Admitting: Internal Medicine

## 2018-11-06 NOTE — Assessment & Plan Note (Signed)
Pt refused Cardiology ref Coronary CT angio at [facility] To ER if worse Treat GERD/COPD

## 2018-11-06 NOTE — Assessment & Plan Note (Signed)
Pt refused Cardiology ref Coronary CT angio at [facility] To ER if worse Treat COPD Stop smoking!

## 2018-11-21 ENCOUNTER — Telehealth (HOSPITAL_COMMUNITY): Payer: Self-pay | Admitting: Emergency Medicine

## 2018-11-21 NOTE — Telephone Encounter (Signed)
Reaching out to patient to offer assistance regarding upcoming cardiac imaging study; pt verbalizes understanding of appt date/time, parking situation and where to check in, pre-test NPO status and medications ordered, and verified current allergies; name and call back number provided for further questions should they arise Cayne Yom RN Navigator Cardiac Imaging [facility] [phone] office [phone] cell 

## 2018-11-23 ENCOUNTER — Ambulatory Visit (HOSPITAL_COMMUNITY)
Admission: RE | Admit: 2018-11-23 | Discharge: 2018-11-23 | Disposition: A | Discharge status: Home / Self Care | Payer: Medicare HMO | Source: Ambulatory Visit | Attending: Internal Medicine | Admitting: Internal Medicine

## 2018-11-23 ENCOUNTER — Encounter (HOSPITAL_COMMUNITY): Payer: Self-pay

## 2018-11-23 ENCOUNTER — Ambulatory Visit (HOSPITAL_COMMUNITY): Admission: RE | Admit: 2018-11-23 | Payer: Medicare HMO | Source: Ambulatory Visit

## 2018-11-23 DIAGNOSIS — R079 Chest pain, unspecified: Secondary | ICD-10-CM | POA: Insufficient documentation

## 2018-11-23 DIAGNOSIS — I700 Atherosclerosis of aorta: Secondary | ICD-10-CM | POA: Diagnosis not present

## 2018-11-23 MED ORDER — IOPAMIDOL (ISOVUE-370) INJECTION 76%
80.0000 mL | Freq: Once | INTRAVENOUS | Status: AC | PRN
  Administered 2018-11-23: 80 mL via INTRAVENOUS

## 2018-11-23 MED ORDER — NITROGLYCERIN 0.4 MG SL SUBL
SUBLINGUAL_TABLET | SUBLINGUAL | Status: AC
  Administered 2018-11-23: 0.8 mg via SUBLINGUAL
  Filled 2018-11-23: qty 2

## 2018-11-23 MED ORDER — METOPROLOL TARTRATE 5 MG/5ML IV SOLN
INTRAVENOUS | Status: AC
  Administered 2018-11-23: 5 mg via INTRAVENOUS
  Filled 2018-11-23: qty 10

## 2018-11-23 MED ORDER — METOPROLOL TARTRATE 5 MG/5ML IV SOLN
5.0000 mg | INTRAVENOUS | Status: DC | PRN
  Administered 2018-11-23: 5 mg via INTRAVENOUS
  Filled 2018-11-23: qty 5

## 2018-11-23 MED ORDER — NITROGLYCERIN 0.4 MG SL SUBL
0.8000 mg | SUBLINGUAL_TABLET | Freq: Once | SUBLINGUAL | Status: AC
  Administered 2018-11-23: 0.8 mg via SUBLINGUAL
  Filled 2018-11-23: qty 25

## 2018-11-23 NOTE — Progress Notes (Signed)
Patient tolerated CT without incident. Did not want anything to eat or drink. Ambulatory steady gait to exit.  

## 2018-11-29 ENCOUNTER — Other Ambulatory Visit: Payer: Self-pay | Admitting: Internal Medicine

## 2018-11-29 MED ORDER — PRAVASTATIN SODIUM 20 MG PO TABS: 20.0000 mg | ORAL_TABLET | Freq: Every day | ORAL | 11 refills | Status: DC

## 2019-01-10 ENCOUNTER — Other Ambulatory Visit: Payer: Self-pay | Admitting: Internal Medicine

## 2019-02-14 ENCOUNTER — Other Ambulatory Visit: Payer: Self-pay | Admitting: Internal Medicine

## 2019-03-11 ENCOUNTER — Other Ambulatory Visit: Payer: Self-pay | Admitting: Internal Medicine

## 2019-03-11 NOTE — Telephone Encounter (Signed)
Pt needs a refill of her Duanne Moron please advise

## 2019-03-12 MED ORDER — ALPRAZOLAM 1 MG PO TABS: ORAL_TABLET | ORAL | 0 refills | Status: DC

## 2019-03-12 NOTE — Addendum Note (Signed)
Addended by: Karren Cobble on: 03/12/2019 10:21 AM   Modules accepted: Orders

## 2019-04-03 ENCOUNTER — Other Ambulatory Visit: Payer: Self-pay

## 2019-04-03 ENCOUNTER — Encounter: Payer: Self-pay | Admitting: Internal Medicine

## 2019-04-03 ENCOUNTER — Other Ambulatory Visit (INDEPENDENT_AMBULATORY_CARE_PROVIDER_SITE_OTHER): Payer: Medicare HMO

## 2019-04-03 ENCOUNTER — Ambulatory Visit (INDEPENDENT_AMBULATORY_CARE_PROVIDER_SITE_OTHER): Payer: Medicare HMO | Admitting: Internal Medicine

## 2019-04-03 DIAGNOSIS — J449 Chronic obstructive pulmonary disease, unspecified: Secondary | ICD-10-CM | POA: Diagnosis not present

## 2019-04-03 DIAGNOSIS — F331 Major depressive disorder, recurrent, moderate: Secondary | ICD-10-CM

## 2019-04-03 DIAGNOSIS — E785 Hyperlipidemia, unspecified: Secondary | ICD-10-CM | POA: Diagnosis not present

## 2019-04-03 DIAGNOSIS — R600 Localized edema: Secondary | ICD-10-CM

## 2019-04-03 LAB — BASIC METABOLIC PANEL
BUN: 12 mg/dL (ref 6–23)
CO2: 28 mEq/L (ref 19–32)
Calcium: 9.1 mg/dL (ref 8.4–10.5)
Chloride: 103 mEq/L (ref 96–112)
Creatinine, Ser: 0.63 mg/dL (ref 0.40–1.20)
GFR: 92.47 mL/min (ref >60.00)
Glucose, Bld: 109 mg/dL — ABNORMAL HIGH (ref 70–99)
Potassium: 3.9 mEq/L (ref 3.5–5.1)
Sodium: 140 mEq/L (ref 135–145)

## 2019-04-03 LAB — CBC WITH DIFFERENTIAL/PLATELET
Basophils Absolute: 0.1 10*3/uL (ref 0.0–0.1)
Basophils Relative: 1.5 % (ref 0.0–3.0)
Eosinophils Absolute: 0.4 10*3/uL (ref 0.0–0.7)
Eosinophils Relative: 5.8 % — ABNORMAL HIGH (ref 0.0–5.0)
HCT: 35.4 % — ABNORMAL LOW (ref 36.0–46.0)
Hemoglobin: 11.4 g/dL — ABNORMAL LOW (ref 12.0–15.0)
Lymphocytes Relative: 31.6 % (ref 12.0–46.0)
Lymphs Abs: 2.4 10*3/uL (ref 0.7–4.0)
MCHC: 32.3 g/dL (ref 30.0–36.0)
MCV: 73.7 fl — ABNORMAL LOW (ref 78.0–100.0)
Monocytes Absolute: 0.7 10*3/uL (ref 0.1–1.0)
Monocytes Relative: 9 % (ref 3.0–12.0)
Neutro Abs: 3.9 10*3/uL (ref 1.4–7.7)
Neutrophils Relative %: 52.1 % (ref 43.0–77.0)
Platelets: 208 10*3/uL (ref 150.0–400.0)
RBC: 4.8 Mil/uL (ref 3.87–5.11)
RDW: 15.9 % — ABNORMAL HIGH (ref 11.5–15.5)
WBC: 7.5 10*3/uL (ref 4.0–10.5)

## 2019-04-03 LAB — HEPATIC FUNCTION PANEL
ALT: 16 U/L (ref 0–35)
AST: 23 U/L (ref 0–37)
Albumin: 4.3 g/dL (ref 3.5–5.2)
Alkaline Phosphatase: 57 U/L (ref 39–117)
Bilirubin, Direct: 0.1 mg/dL (ref 0.0–0.3)
Total Bilirubin: 0.5 mg/dL (ref 0.2–1.2)
Total Protein: 7.1 g/dL (ref 6.0–8.3)

## 2019-04-03 LAB — TSH: TSH: 1.32 u[IU]/mL (ref 0.35–4.50)

## 2019-04-03 MED ORDER — POTASSIUM CHLORIDE ER 10 MEQ PO TBCR: EXTENDED_RELEASE_TABLET | ORAL | 1 refills | Status: DC

## 2019-04-03 MED ORDER — FUROSEMIDE 20 MG PO TABS: 20.0000 mg | ORAL_TABLET | Freq: Two times a day (BID) | ORAL | 1 refills | Status: DC | PRN

## 2019-04-03 NOTE — Assessment & Plan Note (Signed)
HCTZ Lasix and Kdur prn

## 2019-04-03 NOTE — Assessment & Plan Note (Signed)
Trintellix 

## 2019-04-03 NOTE — Assessment & Plan Note (Addendum)
Pravachol - pt stopped

## 2019-04-03 NOTE — Progress Notes (Signed)
Subjective:  Patient ID: Theresa Aguirre, female    DOB: Jun 15, 1946  Age: 73 y.o. MRN: [id number]  CC: No chief complaint on file.   HPI Theresa Aguirre presents for wt gain, new LE edema after eating pickles F/u anxiety, COPD  Outpatient Medications Prior to Visit  Medication Sig Dispense Refill  . ALPRAZolam (XANAX) 1 MG tablet TAKE 1 TABLET BY MOUTH FOUR TIMES DAILY AS NEEDED FOR SLEEP OR ANXIETY 360 tablet 0  . aspirin (BAYER ASPIRIN) 325 MG tablet Take 0.5 tablets (162 mg total) by mouth daily. 100 tablet 3  . b complex vitamins tablet Take 1 tablet by mouth daily. 100 tablet 3  . hydrochlorothiazide (MICROZIDE) 12.5 MG capsule TAKE 1 CAPSULE(12.5 MG) BY MOUTH DAILY 90 capsule 3  . levalbuterol (XOPENEX HFA) 45 MCG/ACT inhaler Inhale 2 puffs into the lungs every 6 (six) hours as needed for wheezing. 1 Inhaler 11  . lipase/protease/amylase (CREON) 36000 UNITS CPEP capsule Take 1 capsule (36,000 Units total) by mouth 3 (three) times daily before meals. 90 capsule 11  . pantoprazole (PROTONIX) 40 MG tablet Take 1 tablet (40 mg total) by mouth daily. 90 tablet 3  . roflumilast (DALIRESP) 500 MCG TABS tablet Take 1 tablet (500 mcg total) by mouth daily. 90 tablet 5  . Simethicone (GAS-X PO) Take by mouth.    . temazepam (RESTORIL) 15 MG capsule TAKE 1 TO 2 CAPSULES BY MOUTH EVERY NIGHT AT BEDTIME AS NEEDED FOR INSOMNIA 180 capsule 1  . Tiotropium Bromide Monohydrate (SPIRIVA RESPIMAT) 2.5 MCG/ACT AERS Inhale 2 puffs into the lungs daily. 1 Inhaler 11  . vortioxetine HBr (TRINTELLIX) 10 MG TABS tablet Take 1 tablet (10 mg total) by mouth daily. 30 tablet 5  . pravastatin (PRAVACHOL) 20 MG tablet Take 1 tablet (20 mg total) by mouth daily. (Patient not taking: Reported on 04/03/2019) 30 tablet 11   No facility-administered medications prior to visit.     ROS: Review of Systems  Constitutional: Positive for fatigue. Negative for activity change, appetite change, chills and unexpected  weight change.  HENT: Negative for congestion, mouth sores and sinus pressure.   Eyes: Negative for visual disturbance.  Respiratory: Negative for cough and chest tightness.   Gastrointestinal: Negative for abdominal pain and nausea.  Genitourinary: Negative for difficulty urinating, frequency and vaginal pain.  Musculoskeletal: Positive for arthralgias and back pain. Negative for gait problem.  Skin: Negative for pallor and rash.  Neurological: Negative for dizziness, tremors, weakness, numbness and headaches.  Psychiatric/Behavioral: Positive for sleep disturbance. Negative for confusion and suicidal ideas. The patient is nervous/anxious.     Objective:  BP (!) 154/80 (BP Location: Left Arm, Patient Position: Sitting, Cuff Size: Normal)   Pulse 87   Temp 97.9 F (36.6 C) (Oral)   Ht 5\' 1"  (1.549 m)   Wt 157 lb (71.2 kg)   SpO2 98%   BMI 29.66 kg/m   BP Readings from Last 3 Encounters:  04/03/19 (!) 154/80  11/23/18 122/78  10/29/18 132/78    Wt Readings from Last 3 Encounters:  04/03/19 157 lb (71.2 kg)  10/29/18 151 lb (68.5 kg)  10/04/18 152 lb (68.9 kg)    Physical Exam Constitutional:      General: She is not in acute distress.    Appearance: She is well-developed.  HENT:     Head: Normocephalic.     Right Ear: External ear normal.     Left Ear: External ear normal.     Nose: Nose normal.  Eyes:     General:        Right eye: No discharge.        Left eye: No discharge.     Conjunctiva/sclera: Conjunctivae normal.     Pupils: Pupils are equal, round, and reactive to light.  Neck:     Musculoskeletal: Normal range of motion and neck supple.     Thyroid: No thyromegaly.     Vascular: No JVD.     Trachea: No tracheal deviation.  Cardiovascular:     Rate and Rhythm: Normal rate and regular rhythm.     Heart sounds: Normal heart sounds.  Pulmonary:     Effort: No respiratory distress.     Breath sounds: No stridor. No wheezing.  Abdominal:     General:  Bowel sounds are normal. There is no distension.     Palpations: Abdomen is soft. There is no mass.     Tenderness: There is no abdominal tenderness. There is no guarding or rebound.  Musculoskeletal:        General: Tenderness present.     Right lower leg: Edema present.     Left lower leg: Edema present.  Lymphadenopathy:     Cervical: No cervical adenopathy.  Skin:    Findings: No erythema or rash.  Neurological:     Cranial Nerves: No cranial nerve deficit.     Motor: No abnormal muscle tone.     Coordination: Coordination normal.     Deep Tendon Reflexes: Reflexes normal.  Psychiatric:        Behavior: Behavior normal.        Thought Content: Thought content normal.        Judgment: Judgment normal.   trace B edema  Lab Results  Component Value Date   WBC 7.2 10/29/2018   HGB 13.0 10/29/2018   HCT 40.0 10/29/2018   PLT 217.0 10/29/2018   GLUCOSE 97 10/29/2018   CHOL 234 (H) 10/29/2018   TRIG 167.0 (H) 10/29/2018   HDL 37.00 (L) 10/29/2018   LDLDIRECT 162.8 06/28/2011   LDLCALC 163 (H) 10/29/2018   ALT 18 10/29/2018   AST 26 10/29/2018   NA 143 10/29/2018   K 4.5 10/29/2018   CL 104 10/29/2018   CREATININE 0.73 10/29/2018   BUN 8 10/29/2018   CO2 31 10/29/2018   TSH 1.26 10/29/2018   HGBA1C 5.6 01/02/2008    Ct Coronary Morph W/cta Cor W/score W/ca W/cm &/or Wo/cm  Addendum Date: 11/23/2018   ADDENDUM REPORT: 11/23/2018 16:43 CLINICAL DATA:  61F wtih fatigue, carotid stenosis, COPD and coronary calcifications on chest CT. EXAM: Cardiac/Coronary  CT TECHNIQUE: The patient was scanned on a [employer]. FINDINGS: A 120 kV prospective scan was triggered in the descending thoracic aorta at 111 HU's. Axial non-contrast 3 mm slices were carried out through the heart. The data set was analyzed on a dedicated work station and scored using the [place]. Gantry rotation speed was 250 msecs and collimation was .6 mm. No beta blockade and 0.8 mg of sl NTG was  given. The 3D data set was reconstructed in 5% intervals of the 67-82 % of the R-R cycle. Diastolic phases were analyzed on a dedicated work station using MPR, MIP and VRT modes. The patient received 80 cc of contrast. Aorta: Normal size. Ascending aorta 3.1 cm. Mild calcification of the aortic root and descending aorta. No dissection. Aortic Valve:  Trileaflet.  No calcifications. Coronary Arteries:  Normal coronary origin.  Right dominance. RCA  is a large dominant artery that gives rise to PDA and PLVB. There is mild (25%) soft attenuation plaque proximally. Left main is a large artery that gives rise to LAD and LCX arteries. LAD is a large vessel that has mild (25%) calcified plaque proximally and scattered <25% calcified plaque in the mid vessel. D1 has no significant plaque. LCX is a non-dominant artery. There is <25% calcified plaque in the proximal and mid LCX. There is a small, OM1, large branching OM2 and a small OM3 without plaque. Other findings: Normal pulmonary vein drainage into the left atrium. Normal let atrial appendage without a thrombus. Normal size of the pulmonary artery. IMPRESSION: 1. Coronary calcium score of 99. This was 65th percentile for age and sex matched control. 2. Normal coronary origin with right dominance. 3. No evidence of obstructive CAD. Skeet Latch, MD Electronically Signed   By: Skeet Latch   On: 11/23/2018 16:43   Result Date: 11/23/2018 EXAM: OVER-READ INTERPRETATION  CT CHEST The following report is an over-read performed by radiologist Dr. Vinnie Langton of [facility], [place] on 11/23/2018. This over-read does not include interpretation of cardiac or coronary anatomy or pathology. The coronary calcium score/coronary CTA interpretation by the cardiologist is attached. COMPARISON:  None. FINDINGS: Aortic atherosclerosis. Within the visualized portions of the thorax there are no suspicious appearing pulmonary nodules or masses, there is no acute  consolidative airspace disease, no pleural effusions, no pneumothorax and no lymphadenopathy. Visualized portions of the upper abdomen demonstrates diffuse low attenuation throughout the visualized hepatic parenchyma, indicative of hepatic steatosis. Moderate-sized hiatal hernia. There are no aggressive appearing lytic or blastic lesions noted in the visualized portions of the skeleton. IMPRESSION: 1. Hepatic steatosis. 2.  Aortic Atherosclerosis (ICD10-I70.0). Electronically Signed: By: Vinnie Langton M.D. On: 11/23/2018 16:04    Assessment & Plan:   There are no diagnoses linked to this encounter.   No orders of the defined types were placed in this encounter.    Follow-up: No follow-ups on file.  Walker Kehr, MD

## 2019-04-03 NOTE — Assessment & Plan Note (Signed)
Daliresp

## 2019-04-04 ENCOUNTER — Other Ambulatory Visit (HOSPITAL_COMMUNITY): Admission: RE | Admit: 2019-04-04 | Payer: Medicare HMO | Source: Ambulatory Visit

## 2019-05-27 ENCOUNTER — Other Ambulatory Visit: Payer: Self-pay

## 2019-05-27 ENCOUNTER — Encounter: Payer: Self-pay | Admitting: Internal Medicine

## 2019-05-27 ENCOUNTER — Ambulatory Visit (INDEPENDENT_AMBULATORY_CARE_PROVIDER_SITE_OTHER): Payer: Medicare HMO | Admitting: Internal Medicine

## 2019-05-27 DIAGNOSIS — F4323 Adjustment disorder with mixed anxiety and depressed mood: Secondary | ICD-10-CM | POA: Diagnosis not present

## 2019-05-27 DIAGNOSIS — R609 Edema, unspecified: Secondary | ICD-10-CM | POA: Diagnosis not present

## 2019-05-27 DIAGNOSIS — E785 Hyperlipidemia, unspecified: Secondary | ICD-10-CM

## 2019-05-27 DIAGNOSIS — J449 Chronic obstructive pulmonary disease, unspecified: Secondary | ICD-10-CM | POA: Diagnosis not present

## 2019-05-27 MED ORDER — CLOBETASOL PROPIONATE 0.05 % EX CREA: 1.0000 "application " | TOPICAL_CREAM | Freq: Two times a day (BID) | CUTANEOUS | 3 refills | Status: DC

## 2019-05-27 MED ORDER — CEFUROXIME AXETIL 250 MG PO TABS: 250.0000 mg | ORAL_TABLET | Freq: Two times a day (BID) | ORAL | 0 refills | Status: DC

## 2019-05-27 NOTE — Assessment & Plan Note (Signed)
Xanax prn  Potential benefits of a long term benzodiazepines  use as well as potential risks  and complications were explained to the patient and were aknowledged. 

## 2019-05-27 NOTE — Assessment & Plan Note (Signed)
Pravachol

## 2019-05-27 NOTE — Progress Notes (Signed)
Subjective:  Patient ID: Theresa Aguirre, female    DOB: Sep 19, 1946  Age: 73 y.o. MRN: [id number]  CC: No chief complaint on file.   HPI Theresa Aguirre presents for carotid stenosis, GERD, anxiety f/u  Outpatient Medications Prior to Visit  Medication Sig Dispense Refill  . ALPRAZolam (XANAX) 1 MG tablet TAKE 1 TABLET BY MOUTH FOUR TIMES DAILY AS NEEDED FOR SLEEP OR ANXIETY 360 tablet 0  . aspirin (BAYER ASPIRIN) 325 MG tablet Take 0.5 tablets (162 mg total) by mouth daily. 100 tablet 3  . b complex vitamins tablet Take 1 tablet by mouth daily. 100 tablet 3  . furosemide (LASIX) 20 MG tablet Take 1 tablet (20 mg total) by mouth 2 (two) times daily as needed for edema. 90 tablet 1  . hydrochlorothiazide (MICROZIDE) 12.5 MG capsule TAKE 1 CAPSULE(12.5 MG) BY MOUTH DAILY 90 capsule 3  . levalbuterol (XOPENEX HFA) 45 MCG/ACT inhaler Inhale 2 puffs into the lungs every 6 (six) hours as needed for wheezing. 1 Inhaler 11  . lipase/protease/amylase (CREON) 36000 UNITS CPEP capsule Take 1 capsule (36,000 Units total) by mouth 3 (three) times daily before meals. 90 capsule 11  . pantoprazole (PROTONIX) 40 MG tablet Take 1 tablet (40 mg total) by mouth daily. 90 tablet 3  . potassium chloride (K-DUR) 10 MEQ tablet Take qd prn with Lasix 90 tablet 1  . roflumilast (DALIRESP) 500 MCG TABS tablet Take 1 tablet (500 mcg total) by mouth daily. 90 tablet 5  . Simethicone (GAS-X PO) Take by mouth.    . temazepam (RESTORIL) 15 MG capsule TAKE 1 TO 2 CAPSULES BY MOUTH EVERY NIGHT AT BEDTIME AS NEEDED FOR INSOMNIA 180 capsule 1  . Tiotropium Bromide Monohydrate (SPIRIVA RESPIMAT) 2.5 MCG/ACT AERS Inhale 2 puffs into the lungs daily. 1 Inhaler 11  . vortioxetine HBr (TRINTELLIX) 10 MG TABS tablet Take 1 tablet (10 mg total) by mouth daily. 30 tablet 5   No facility-administered medications prior to visit.     ROS: Review of Systems  Constitutional: Negative for activity change, appetite change, chills,  fatigue and unexpected weight change.  HENT: Negative for congestion, mouth sores, sinus pressure and sore throat.   Eyes: Negative for visual disturbance.  Respiratory: Negative for cough and chest tightness.   Cardiovascular: Negative for chest pain and leg swelling.  Gastrointestinal: Negative for abdominal pain and nausea.  Genitourinary: Negative for difficulty urinating, frequency and vaginal pain.  Musculoskeletal: Positive for arthralgias. Negative for back pain and gait problem.  Skin: Negative for pallor and rash.  Neurological: Negative for dizziness, tremors, weakness, numbness and headaches.  Hematological: Negative for adenopathy. Does not bruise/bleed easily.  Psychiatric/Behavioral: Positive for dysphoric mood. Negative for confusion, decreased concentration, sleep disturbance and suicidal ideas. The patient is nervous/anxious.     Objective:  BP (!) 160/90 (BP Location: Left Arm, Patient Position: Sitting, Cuff Size: Normal)   Pulse 86   Temp 98.1 F (36.7 C) (Oral)   Ht 5\' 1"  (1.549 m)   Wt 154 lb (69.9 kg)   SpO2 97%   BMI 29.10 kg/m   BP Readings from Last 3 Encounters:  05/27/19 (!) 160/90  04/03/19 (!) 154/80  11/23/18 122/78    Wt Readings from Last 3 Encounters:  05/27/19 154 lb (69.9 kg)  04/03/19 157 lb (71.2 kg)  10/29/18 151 lb (68.5 kg)    Physical Exam Constitutional:      General: She is not in acute distress.    Appearance: She is  well-developed.  HENT:     Head: Normocephalic.     Right Ear: External ear normal.     Left Ear: External ear normal.     Nose: Nose normal.  Eyes:     General:        Right eye: No discharge.        Left eye: No discharge.     Conjunctiva/sclera: Conjunctivae normal.     Pupils: Pupils are equal, round, and reactive to light.  Neck:     Musculoskeletal: Normal range of motion and neck supple.     Thyroid: No thyromegaly.     Vascular: No JVD.     Trachea: No tracheal deviation.  Cardiovascular:      Rate and Rhythm: Normal rate and regular rhythm.     Heart sounds: Normal heart sounds.  Pulmonary:     Effort: No respiratory distress.     Breath sounds: No stridor. No wheezing.  Abdominal:     General: Bowel sounds are normal. There is no distension.     Palpations: Abdomen is soft. There is no mass.     Tenderness: There is no abdominal tenderness. There is no guarding or rebound.  Musculoskeletal:        General: No tenderness.  Lymphadenopathy:     Cervical: No cervical adenopathy.  Skin:    Findings: No erythema or rash.  Neurological:     Cranial Nerves: No cranial nerve deficit.     Motor: No abnormal muscle tone.     Coordination: Coordination normal.     Deep Tendon Reflexes: Reflexes normal.  Psychiatric:        Behavior: Behavior normal.        Thought Content: Thought content normal.        Judgment: Judgment normal.     Lab Results  Component Value Date   WBC 7.5 04/03/2019   HGB 11.4 (L) 04/03/2019   HCT 35.4 (L) 04/03/2019   PLT 208.0 04/03/2019   GLUCOSE 109 (H) 04/03/2019   CHOL 234 (H) 10/29/2018   TRIG 167.0 (H) 10/29/2018   HDL 37.00 (L) 10/29/2018   LDLDIRECT 162.8 06/28/2011   LDLCALC 163 (H) 10/29/2018   ALT 16 04/03/2019   AST 23 04/03/2019   NA 140 04/03/2019   K 3.9 04/03/2019   CL 103 04/03/2019   CREATININE 0.63 04/03/2019   BUN 12 04/03/2019   CO2 28 04/03/2019   TSH 1.32 04/03/2019   HGBA1C 5.6 01/02/2008    No results found.  Assessment & Plan:   There are no diagnoses linked to this encounter.   No orders of the defined types were placed in this encounter.    Follow-up: No follow-ups on file.  Walker Kehr, MD

## 2019-07-25 ENCOUNTER — Other Ambulatory Visit: Payer: Self-pay

## 2019-07-25 ENCOUNTER — Ambulatory Visit (INDEPENDENT_AMBULATORY_CARE_PROVIDER_SITE_OTHER): Payer: Medicare HMO

## 2019-07-25 DIAGNOSIS — Z23 Encounter for immunization: Secondary | ICD-10-CM | POA: Diagnosis not present

## 2019-07-30 DIAGNOSIS — H524 Presbyopia: Secondary | ICD-10-CM | POA: Diagnosis not present

## 2019-07-30 DIAGNOSIS — H5203 Hypermetropia, bilateral: Secondary | ICD-10-CM | POA: Diagnosis not present

## 2019-07-30 DIAGNOSIS — H2513 Age-related nuclear cataract, bilateral: Secondary | ICD-10-CM | POA: Diagnosis not present

## 2019-08-05 ENCOUNTER — Ambulatory Visit: Payer: Medicare HMO | Admitting: Internal Medicine

## 2019-08-08 ENCOUNTER — Ambulatory Visit: Payer: Medicare HMO | Admitting: Internal Medicine

## 2019-08-27 ENCOUNTER — Other Ambulatory Visit (HOSPITAL_COMMUNITY): Payer: Self-pay | Admitting: Internal Medicine

## 2019-08-27 DIAGNOSIS — I6523 Occlusion and stenosis of bilateral carotid arteries: Secondary | ICD-10-CM

## 2019-08-30 ENCOUNTER — Ambulatory Visit (HOSPITAL_COMMUNITY)
Admission: RE | Admit: 2019-08-30 | Discharge: 2019-08-30 | Disposition: A | Discharge status: Home / Self Care | Payer: Medicare HMO | Source: Ambulatory Visit | Attending: Cardiology | Admitting: Cardiology

## 2019-08-30 ENCOUNTER — Encounter (HOSPITAL_COMMUNITY): Payer: Medicare HMO

## 2019-08-30 ENCOUNTER — Other Ambulatory Visit (HOSPITAL_COMMUNITY): Payer: Self-pay | Admitting: Internal Medicine

## 2019-08-30 ENCOUNTER — Other Ambulatory Visit: Payer: Self-pay

## 2019-08-30 DIAGNOSIS — I6523 Occlusion and stenosis of bilateral carotid arteries: Secondary | ICD-10-CM | POA: Diagnosis not present

## 2019-10-07 ENCOUNTER — Telehealth: Payer: Self-pay | Admitting: Internal Medicine

## 2019-10-07 MED ORDER — TEMAZEPAM 15 MG PO CAPS: ORAL_CAPSULE | ORAL | 1 refills | Status: DC

## 2019-10-07 MED ORDER — LEVALBUTEROL TARTRATE 45 MCG/ACT IN AERO: 2.0000 | INHALATION_SPRAY | Freq: Four times a day (QID) | RESPIRATORY_TRACT | 3 refills | Status: DC | PRN

## 2019-10-07 NOTE — Telephone Encounter (Signed)
Pt needs MDI and Temazepam

## 2019-10-14 ENCOUNTER — Ambulatory Visit: Payer: Medicare HMO | Admitting: Internal Medicine

## 2019-10-17 ENCOUNTER — Other Ambulatory Visit: Payer: Self-pay

## 2019-10-17 ENCOUNTER — Ambulatory Visit (INDEPENDENT_AMBULATORY_CARE_PROVIDER_SITE_OTHER): Payer: Medicare Other | Admitting: Internal Medicine

## 2019-10-17 ENCOUNTER — Encounter: Payer: Self-pay | Admitting: Internal Medicine

## 2019-10-17 VITALS — BP 166/72 | HR 93 | Temp 98.1°F | Ht 61.0 in | Wt 150.0 lb

## 2019-10-17 DIAGNOSIS — J449 Chronic obstructive pulmonary disease, unspecified: Secondary | ICD-10-CM | POA: Diagnosis not present

## 2019-10-17 DIAGNOSIS — D509 Iron deficiency anemia, unspecified: Secondary | ICD-10-CM | POA: Diagnosis not present

## 2019-10-17 DIAGNOSIS — R140 Abdominal distension (gaseous): Secondary | ICD-10-CM | POA: Insufficient documentation

## 2019-10-17 DIAGNOSIS — R519 Headache, unspecified: Secondary | ICD-10-CM | POA: Insufficient documentation

## 2019-10-17 DIAGNOSIS — F4323 Adjustment disorder with mixed anxiety and depressed mood: Secondary | ICD-10-CM

## 2019-10-17 DIAGNOSIS — K2101 Gastro-esophageal reflux disease with esophagitis, with bleeding: Secondary | ICD-10-CM

## 2019-10-17 DIAGNOSIS — G44229 Chronic tension-type headache, not intractable: Secondary | ICD-10-CM

## 2019-10-17 LAB — CBC WITH DIFFERENTIAL/PLATELET
Basophils Absolute: 0.1 10*3/uL (ref 0.0–0.1)
Basophils Relative: 1 % (ref 0.0–3.0)
Eosinophils Absolute: 0.4 10*3/uL (ref 0.0–0.7)
Eosinophils Relative: 5.4 % — ABNORMAL HIGH (ref 0.0–5.0)
HCT: 30.6 % — ABNORMAL LOW (ref 36.0–46.0)
Hemoglobin: 9.2 g/dL — ABNORMAL LOW (ref 12.0–15.0)
Lymphocytes Relative: 34.4 % (ref 12.0–46.0)
Lymphs Abs: 2.5 10*3/uL (ref 0.7–4.0)
MCHC: 30.1 g/dL (ref 30.0–36.0)
MCV: 64.1 fl — ABNORMAL LOW (ref 78.0–100.0)
Monocytes Absolute: 0.5 10*3/uL (ref 0.1–1.0)
Monocytes Relative: 7.6 % (ref 3.0–12.0)
Neutro Abs: 3.7 10*3/uL (ref 1.4–7.7)
Neutrophils Relative %: 51.6 % (ref 43.0–77.0)
Platelets: 294 10*3/uL (ref 150.0–400.0)
RBC: 4.77 Mil/uL (ref 3.87–5.11)
RDW: 17.6 % — ABNORMAL HIGH (ref 11.5–15.5)
WBC: 7.2 10*3/uL (ref 4.0–10.5)

## 2019-10-17 LAB — BASIC METABOLIC PANEL
BUN: 12 mg/dL (ref 6–23)
CO2: 30 mEq/L (ref 19–32)
Calcium: 9.8 mg/dL (ref 8.4–10.5)
Chloride: 104 mEq/L (ref 96–112)
Creatinine, Ser: 0.68 mg/dL (ref 0.40–1.20)
GFR: 84.54 mL/min (ref >60.00)
Glucose, Bld: 108 mg/dL — ABNORMAL HIGH (ref 70–99)
Potassium: 3.5 mEq/L (ref 3.5–5.1)
Sodium: 141 mEq/L (ref 135–145)

## 2019-10-17 LAB — TSH: TSH: 0.79 u[IU]/mL (ref 0.35–4.50)

## 2019-10-17 MED ORDER — SPIRIVA RESPIMAT 2.5 MCG/ACT IN AERS: 2.0000 | INHALATION_SPRAY | Freq: Every day | RESPIRATORY_TRACT | 11 refills | Status: AC

## 2019-10-17 NOTE — Assessment & Plan Note (Signed)
Xanax prn  Potential benefits of a long term benzodiazepines  use as well as potential risks  and complications were explained to the patient and were aknowledged. 

## 2019-10-17 NOTE — Assessment & Plan Note (Signed)
Activated Charcoal Capsules Probiotics - [employer]

## 2019-10-17 NOTE — Assessment & Plan Note (Signed)
CBC, iron 

## 2019-10-17 NOTE — Patient Instructions (Signed)
Activated Charcoal Capsules Probiotics - [employer]

## 2019-10-17 NOTE — Assessment & Plan Note (Signed)
Head CT if not better MRI 2014

## 2019-10-17 NOTE — Assessment & Plan Note (Signed)
Protonix Potential benefits of a long term PPI use as well as potential risks  and complications were explained to the patient and were aknowledged. 

## 2019-10-17 NOTE — Progress Notes (Signed)
Subjective:  Patient ID: Theresa Aguirre, female    DOB: [birth year]  Age: 74 y.o. MRN: [id number]  CC: No chief complaint on file.   HPI Theresa Aguirre presents for COPD, anxiety, OA f/u C/o DOE C/o HAs  Outpatient Medications Prior to Visit  Medication Sig Dispense Refill  . ALPRAZolam (XANAX) 1 MG tablet TAKE 1 TABLET BY MOUTH FOUR TIMES DAILY AS NEEDED FOR SLEEP OR ANXIETY 360 tablet 0  . aspirin (BAYER ASPIRIN) 325 MG tablet Take 0.5 tablets (162 mg total) by mouth daily. 100 tablet 3  . b complex vitamins tablet Take 1 tablet by mouth daily. 100 tablet 3  . cefUROXime (CEFTIN) 250 MG tablet Take 1 tablet (250 mg total) by mouth 2 (two) times daily. 14 tablet 0  . clobetasol cream (TEMOVATE) [id number] % Apply 1 application topically 2 (two) times daily. 60 g 3  . furosemide (LASIX) 20 MG tablet Take 1 tablet (20 mg total) by mouth 2 (two) times daily as needed for edema. 90 tablet 1  . hydrochlorothiazide (MICROZIDE) 12.5 MG capsule TAKE 1 CAPSULE(12.5 MG) BY MOUTH DAILY 90 capsule 3  . levalbuterol (XOPENEX HFA) 45 MCG/ACT inhaler Inhale 2 puffs into the lungs every 6 (six) hours as needed for wheezing or shortness of breath. 3 Inhaler 3  . lipase/protease/amylase (CREON) 36000 UNITS CPEP capsule Take 1 capsule (36,000 Units total) by mouth 3 (three) times daily before meals. 90 capsule 11  . pantoprazole (PROTONIX) 40 MG tablet Take 1 tablet (40 mg total) by mouth daily. 90 tablet 3  . potassium chloride (K-DUR) 10 MEQ tablet Take qd prn with Lasix 90 tablet 1  . roflumilast (DALIRESP) 500 MCG TABS tablet Take 1 tablet (500 mcg total) by mouth daily. 90 tablet 5  . Simethicone (GAS-X PO) Take by mouth.    . temazepam (RESTORIL) 15 MG capsule 1-2 po qhs prn 180 capsule 1  . Tiotropium Bromide Monohydrate (SPIRIVA RESPIMAT) 2.5 MCG/ACT AERS Inhale 2 puffs into the lungs daily. 1 Inhaler 11  . vortioxetine HBr (TRINTELLIX) 10 MG TABS tablet Take 1 tablet (10 mg total) by mouth daily. 30  tablet 5   No facility-administered medications prior to visit.    ROS: Review of Systems  Constitutional: Positive for fatigue. Negative for activity change, appetite change, chills and unexpected weight change.  HENT: Negative for congestion, mouth sores and sinus pressure.   Eyes: Negative for visual disturbance.  Respiratory: Positive for shortness of breath and wheezing. Negative for cough and chest tightness.   Gastrointestinal: Negative for abdominal pain and nausea.  Genitourinary: Negative for difficulty urinating, frequency and vaginal pain.  Musculoskeletal: Positive for arthralgias, back pain and gait problem.  Skin: Negative for pallor and rash.  Neurological: Negative for dizziness, tremors, weakness, numbness and headaches.  Psychiatric/Behavioral: Negative for confusion, sleep disturbance and suicidal ideas.    Objective:  BP (!) 166/72 (BP Location: Left Arm, Patient Position: Sitting, Cuff Size: Normal)   Pulse 93   Temp 98.1 F (36.7 C) (Oral)   Ht 5\' 1"  (1.549 m)   Wt 150 lb (68 kg)   SpO2 99%   BMI 28.34 kg/m   BP Readings from Last 3 Encounters:  10/17/19 (!) 166/72  05/27/19 (!) 160/90  04/03/19 (!) 154/80    Wt Readings from Last 3 Encounters:  10/17/19 150 lb (68 kg)  05/27/19 154 lb (69.9 kg)  04/03/19 157 lb (71.2 kg)    Physical Exam Constitutional:      General:  She is not in acute distress.    Appearance: She is well-developed.  HENT:     Head: Normocephalic.     Right Ear: External ear normal.     Left Ear: External ear normal.     Nose: Nose normal.  Eyes:     General:        Right eye: No discharge.        Left eye: No discharge.     Conjunctiva/sclera: Conjunctivae normal.     Pupils: Pupils are equal, round, and reactive to light.  Neck:     Thyroid: No thyromegaly.     Vascular: No JVD.     Trachea: No tracheal deviation.  Cardiovascular:     Rate and Rhythm: Normal rate and regular rhythm.     Heart sounds: Normal  heart sounds.  Pulmonary:     Effort: No respiratory distress.     Breath sounds: No stridor. No wheezing.  Abdominal:     General: Bowel sounds are normal. There is no distension.     Palpations: Abdomen is soft. There is no mass.     Tenderness: There is no abdominal tenderness. There is no guarding or rebound.  Musculoskeletal:        General: Tenderness present.     Cervical back: Normal range of motion and neck supple.  Lymphadenopathy:     Cervical: No cervical adenopathy.  Skin:    Findings: No erythema or rash.  Neurological:     Mental Status: She is oriented to person, place, and time.     Cranial Nerves: No cranial nerve deficit.     Motor: No abnormal muscle tone.     Coordination: Coordination normal.     Deep Tendon Reflexes: Reflexes normal.  Psychiatric:        Behavior: Behavior normal.        Thought Content: Thought content normal.        Judgment: Judgment normal.     Lab Results  Component Value Date   WBC 7.5 04/03/2019   HGB 11.4 (L) 04/03/2019   HCT 35.4 (L) 04/03/2019   PLT 208.0 04/03/2019   GLUCOSE 109 (H) 04/03/2019   CHOL 234 (H) 10/29/2018   TRIG 167.0 (H) 10/29/2018   HDL 37.00 (L) 10/29/2018   LDLDIRECT 162.8 06/28/2011   LDLCALC 163 (H) 10/29/2018   ALT 16 04/03/2019   AST 23 04/03/2019   NA 140 04/03/2019   K 3.9 04/03/2019   CL 103 04/03/2019   CREATININE 0.63 04/03/2019   BUN 12 04/03/2019   CO2 28 04/03/2019   TSH 1.32 04/03/2019   HGBA1C 5.6 01/02/2008    VAS US CAROTID  Result Date: 08/31/2019 Carotid Arterial Duplex Study Indications:       Carotid artery disease and follow up. Patient complains of                    intermittent headaches for about 3 years. She also has                    numbness down her right arm which she was diagnosed with                    narrowing of C6-C7 intervertebral disc space in 04/2010.                    Patient denies any other cerebrovascular symptoms today. Risk Factors:  Hyperlipidemia, current smoker. Comparison Study:  Prior carotid duplex exam on 09/13/2018 showed velocities in                    right proximal ICA 145/42 cm/s and left proximal ICA 107/24                    cm/s. Performing Technologist: Salvadore Dom RVT, RDCS (AE), RDMS  Examination Guidelines: A complete evaluation includes B-mode imaging, spectral Doppler, color Doppler, and power Doppler as needed of all accessible portions of each vessel. Bilateral testing is considered an integral part of a complete examination. Limited examinations for reoccurring indications may be performed as noted.  Right Carotid Findings: +----------+--------+--------+--------+-------------------------+--------------+           PSV cm/sEDV cm/sStenosisPlaque Description       Comments       +----------+--------+--------+--------+-------------------------+--------------+ CCA Prox  154     21                                                      +----------+--------+--------+--------+-------------------------+--------------+ CCA Distal79      22                                                      +----------+--------+--------+--------+-------------------------+--------------+ ICA Prox  214     62      40-59%  heterogenous, irregular  high end range                                   and calcific                            +----------+--------+--------+--------+-------------------------+--------------+ ICA Mid   139     44                                                      +----------+--------+--------+--------+-------------------------+--------------+ ICA Distal80      27                                                      +----------+--------+--------+--------+-------------------------+--------------+ ECA       128     23                                                      +----------+--------+--------+--------+-------------------------+--------------+  +----------+--------+-------+----------------+-------------------+           PSV cm/sEDV cmsDescribe        Arm Pressure (mmHG) +----------+--------+-------+----------------+-------------------+ [id number]            Multiphasic, [id number]                 +----------+--------+-------+----------------+-------------------+ +---------+--------+--+--------+--+---------+  VertebralPSV cm/s60EDV cm/s19Antegrade +---------+--------+--+--------+--+---------+  Left Carotid Findings: +----------+-------+-------+--------+---------------------------------+--------+           PSV    EDV    StenosisPlaque Description               Comments           cm/s   cm/s                                                     +----------+-------+-------+--------+---------------------------------+--------+ CCA Prox  129    33                                                       +----------+-------+-------+--------+---------------------------------+--------+ CCA Distal101    23                                                       +----------+-------+-------+--------+---------------------------------+--------+ ICA Prox  91     26             heterogenous, irregular and                                               calcific                                  +----------+-------+-------+--------+---------------------------------+--------+ ICA Mid   105    32     1-39%                                             +----------+-------+-------+--------+---------------------------------+--------+ ICA Distal81     18                                                       +----------+-------+-------+--------+---------------------------------+--------+ ECA       87     12                                                       +----------+-------+-------+--------+---------------------------------+--------+ +----------+--------+--------+----------------+-------------------+            PSV cm/sEDV cm/sDescribe        Arm Pressure (mmHG) +----------+--------+--------+----------------+-------------------+ [id number]             Multiphasic, [id number]                 +----------+--------+--------+----------------+-------------------+ +---------+--------+--+--------+--+---------+ VertebralPSV cm/s62EDV cm/s17Antegrade +---------+--------+--+--------+--+---------+  Summary: Right Carotid: The velocities in the  ICA have increased since prior exam and is                in the high end range of 40-59% stenosis. Left Carotid: Velocities in the left ICA are consistent with a 1-39% stenosis. Vertebrals:  Bilateral vertebral arteries demonstrate antegrade flow. Subclavians: Normal flow hemodynamics were seen in bilateral subclavian              arteries. *See table(s) above for measurements and observations. Suggest follow up study in 12 months. Electronically signed by Ena Dawley MD on 08/31/2019 at 5:00:12 PM.    Final     Assessment & Plan:   There are no diagnoses linked to this encounter.   No orders of the defined types were placed in this encounter.    Follow-up: No follow-ups on file.  Walker Kehr, MD

## 2019-10-17 NOTE — Assessment & Plan Note (Signed)
Xopenex prn, Spiriva Respimat

## 2019-10-18 ENCOUNTER — Other Ambulatory Visit: Payer: Self-pay | Admitting: Internal Medicine

## 2019-10-18 LAB — IRON,TIBC AND FERRITIN PANEL
%SAT: 3 % (calc) — ABNORMAL LOW (ref 16–45)
Ferritin: 5 ng/mL — ABNORMAL LOW (ref 16–288)
Iron: 14 ug/dL — ABNORMAL LOW (ref 45–160)
TIBC: 455 mcg/dL (calc) — ABNORMAL HIGH (ref 250–450)

## 2019-10-18 MED ORDER — FERROUS SULFATE 325 (65 FE) MG PO TABS: 325.0000 mg | ORAL_TABLET | Freq: Every day | ORAL | 6 refills | Status: DC

## 2019-11-11 ENCOUNTER — Telehealth: Payer: Self-pay | Admitting: Internal Medicine

## 2019-11-11 NOTE — Telephone Encounter (Signed)
    Pt c/o medication issue:  1. Name of Medication: ferrous sulfate 325 (65 FE) MG tablet  2. How are you currently taking this medication (dosage and times per day)? As written  3. Are you having a reaction (difficulty breathing--STAT)? no  4. What is your medication issue? Patient states medication may not be helping. Patient wants to know if she needs to continue med.

## 2019-11-12 NOTE — Telephone Encounter (Signed)
Pt would like to know how long she is supposed to take the medication for and when she should get blood work redone. Please advise

## 2019-11-13 NOTE — Telephone Encounter (Signed)
Called pt there was no answer LMOM w/MD response../lmb 

## 2019-11-13 NOTE — Telephone Encounter (Signed)
3-6 months CBC, iron in 3 mo pls Thx

## 2019-12-16 ENCOUNTER — Other Ambulatory Visit: Payer: Self-pay | Admitting: Internal Medicine

## 2020-01-10 ENCOUNTER — Other Ambulatory Visit: Payer: Self-pay | Admitting: Internal Medicine

## 2020-01-28 ENCOUNTER — Other Ambulatory Visit: Payer: Self-pay

## 2020-01-28 ENCOUNTER — Encounter: Payer: Self-pay | Admitting: Internal Medicine

## 2020-01-28 ENCOUNTER — Ambulatory Visit (INDEPENDENT_AMBULATORY_CARE_PROVIDER_SITE_OTHER): Payer: Medicare Other | Admitting: Internal Medicine

## 2020-01-28 VITALS — BP 136/78 | HR 77 | Temp 98.0°F | Ht 61.0 in | Wt 147.0 lb

## 2020-01-28 DIAGNOSIS — R7309 Other abnormal glucose: Secondary | ICD-10-CM

## 2020-01-28 DIAGNOSIS — J449 Chronic obstructive pulmonary disease, unspecified: Secondary | ICD-10-CM | POA: Diagnosis not present

## 2020-01-28 DIAGNOSIS — E559 Vitamin D deficiency, unspecified: Secondary | ICD-10-CM

## 2020-01-28 DIAGNOSIS — D509 Iron deficiency anemia, unspecified: Secondary | ICD-10-CM

## 2020-01-28 DIAGNOSIS — K2101 Gastro-esophageal reflux disease with esophagitis, with bleeding: Secondary | ICD-10-CM | POA: Diagnosis not present

## 2020-01-28 DIAGNOSIS — R634 Abnormal weight loss: Secondary | ICD-10-CM

## 2020-01-28 DIAGNOSIS — R202 Paresthesia of skin: Secondary | ICD-10-CM | POA: Diagnosis not present

## 2020-01-28 DIAGNOSIS — E785 Hyperlipidemia, unspecified: Secondary | ICD-10-CM | POA: Diagnosis not present

## 2020-01-28 LAB — VITAMIN D 25 HYDROXY (VIT D DEFICIENCY, FRACTURES): VITD: 77.92 ng/mL (ref 30.00–100.00)

## 2020-01-28 LAB — CBC WITH DIFFERENTIAL/PLATELET
Basophils Absolute: 0.1 10*3/uL (ref 0.0–0.1)
Basophils Relative: 1.2 % (ref 0.0–3.0)
Eosinophils Absolute: 0.4 10*3/uL (ref 0.0–0.7)
Eosinophils Relative: 5.6 % — ABNORMAL HIGH (ref 0.0–5.0)
HCT: 44.4 % (ref 36.0–46.0)
Hemoglobin: 14.5 g/dL (ref 12.0–15.0)
Lymphocytes Relative: 41.2 % (ref 12.0–46.0)
Lymphs Abs: 3.1 10*3/uL (ref 0.7–4.0)
MCHC: 32.6 g/dL (ref 30.0–36.0)
MCV: 82.7 fl (ref 78.0–100.0)
Monocytes Absolute: 0.6 10*3/uL (ref 0.1–1.0)
Monocytes Relative: 8.2 % (ref 3.0–12.0)
Neutro Abs: 3.3 10*3/uL (ref 1.4–7.7)
Neutrophils Relative %: 43.8 % (ref 43.0–77.0)
Platelets: 200 10*3/uL (ref 150.0–400.0)
RBC: 5.37 Mil/uL — ABNORMAL HIGH (ref 3.87–5.11)
RDW: 15 % (ref 11.5–15.5)
WBC: 7.5 10*3/uL (ref 4.0–10.5)

## 2020-01-28 LAB — BASIC METABOLIC PANEL
BUN: 12 mg/dL (ref 6–23)
CO2: 33 mEq/L — ABNORMAL HIGH (ref 19–32)
Calcium: 9.8 mg/dL (ref 8.4–10.5)
Chloride: 101 mEq/L (ref 96–112)
Creatinine, Ser: 0.68 mg/dL (ref 0.40–1.20)
GFR: 84.48 mL/min (ref >60.00)
Glucose, Bld: 90 mg/dL (ref 70–99)
Potassium: 4.1 mEq/L (ref 3.5–5.1)
Sodium: 139 mEq/L (ref 135–145)

## 2020-01-28 LAB — HEPATIC FUNCTION PANEL
ALT: 17 U/L (ref 0–35)
AST: 24 U/L (ref 0–37)
Albumin: 4.5 g/dL (ref 3.5–5.2)
Alkaline Phosphatase: 67 U/L (ref 39–117)
Bilirubin, Direct: 0.1 mg/dL (ref 0.0–0.3)
Total Bilirubin: 0.5 mg/dL (ref 0.2–1.2)
Total Protein: 7.4 g/dL (ref 6.0–8.3)

## 2020-01-28 LAB — HEMOGLOBIN A1C: Hgb A1c MFr Bld: 5.5 % (ref 4.6–6.5)

## 2020-01-28 LAB — TSH: TSH: 1.68 u[IU]/mL (ref 0.35–4.50)

## 2020-01-28 LAB — VITAMIN B12: Vitamin B-12: 772 pg/mL (ref 211–911)

## 2020-01-28 LAB — T4, FREE: Free T4: 0.64 ng/dL (ref 0.60–1.60)

## 2020-01-28 NOTE — Progress Notes (Signed)
Subjective:  Patient ID: Theresa Aguirre, female    DOB: 1946-08-30  Age: 74 y.o. MRN: [id number]  CC: No chief complaint on file.   HPI Theresa Aguirre presents for COPD, anxiety, wt loss C/o occ diarrhea  Outpatient Medications Prior to Visit  Medication Sig Dispense Refill  . ALPRAZolam (XANAX) 1 MG tablet TAKE 1 TABLET BY MOUTH FOUR TIMES DAILY AS NEEDED FOR SLEEP OR ANXIETY 360 tablet 1  . aspirin (BAYER ASPIRIN) 325 MG tablet Take 0.5 tablets (162 mg total) by mouth daily. 100 tablet 3  . b complex vitamins tablet Take 1 tablet by mouth daily. 100 tablet 3  . clobetasol cream (TEMOVATE) [id number] % Apply 1 application topically 2 (two) times daily. 60 g 3  . ferrous sulfate 325 (65 FE) MG tablet Take 1 tablet (325 mg total) by mouth daily. 30 tablet 6  . furosemide (LASIX) 20 MG tablet Take 1 tablet (20 mg total) by mouth 2 (two) times daily as needed for edema. 90 tablet 1  . hydrochlorothiazide (MICROZIDE) 12.5 MG capsule TAKE 1 CAPSULE(12.5 MG) BY MOUTH DAILY 90 capsule 3  . levalbuterol (XOPENEX HFA) 45 MCG/ACT inhaler Inhale 2 puffs into the lungs every 6 (six) hours as needed for wheezing or shortness of breath. 3 Inhaler 3  . lipase/protease/amylase (CREON) 36000 UNITS CPEP capsule Take 1 capsule (36,000 Units total) by mouth 3 (three) times daily before meals. 90 capsule 11  . pantoprazole (PROTONIX) 40 MG tablet Take 1 tablet (40 mg total) by mouth daily. 90 tablet 3  . potassium chloride (K-DUR) 10 MEQ tablet Take qd prn with Lasix 90 tablet 1  . roflumilast (DALIRESP) 500 MCG TABS tablet Take 1 tablet (500 mcg total) by mouth daily. 90 tablet 5  . Simethicone (GAS-X PO) Take by mouth.    . temazepam (RESTORIL) 15 MG capsule 1-2 po qhs prn 180 capsule 1  . Tiotropium Bromide Monohydrate (SPIRIVA RESPIMAT) 2.5 MCG/ACT AERS Inhale 2 puffs into the lungs daily. 4 g 11  . vortioxetine HBr (TRINTELLIX) 10 MG TABS tablet Take 1 tablet (10 mg total) by mouth daily. 30 tablet 5  .  cefUROXime (CEFTIN) 250 MG tablet Take 1 tablet (250 mg total) by mouth 2 (two) times daily. 14 tablet 0   No facility-administered medications prior to visit.    ROS: Review of Systems  Constitutional: Positive for fatigue. Negative for activity change, appetite change, chills and unexpected weight change.  HENT: Negative for congestion, mouth sores and sinus pressure.   Eyes: Negative for visual disturbance.  Respiratory: Negative for cough and chest tightness.   Gastrointestinal: Negative for abdominal pain and nausea.  Genitourinary: Negative for difficulty urinating, frequency and vaginal pain.  Musculoskeletal: Negative for back pain and gait problem.  Skin: Negative for pallor and rash.  Neurological: Negative for dizziness, tremors, weakness, numbness and headaches.  Psychiatric/Behavioral: Positive for sleep disturbance. Negative for confusion and suicidal ideas. The patient is nervous/anxious.     Objective:  BP 136/78 (BP Location: Left Arm, Patient Position: Sitting, Cuff Size: Normal)   Pulse 77   Temp 98 F (36.7 C) (Oral)   Ht 5\' 1"  (1.549 m)   Wt 147 lb (66.7 kg)   SpO2 97%   BMI 27.78 kg/m   BP Readings from Last 3 Encounters:  01/28/20 136/78  10/17/19 (!) 166/72  05/27/19 (!) 160/90    Wt Readings from Last 3 Encounters:  01/28/20 147 lb (66.7 kg)  10/17/19 150 lb (68 kg)  05/27/19 154 lb (69.9 kg)    Physical Exam Constitutional:      General: She is not in acute distress.    Appearance: She is well-developed.  HENT:     Head: Normocephalic.     Right Ear: External ear normal.     Left Ear: External ear normal.     Nose: Nose normal.  Eyes:     General:        Right eye: No discharge.        Left eye: No discharge.     Conjunctiva/sclera: Conjunctivae normal.     Pupils: Pupils are equal, round, and reactive to light.  Neck:     Thyroid: No thyromegaly.     Vascular: No JVD.     Trachea: No tracheal deviation.  Cardiovascular:     Rate  and Rhythm: Normal rate and regular rhythm.     Heart sounds: Normal heart sounds.  Pulmonary:     Effort: No respiratory distress.     Breath sounds: No stridor. No wheezing.  Abdominal:     General: Bowel sounds are normal. There is no distension.     Palpations: Abdomen is soft. There is no mass.     Tenderness: There is no abdominal tenderness. There is no guarding or rebound.  Musculoskeletal:        General: No tenderness.     Cervical back: Normal range of motion and neck supple.  Lymphadenopathy:     Cervical: No cervical adenopathy.  Skin:    Findings: No erythema or rash.  Neurological:     Mental Status: She is oriented to person, place, and time.     Cranial Nerves: No cranial nerve deficit.     Motor: No abnormal muscle tone.     Coordination: Coordination normal.     Deep Tendon Reflexes: Reflexes normal.  Psychiatric:        Behavior: Behavior normal.        Thought Content: Thought content normal.        Judgment: Judgment normal.     Lab Results  Component Value Date   WBC 7.2 10/17/2019   HGB 9.2 (L) 10/17/2019   HCT 30.6 (L) 10/17/2019   PLT 294.0 10/17/2019   GLUCOSE 108 (H) 10/17/2019   CHOL 234 (H) 10/29/2018   TRIG 167.0 (H) 10/29/2018   HDL 37.00 (L) 10/29/2018   LDLDIRECT 162.8 06/28/2011   LDLCALC 163 (H) 10/29/2018   ALT 16 04/03/2019   AST 23 04/03/2019   NA 141 10/17/2019   K 3.5 10/17/2019   CL 104 10/17/2019   CREATININE 0.68 10/17/2019   BUN 12 10/17/2019   CO2 30 10/17/2019   TSH 0.79 10/17/2019   HGBA1C 5.6 01/02/2008    VAS US CAROTID  Result Date: 08/31/2019 Carotid Arterial Duplex Study Indications:       Carotid artery disease and follow up. Patient complains of                    intermittent headaches for about 3 years. She also has                    numbness down her right arm which she was diagnosed with                    narrowing of C6-C7 intervertebral disc space in 04/2010.  Patient denies any  other cerebrovascular symptoms today. Risk Factors:      Hyperlipidemia, current smoker. Comparison Study:  Prior carotid duplex exam on 09/13/2018 showed velocities in                    right proximal ICA 145/42 cm/s and left proximal ICA 107/24                    cm/s. Performing Technologist: Salvadore Dom RVT, RDCS (AE), RDMS  Examination Guidelines: A complete evaluation includes B-mode imaging, spectral Doppler, color Doppler, and power Doppler as needed of all accessible portions of each vessel. Bilateral testing is considered an integral part of a complete examination. Limited examinations for reoccurring indications may be performed as noted.  Right Carotid Findings: +----------+--------+--------+--------+-------------------------+--------------+           PSV cm/sEDV cm/sStenosisPlaque Description       Comments       +----------+--------+--------+--------+-------------------------+--------------+ CCA Prox  154     21                                                      +----------+--------+--------+--------+-------------------------+--------------+ CCA Distal79      22                                                      +----------+--------+--------+--------+-------------------------+--------------+ ICA Prox  214     62      40-59%  heterogenous, irregular  high end range                                   and calcific                            +----------+--------+--------+--------+-------------------------+--------------+ ICA Mid   139     44                                                      +----------+--------+--------+--------+-------------------------+--------------+ ICA Distal80      27                                                      +----------+--------+--------+--------+-------------------------+--------------+ ECA       128     23                                                       +----------+--------+--------+--------+-------------------------+--------------+ +----------+--------+-------+----------------+-------------------+           PSV cm/sEDV cmsDescribe        Arm Pressure (mmHG) +----------+--------+-------+----------------+-------------------+ [id number]  Multiphasic, [id number]                 +----------+--------+-------+----------------+-------------------+ +---------+--------+--+--------+--+---------+ VertebralPSV cm/s60EDV cm/s19Antegrade +---------+--------+--+--------+--+---------+  Left Carotid Findings: +----------+-------+-------+--------+---------------------------------+--------+           PSV    EDV    StenosisPlaque Description               Comments           cm/s   cm/s                                                     +----------+-------+-------+--------+---------------------------------+--------+ CCA Prox  129    33                                                       +----------+-------+-------+--------+---------------------------------+--------+ CCA Distal101    23                                                       +----------+-------+-------+--------+---------------------------------+--------+ ICA Prox  91     26             heterogenous, irregular and                                               calcific                                  +----------+-------+-------+--------+---------------------------------+--------+ ICA Mid   105    32     1-39%                                             +----------+-------+-------+--------+---------------------------------+--------+ ICA Distal81     18                                                       +----------+-------+-------+--------+---------------------------------+--------+ ECA       87     12                                                        +----------+-------+-------+--------+---------------------------------+--------+ +----------+--------+--------+----------------+-------------------+           PSV cm/sEDV cm/sDescribe        Arm Pressure (mmHG) +----------+--------+--------+----------------+-------------------+ [id number]             Multiphasic, [id number]                 +----------+--------+--------+----------------+-------------------+ +---------+--------+--+--------+--+---------+  VertebralPSV cm/s62EDV cm/s17Antegrade +---------+--------+--+--------+--+---------+  Summary: Right Carotid: The velocities in the ICA have increased since prior exam and is                in the high end range of 40-59% stenosis. Left Carotid: Velocities in the left ICA are consistent with a 1-39% stenosis. Vertebrals:  Bilateral vertebral arteries demonstrate antegrade flow. Subclavians: Normal flow hemodynamics were seen in bilateral subclavian              arteries. *See table(s) above for measurements and observations. Suggest follow up study in 12 months. Electronically signed by Ena Dawley MD on 08/31/2019 at 5:00:12 PM.    Final     Assessment & Plan:    Walker Kehr, MD

## 2020-01-28 NOTE — Assessment & Plan Note (Signed)
Protonix Potential benefits of a long term PPI use as well as potential risks  and complications were explained to the patient and were aknowledged. 

## 2020-01-28 NOTE — Addendum Note (Signed)
Addended by: Trenda Moots on: [id number] 02:31 PM   Modules accepted: Orders

## 2020-01-28 NOTE — Assessment & Plan Note (Signed)
TSH, FT4 

## 2020-01-28 NOTE — Assessment & Plan Note (Signed)
Pravachol

## 2020-01-28 NOTE — Assessment & Plan Note (Signed)
Daliresp

## 2020-01-28 NOTE — Assessment & Plan Note (Signed)
A1c TSH

## 2020-01-28 NOTE — Assessment & Plan Note (Addendum)
CBC, Iron On PO iron May need IV iron

## 2020-01-29 LAB — IRON,TIBC AND FERRITIN PANEL
%SAT: 12 % (calc) — ABNORMAL LOW (ref 16–45)
Ferritin: 28 ng/mL (ref 16–288)
Iron: 40 ug/dL — ABNORMAL LOW (ref 45–160)
TIBC: 337 mcg/dL (calc) (ref 250–450)

## 2020-05-07 ENCOUNTER — Other Ambulatory Visit: Payer: Self-pay | Admitting: Internal Medicine

## 2020-05-11 ENCOUNTER — Ambulatory Visit: Payer: Medicare Other

## 2020-05-11 ENCOUNTER — Encounter: Payer: Self-pay | Admitting: Internal Medicine

## 2020-05-11 ENCOUNTER — Other Ambulatory Visit: Payer: Self-pay

## 2020-05-11 ENCOUNTER — Ambulatory Visit (INDEPENDENT_AMBULATORY_CARE_PROVIDER_SITE_OTHER): Payer: Medicare Other | Admitting: Internal Medicine

## 2020-05-11 DIAGNOSIS — J449 Chronic obstructive pulmonary disease, unspecified: Secondary | ICD-10-CM | POA: Diagnosis not present

## 2020-05-11 DIAGNOSIS — I6523 Occlusion and stenosis of bilateral carotid arteries: Secondary | ICD-10-CM | POA: Diagnosis not present

## 2020-05-11 DIAGNOSIS — R634 Abnormal weight loss: Secondary | ICD-10-CM | POA: Diagnosis not present

## 2020-05-11 DIAGNOSIS — F331 Major depressive disorder, recurrent, moderate: Secondary | ICD-10-CM | POA: Diagnosis not present

## 2020-05-11 NOTE — Assessment & Plan Note (Signed)
Smoking very little Xopenex

## 2020-05-11 NOTE — Progress Notes (Signed)
Subjective:  Patient ID: Theresa Aguirre, female    DOB: 03-17-1946  Age: 74 y.o. MRN: [id number]  CC: No chief complaint on file.   HPI Theresa Aguirre presents for depression, COPD, HTN f/u Smoking very little  Outpatient Medications Prior to Visit  Medication Sig Dispense Refill  . ALPRAZolam (XANAX) 1 MG tablet TAKE 1 TABLET BY MOUTH FOUR TIMES DAILY AS NEEDED FOR SLEEP OR ANXIETY 360 tablet 1  . aspirin (BAYER ASPIRIN) 325 MG tablet Take 0.5 tablets (162 mg total) by mouth daily. 100 tablet 3  . b complex vitamins tablet Take 1 tablet by mouth daily. 100 tablet 3  . clobetasol cream (TEMOVATE) 4.09 % Apply 1 application topically 2 (two) times daily. 60 g 3  . FEROSUL 325 (65 Fe) MG tablet TAKE 1 TABLET(325 MG) BY MOUTH DAILY 30 tablet 6  . furosemide (LASIX) 20 MG tablet Take 1 tablet (20 mg total) by mouth 2 (two) times daily as needed for edema. 90 tablet 1  . hydrochlorothiazide (MICROZIDE) 12.5 MG capsule TAKE 1 CAPSULE(12.5 MG) BY MOUTH DAILY 90 capsule 3  . levalbuterol (XOPENEX HFA) 45 MCG/ACT inhaler Inhale 2 puffs into the lungs every 6 (six) hours as needed for wheezing or shortness of breath. 3 Inhaler 3  . lipase/protease/amylase (CREON) 36000 UNITS CPEP capsule Take 1 capsule (36,000 Units total) by mouth 3 (three) times daily before meals. 90 capsule 11  . pantoprazole (PROTONIX) 40 MG tablet Take 1 tablet (40 mg total) by mouth daily. 90 tablet 3  . potassium chloride (K-DUR) 10 MEQ tablet Take qd prn with Lasix 90 tablet 1  . roflumilast (DALIRESP) 500 MCG TABS tablet Take 1 tablet (500 mcg total) by mouth daily. 90 tablet 5  . Simethicone (GAS-X PO) Take by mouth.    . temazepam (RESTORIL) 15 MG capsule 1-2 po qhs prn 180 capsule 1  . Tiotropium Bromide Monohydrate (SPIRIVA RESPIMAT) 2.5 MCG/ACT AERS Inhale 2 puffs into the lungs daily. 4 g 11  . vortioxetine HBr (TRINTELLIX) 10 MG TABS tablet Take 1 tablet (10 mg total) by mouth daily. 30 tablet 5   No  facility-administered medications prior to visit.    ROS: Review of Systems  Constitutional: Negative for activity change, appetite change, chills, fatigue and unexpected weight change.  HENT: Negative for congestion, mouth sores and sinus pressure.   Eyes: Negative for visual disturbance.  Respiratory: Negative for cough and chest tightness.   Gastrointestinal: Negative for abdominal pain and nausea.  Genitourinary: Negative for difficulty urinating, frequency and vaginal pain.  Musculoskeletal: Positive for arthralgias, back pain and gait problem.  Skin: Negative for pallor and rash.  Neurological: Negative for dizziness, tremors, weakness, numbness and headaches.  Psychiatric/Behavioral: Negative for confusion and sleep disturbance. The patient is nervous/anxious.     Objective:  BP (!) 178/100 (BP Location: Right Arm, Patient Position: Sitting, Cuff Size: Normal)   Pulse 84   Temp 98.2 F (36.8 C) (Oral)   Ht 5\' 1"  (1.549 m)   Wt 148 lb (67.1 kg)   SpO2 96%   BMI 27.96 kg/m   BP Readings from Last 3 Encounters:  05/11/20 (!) 178/100  01/28/20 136/78  10/17/19 (!) 166/72    Wt Readings from Last 3 Encounters:  05/11/20 148 lb (67.1 kg)  01/28/20 147 lb (66.7 kg)  10/17/19 150 lb (68 kg)    Physical Exam Constitutional:      General: She is not in acute distress.    Appearance: She is  well-developed.  HENT:     Head: Normocephalic.     Right Ear: External ear normal.     Left Ear: External ear normal.     Nose: Nose normal.  Eyes:     General:        Right eye: No discharge.        Left eye: No discharge.     Conjunctiva/sclera: Conjunctivae normal.     Pupils: Pupils are equal, round, and reactive to light.  Neck:     Thyroid: No thyromegaly.     Vascular: No JVD.     Trachea: No tracheal deviation.  Cardiovascular:     Rate and Rhythm: Normal rate and regular rhythm.     Heart sounds: Normal heart sounds.  Pulmonary:     Effort: No respiratory  distress.     Breath sounds: No stridor. No wheezing.  Abdominal:     General: Bowel sounds are normal. There is no distension.     Palpations: Abdomen is soft. There is no mass.     Tenderness: There is no abdominal tenderness. There is no guarding or rebound.  Musculoskeletal:        General: No tenderness.     Cervical back: Normal range of motion and neck supple.  Lymphadenopathy:     Cervical: No cervical adenopathy.  Skin:    Findings: No erythema or rash.  Neurological:     Mental Status: She is oriented to person, place, and time.     Cranial Nerves: No cranial nerve deficit.     Motor: No abnormal muscle tone.     Coordination: Coordination normal.     Gait: Gait abnormal.     Deep Tendon Reflexes: Reflexes normal.  Psychiatric:        Behavior: Behavior normal.        Thought Content: Thought content normal.        Judgment: Judgment normal.     Lab Results  Component Value Date   WBC 7.5 01/28/2020   HGB 14.5 01/28/2020   HCT 44.4 01/28/2020   PLT 200.0 01/28/2020   GLUCOSE 90 01/28/2020   CHOL 234 (H) 10/29/2018   TRIG 167.0 (H) 10/29/2018   HDL 37.00 (L) 10/29/2018   LDLDIRECT 162.8 06/28/2011   LDLCALC 163 (H) 10/29/2018   ALT 17 01/28/2020   AST 24 01/28/2020   NA 139 01/28/2020   K 4.1 01/28/2020   CL 101 01/28/2020   CREATININE 0.68 01/28/2020   BUN 12 01/28/2020   CO2 33 (H) 01/28/2020   TSH 1.68 01/28/2020   HGBA1C 5.5 01/28/2020    VAS US CAROTID  Result Date: 08/31/2019 Carotid Arterial Duplex Study Indications:       Carotid artery disease and follow up. Patient complains of                    intermittent headaches for about 3 years. She also has                    numbness down her right arm which she was diagnosed with                    narrowing of C6-C7 intervertebral disc space in 04/2010.                    Patient denies any other cerebrovascular symptoms today. Risk Factors:      Hyperlipidemia, current smoker. Comparison Study:  Prior carotid duplex exam on 09/13/2018 showed velocities in                    right proximal ICA 145/42 cm/s and left proximal ICA 107/24                    cm/s. Performing Technologist: Salvadore Dom RVT, RDCS (AE), RDMS  Examination Guidelines: A complete evaluation includes B-mode imaging, spectral Doppler, color Doppler, and power Doppler as needed of all accessible portions of each vessel. Bilateral testing is considered an integral part of a complete examination. Limited examinations for reoccurring indications may be performed as noted.  Right Carotid Findings: +----------+--------+--------+--------+-------------------------+--------------+           PSV cm/sEDV cm/sStenosisPlaque Description       Comments       +----------+--------+--------+--------+-------------------------+--------------+ CCA Prox  154     21                                                      +----------+--------+--------+--------+-------------------------+--------------+ CCA Distal79      22                                                      +----------+--------+--------+--------+-------------------------+--------------+ ICA Prox  214     62      40-59%  heterogenous, irregular  high end range                                   and calcific                            +----------+--------+--------+--------+-------------------------+--------------+ ICA Mid   139     44                                                      +----------+--------+--------+--------+-------------------------+--------------+ ICA Distal80      27                                                      +----------+--------+--------+--------+-------------------------+--------------+ ECA       128     23                                                      +----------+--------+--------+--------+-------------------------+--------------+ +----------+--------+-------+----------------+-------------------+            PSV cm/sEDV cmsDescribe        Arm Pressure (mmHG) +----------+--------+-------+----------------+-------------------+ [id number]            Multiphasic, [id number]                 +----------+--------+-------+----------------+-------------------+ +---------+--------+--+--------+--+---------+  VertebralPSV cm/s60EDV cm/s19Antegrade +---------+--------+--+--------+--+---------+  Left Carotid Findings: +----------+-------+-------+--------+---------------------------------+--------+           PSV    EDV    StenosisPlaque Description               Comments           cm/s   cm/s                                                     +----------+-------+-------+--------+---------------------------------+--------+ CCA Prox  129    33                                                       +----------+-------+-------+--------+---------------------------------+--------+ CCA Distal101    23                                                       +----------+-------+-------+--------+---------------------------------+--------+ ICA Prox  91     26             heterogenous, irregular and                                               calcific                                  +----------+-------+-------+--------+---------------------------------+--------+ ICA Mid   105    32     1-39%                                             +----------+-------+-------+--------+---------------------------------+--------+ ICA Distal81     18                                                       +----------+-------+-------+--------+---------------------------------+--------+ ECA       87     12                                                       +----------+-------+-------+--------+---------------------------------+--------+ +----------+--------+--------+----------------+-------------------+           PSV cm/sEDV cm/sDescribe        Arm Pressure (mmHG)  +----------+--------+--------+----------------+-------------------+ [id number]             Multiphasic, [id number]                 +----------+--------+--------+----------------+-------------------+ +---------+--------+--+--------+--+---------+ VertebralPSV cm/s62EDV cm/s17Antegrade +---------+--------+--+--------+--+---------+  Summary: Right Carotid: The velocities in the  ICA have increased since prior exam and is                in the high end range of 40-59% stenosis. Left Carotid: Velocities in the left ICA are consistent with a 1-39% stenosis. Vertebrals:  Bilateral vertebral arteries demonstrate antegrade flow. Subclavians: Normal flow hemodynamics were seen in bilateral subclavian              arteries. *See table(s) above for measurements and observations. Suggest follow up study in 12 months. Electronically signed by Ena Dawley MD on 08/31/2019 at 5:00:12 PM.    Final     Assessment & Plan:    Walker Kehr, MD

## 2020-05-11 NOTE — Assessment & Plan Note (Addendum)
Chronic with apathy Lion's mane mushroom for memory

## 2020-05-11 NOTE — Patient Instructions (Signed)
Lion's mane mushroom for memory 

## 2020-05-11 NOTE — Assessment & Plan Note (Signed)
Wt Readings from Last 3 Encounters:  05/11/20 148 lb (67.1 kg)  01/28/20 147 lb (66.7 kg)  10/17/19 150 lb (68 kg)

## 2020-05-18 NOTE — Assessment & Plan Note (Signed)
On ASA Statin intolerance Car doppler in 11/21

## 2020-05-18 NOTE — Addendum Note (Signed)
Addended by: Cassandria Anger on: 05/18/2020 07:25 AM   Modules accepted: Orders

## 2020-05-27 ENCOUNTER — Telehealth: Payer: Self-pay | Admitting: Internal Medicine

## 2020-05-27 MED ORDER — CLONIDINE HCL 0.1 MG PO TABS
0.1000 mg | ORAL_TABLET | Freq: Three times a day (TID) | ORAL | 3 refills | Status: AC | PRN
Start: 2020-05-27 — End: ?

## 2020-05-27 NOTE — Telephone Encounter (Signed)
Clonidine Rx She is aware

## 2020-05-27 NOTE — Telephone Encounter (Signed)
New message:   Pt is calling and states she would like a call from Dr. Camila Li. She states her BP is 220/110. I offered pt and appt w/ another provider but pt refused. Please advise.

## 2020-06-01 ENCOUNTER — Ambulatory Visit (INDEPENDENT_AMBULATORY_CARE_PROVIDER_SITE_OTHER): Payer: Medicare Other | Admitting: Internal Medicine

## 2020-06-01 ENCOUNTER — Encounter: Payer: Self-pay | Admitting: Internal Medicine

## 2020-06-01 ENCOUNTER — Other Ambulatory Visit: Payer: Self-pay

## 2020-06-01 VITALS — BP 136/84 | HR 77 | Temp 97.9°F | Ht 61.0 in | Wt 146.2 lb

## 2020-06-01 DIAGNOSIS — F4323 Adjustment disorder with mixed anxiety and depressed mood: Secondary | ICD-10-CM | POA: Diagnosis not present

## 2020-06-01 DIAGNOSIS — F331 Major depressive disorder, recurrent, moderate: Secondary | ICD-10-CM | POA: Diagnosis not present

## 2020-06-01 DIAGNOSIS — Z23 Encounter for immunization: Secondary | ICD-10-CM | POA: Diagnosis not present

## 2020-06-01 MED ORDER — ALPRAZOLAM 1 MG PO TABS: 0.5000 mg | ORAL_TABLET | Freq: Three times a day (TID) | ORAL | 1 refills | Status: DC | PRN

## 2020-06-01 NOTE — Assessment & Plan Note (Signed)
Xanax prn  Potential benefits of a long term benzodiazepines  use as well as potential risks  and complications were explained to the patient and were aknowledged. D/c Trintellix

## 2020-06-01 NOTE — Assessment & Plan Note (Signed)
Intolerant of multiple antidepressants

## 2020-06-01 NOTE — Progress Notes (Signed)
Subjective:  Patient ID: Theresa Aguirre, female    DOB: [birth year]  Age: 74 y.o. MRN: [id number]  CC: Medication Management (BP meds)   HPI Theresa Aguirre presents for high BP f/u - SBP was 220 last week. The pt called 911.   Outpatient Medications Prior to Visit  Medication Sig Dispense Refill  . ALPRAZolam (XANAX) 1 MG tablet TAKE 1 TABLET BY MOUTH FOUR TIMES DAILY AS NEEDED FOR SLEEP OR ANXIETY 360 tablet 1  . aspirin (BAYER ASPIRIN) 325 MG tablet Take 0.5 tablets (162 mg total) by mouth daily. 100 tablet 3  . b complex vitamins tablet Take 1 tablet by mouth daily. 100 tablet 3  . clobetasol cream (TEMOVATE) 5.73 % Apply 1 application topically 2 (two) times daily. 60 g 3  . FEROSUL 325 (65 Fe) MG tablet TAKE 1 TABLET(325 MG) BY MOUTH DAILY 30 tablet 6  . hydrochlorothiazide (MICROZIDE) 12.5 MG capsule TAKE 1 CAPSULE(12.5 MG) BY MOUTH DAILY 90 capsule 3  . levalbuterol (XOPENEX HFA) 45 MCG/ACT inhaler Inhale 2 puffs into the lungs every 6 (six) hours as needed for wheezing or shortness of breath. 3 Inhaler 3  . lipase/protease/amylase (CREON) 36000 UNITS CPEP capsule Take 1 capsule (36,000 Units total) by mouth 3 (three) times daily before meals. 90 capsule 11  . roflumilast (DALIRESP) 500 MCG TABS tablet Take 1 tablet (500 mcg total) by mouth daily. 90 tablet 5  . Simethicone (GAS-X PO) Take by mouth.    . temazepam (RESTORIL) 15 MG capsule 1-2 po qhs prn 180 capsule 1  . Tiotropium Bromide Monohydrate (SPIRIVA RESPIMAT) 2.5 MCG/ACT AERS Inhale 2 puffs into the lungs daily. 4 g 11  . vortioxetine HBr (TRINTELLIX) 10 MG TABS tablet Take 1 tablet (10 mg total) by mouth daily. 30 tablet 5  . cloNIDine (CATAPRES) 0.1 MG tablet Take 1 tablet (0.1 mg total) by mouth 3 (three) times daily as needed (take if top blood pressure is over 180). (Patient not taking: Reported on 06/01/2020) 90 tablet 3  . furosemide (LASIX) 20 MG tablet Take 1 tablet (20 mg total) by mouth 2 (two) times daily as  needed for edema. (Patient not taking: Reported on 06/01/2020) 90 tablet 1  . pantoprazole (PROTONIX) 40 MG tablet Take 1 tablet (40 mg total) by mouth daily. (Patient not taking: Reported on 06/01/2020) 90 tablet 3  . potassium chloride (K-DUR) 10 MEQ tablet Take qd prn with Lasix (Patient not taking: Reported on 06/01/2020) 90 tablet 1   No facility-administered medications prior to visit.    ROS: Review of Systems  Constitutional: Negative for activity change, appetite change, chills, fatigue and unexpected weight change.  HENT: Negative for congestion, mouth sores and sinus pressure.   Eyes: Negative for visual disturbance.  Respiratory: Negative for cough and chest tightness.   Gastrointestinal: Negative for abdominal pain and nausea.  Genitourinary: Negative for difficulty urinating, frequency and vaginal pain.  Musculoskeletal: Negative for back pain and gait problem.  Skin: Negative for pallor and rash.  Neurological: Negative for dizziness, tremors, weakness, numbness and headaches.  Psychiatric/Behavioral: Negative for confusion and sleep disturbance.    Objective:  BP 136/84 (BP Location: Left Arm, Patient Position: Sitting, Cuff Size: Normal)   Pulse 77   Temp 97.9 F (36.6 C) (Oral)   Ht 5\' 1"  (1.549 m)   Wt 146 lb 3.2 oz (66.3 kg)   SpO2 98%   BMI 27.62 kg/m   BP Readings from Last 3 Encounters:  06/01/20 136/84  05/11/20 Marland Kitchen)  178/100  01/28/20 136/78    Wt Readings from Last 3 Encounters:  06/01/20 146 lb 3.2 oz (66.3 kg)  05/11/20 148 lb (67.1 kg)  01/28/20 147 lb (66.7 kg)    Physical Exam Constitutional:      General: She is not in acute distress.    Appearance: She is well-developed.  HENT:     Head: Normocephalic.     Right Ear: External ear normal.     Left Ear: External ear normal.     Nose: Nose normal.  Eyes:     General:        Right eye: No discharge.        Left eye: No discharge.     Conjunctiva/sclera: Conjunctivae normal.     Pupils:  Pupils are equal, round, and reactive to light.  Neck:     Thyroid: No thyromegaly.     Vascular: No JVD.     Trachea: No tracheal deviation.  Cardiovascular:     Rate and Rhythm: Normal rate and regular rhythm.     Heart sounds: Normal heart sounds.  Pulmonary:     Effort: No respiratory distress.     Breath sounds: No stridor. No wheezing.  Abdominal:     General: Bowel sounds are normal. There is no distension.     Palpations: Abdomen is soft. There is no mass.     Tenderness: There is no abdominal tenderness. There is no guarding or rebound.  Musculoskeletal:        General: No tenderness.     Cervical back: Normal range of motion and neck supple.  Lymphadenopathy:     Cervical: No cervical adenopathy.  Skin:    Findings: No erythema or rash.  Neurological:     Cranial Nerves: No cranial nerve deficit.     Motor: No abnormal muscle tone.     Coordination: Coordination normal.     Deep Tendon Reflexes: Reflexes normal.  Psychiatric:        Behavior: Behavior normal.        Thought Content: Thought content normal.        Judgment: Judgment normal.     Lab Results  Component Value Date   WBC 7.5 01/28/2020   HGB 14.5 01/28/2020   HCT 44.4 01/28/2020   PLT 200.0 01/28/2020   GLUCOSE 90 01/28/2020   CHOL 234 (H) 10/29/2018   TRIG 167.0 (H) 10/29/2018   HDL 37.00 (L) 10/29/2018   LDLDIRECT 162.8 06/28/2011   LDLCALC 163 (H) 10/29/2018   ALT 17 01/28/2020   AST 24 01/28/2020   NA 139 01/28/2020   K 4.1 01/28/2020   CL 101 01/28/2020   CREATININE 0.68 01/28/2020   BUN 12 01/28/2020   CO2 33 (H) 01/28/2020   TSH 1.68 01/28/2020   HGBA1C 5.5 01/28/2020    VAS US CAROTID  Result Date: 08/31/2019 Carotid Arterial Duplex Study Indications:       Carotid artery disease and follow up. Patient complains of                    intermittent headaches for about 3 years. She also has                    numbness down her right arm which she was diagnosed with                     narrowing of C6-C7 intervertebral disc space in 04/2010.  Patient denies any other cerebrovascular symptoms today. Risk Factors:      Hyperlipidemia, current smoker. Comparison Study:  Prior carotid duplex exam on 09/13/2018 showed velocities in                    right proximal ICA 145/42 cm/s and left proximal ICA 107/24                    cm/s. Performing Technologist: Salvadore Dom RVT, RDCS (AE), RDMS  Examination Guidelines: A complete evaluation includes B-mode imaging, spectral Doppler, color Doppler, and power Doppler as needed of all accessible portions of each vessel. Bilateral testing is considered an integral part of a complete examination. Limited examinations for reoccurring indications may be performed as noted.  Right Carotid Findings: +----------+--------+--------+--------+-------------------------+--------------+           PSV cm/sEDV cm/sStenosisPlaque Description       Comments       +----------+--------+--------+--------+-------------------------+--------------+ CCA Prox  154     21                                                      +----------+--------+--------+--------+-------------------------+--------------+ CCA Distal79      22                                                      +----------+--------+--------+--------+-------------------------+--------------+ ICA Prox  214     62      40-59%  heterogenous, irregular  high end range                                   and calcific                            +----------+--------+--------+--------+-------------------------+--------------+ ICA Mid   139     44                                                      +----------+--------+--------+--------+-------------------------+--------------+ ICA Distal80      27                                                      +----------+--------+--------+--------+-------------------------+--------------+ ECA       128     23                                                       +----------+--------+--------+--------+-------------------------+--------------+ +----------+--------+-------+----------------+-------------------+           PSV cm/sEDV cmsDescribe        Arm Pressure (mmHG) +----------+--------+-------+----------------+-------------------+ [id number]  Multiphasic, [id number]                 +----------+--------+-------+----------------+-------------------+ +---------+--------+--+--------+--+---------+ VertebralPSV cm/s60EDV cm/s19Antegrade +---------+--------+--+--------+--+---------+  Left Carotid Findings: +----------+-------+-------+--------+---------------------------------+--------+           PSV    EDV    StenosisPlaque Description               Comments           cm/s   cm/s                                                     +----------+-------+-------+--------+---------------------------------+--------+ CCA Prox  129    33                                                       +----------+-------+-------+--------+---------------------------------+--------+ CCA Distal101    23                                                       +----------+-------+-------+--------+---------------------------------+--------+ ICA Prox  91     26             heterogenous, irregular and                                               calcific                                  +----------+-------+-------+--------+---------------------------------+--------+ ICA Mid   105    32     1-39%                                             +----------+-------+-------+--------+---------------------------------+--------+ ICA Distal81     18                                                       +----------+-------+-------+--------+---------------------------------+--------+ ECA       87     12                                                        +----------+-------+-------+--------+---------------------------------+--------+ +----------+--------+--------+----------------+-------------------+           PSV cm/sEDV cm/sDescribe        Arm Pressure (mmHG) +----------+--------+--------+----------------+-------------------+ [id number]             Multiphasic, [id number]                 +----------+--------+--------+----------------+-------------------+ +---------+--------+--+--------+--+---------+  VertebralPSV cm/s62EDV cm/s17Antegrade +---------+--------+--+--------+--+---------+  Summary: Right Carotid: The velocities in the ICA have increased since prior exam and is                in the high end range of 40-59% stenosis. Left Carotid: Velocities in the left ICA are consistent with a 1-39% stenosis. Vertebrals:  Bilateral vertebral arteries demonstrate antegrade flow. Subclavians: Normal flow hemodynamics were seen in bilateral subclavian              arteries. *See table(s) above for measurements and observations. Suggest follow up study in 12 months. Electronically signed by Ena Dawley MD on 08/31/2019 at 5:00:12 PM.    Final     Assessment & Plan:    Walker Kehr, MD

## 2020-06-25 ENCOUNTER — Other Ambulatory Visit: Payer: Self-pay | Admitting: Internal Medicine

## 2020-08-11 ENCOUNTER — Ambulatory Visit (INDEPENDENT_AMBULATORY_CARE_PROVIDER_SITE_OTHER): Payer: Medicare Other | Admitting: Internal Medicine

## 2020-08-11 ENCOUNTER — Encounter: Payer: Self-pay | Admitting: Internal Medicine

## 2020-08-11 ENCOUNTER — Other Ambulatory Visit: Payer: Self-pay

## 2020-08-11 VITALS — BP 140/78 | HR 85 | Temp 98.2°F | Wt 151.8 lb

## 2020-08-11 DIAGNOSIS — Z23 Encounter for immunization: Secondary | ICD-10-CM | POA: Diagnosis not present

## 2020-08-11 DIAGNOSIS — T466X5A Adverse effect of antihyperlipidemic and antiarteriosclerotic drugs, initial encounter: Secondary | ICD-10-CM

## 2020-08-11 DIAGNOSIS — E785 Hyperlipidemia, unspecified: Secondary | ICD-10-CM | POA: Diagnosis not present

## 2020-08-11 DIAGNOSIS — R413 Other amnesia: Secondary | ICD-10-CM | POA: Diagnosis not present

## 2020-08-11 DIAGNOSIS — G720 Drug-induced myopathy: Secondary | ICD-10-CM

## 2020-08-11 DIAGNOSIS — R21 Rash and other nonspecific skin eruption: Secondary | ICD-10-CM

## 2020-08-11 LAB — COMPREHENSIVE METABOLIC PANEL
ALT: 21 U/L (ref 0–35)
AST: 25 U/L (ref 0–37)
Albumin: 4.4 g/dL (ref 3.5–5.2)
Alkaline Phosphatase: 60 U/L (ref 39–117)
BUN: 12 mg/dL (ref 6–23)
CO2: 32 mEq/L (ref 19–32)
Calcium: 9.6 mg/dL (ref 8.4–10.5)
Chloride: 102 mEq/L (ref 96–112)
Creatinine, Ser: 0.71 mg/dL (ref 0.40–1.20)
GFR: 83.43 mL/min (ref >60.00)
Glucose, Bld: 94 mg/dL (ref 70–99)
Potassium: 4.1 mEq/L (ref 3.5–5.1)
Sodium: 141 mEq/L (ref 135–145)
Total Bilirubin: 0.6 mg/dL (ref 0.2–1.2)
Total Protein: 7.4 g/dL (ref 6.0–8.3)

## 2020-08-11 LAB — CBC WITH DIFFERENTIAL/PLATELET
Basophils Absolute: 0.1 10*3/uL (ref 0.0–0.1)
Basophils Relative: 1 % (ref 0.0–3.0)
Eosinophils Absolute: 0.7 10*3/uL (ref 0.0–0.7)
Eosinophils Relative: 7.8 % — ABNORMAL HIGH (ref 0.0–5.0)
HCT: 43.9 % (ref 36.0–46.0)
Hemoglobin: 14.5 g/dL (ref 12.0–15.0)
Lymphocytes Relative: 34.3 % (ref 12.0–46.0)
Lymphs Abs: 3 10*3/uL (ref 0.7–4.0)
MCHC: 33.1 g/dL (ref 30.0–36.0)
MCV: 84.6 fl (ref 78.0–100.0)
Monocytes Absolute: 0.7 10*3/uL (ref 0.1–1.0)
Monocytes Relative: 7.6 % (ref 3.0–12.0)
Neutro Abs: 4.3 10*3/uL (ref 1.4–7.7)
Neutrophils Relative %: 49.3 % (ref 43.0–77.0)
Platelets: 206 10*3/uL (ref 150.0–400.0)
RBC: 5.19 Mil/uL — ABNORMAL HIGH (ref 3.87–5.11)
RDW: 13.7 % (ref 11.5–15.5)
WBC: 8.7 10*3/uL (ref 4.0–10.5)

## 2020-08-11 MED ORDER — CLOBETASOL PROPIONATE 0.05 % EX CREA: 1.0000 "application " | TOPICAL_CREAM | Freq: Two times a day (BID) | CUTANEOUS | 3 refills | Status: AC

## 2020-08-11 NOTE — Assessment & Plan Note (Signed)
Declined other options

## 2020-08-11 NOTE — Addendum Note (Signed)
Addended by: Trenda Moots on: 57/12/2200 03:12 PM   Modules accepted: Orders

## 2020-08-11 NOTE — Assessment & Plan Note (Signed)
Statin intolerant Declined other options

## 2020-08-11 NOTE — Assessment & Plan Note (Addendum)
try [place] Mushroom extract or capsules for memory B complex

## 2020-08-11 NOTE — Progress Notes (Signed)
Subjective:  Patient ID: Theresa Aguirre, female    DOB: [birth year]  Age: 74 y.o. MRN: [id number]  CC: Follow-up (3 month F/U- flu shot)   HPI Verdean Murin presents for anxiety, insomnia, GERD C/o abd gas, CPs - nonexertional C/o depression, short term memory issues, rash  Outpatient Medications Prior to Visit  Medication Sig Dispense Refill  . ALPRAZolam (XANAX) 1 MG tablet Take 0.5-1 tablets (0.5-1 mg total) by mouth 3 (three) times daily as needed for anxiety. 270 tablet 1  . aspirin (BAYER ASPIRIN) 325 MG tablet Take 0.5 tablets (162 mg total) by mouth daily. 100 tablet 3  . b complex vitamins tablet Take 1 tablet by mouth daily. 100 tablet 3  . clobetasol cream (TEMOVATE) 7.00 % Apply 1 application topically 2 (two) times daily. 60 g 3  . cloNIDine (CATAPRES) 0.1 MG tablet Take 1 tablet (0.1 mg total) by mouth 3 (three) times daily as needed (take if top blood pressure is over 180). 90 tablet 3  . FEROSUL 325 (65 Fe) MG tablet TAKE 1 TABLET(325 MG) BY MOUTH DAILY 30 tablet 6  . furosemide (LASIX) 20 MG tablet Take 1 tablet (20 mg total) by mouth 2 (two) times daily as needed for edema. 90 tablet 1  . hydrochlorothiazide (MICROZIDE) 12.5 MG capsule TAKE 1 CAPSULE(12.5 MG) BY MOUTH DAILY 90 capsule 3  . levalbuterol (XOPENEX HFA) 45 MCG/ACT inhaler Inhale 2 puffs into the lungs every 6 (six) hours as needed for wheezing or shortness of breath. 3 Inhaler 3  . lipase/protease/amylase (CREON) 36000 UNITS CPEP capsule Take 1 capsule (36,000 Units total) by mouth 3 (three) times daily before meals. 90 capsule 11  . pantoprazole (PROTONIX) 40 MG tablet Take 1 tablet (40 mg total) by mouth daily. 90 tablet 3  . potassium chloride (K-DUR) 10 MEQ tablet Take qd prn with Lasix 90 tablet 1  . roflumilast (DALIRESP) 500 MCG TABS tablet Take 1 tablet (500 mcg total) by mouth daily. 90 tablet 5  . Simethicone (GAS-X PO) Take by mouth.    . temazepam (RESTORIL) 15 MG capsule TAKE 1 TO 2 CAPSULES BY  MOUTH EVERY NIGHT AT BEDTIME AS NEEDED 180 capsule 1  . Tiotropium Bromide Monohydrate (SPIRIVA RESPIMAT) 2.5 MCG/ACT AERS Inhale 2 puffs into the lungs daily. 4 g 11   No facility-administered medications prior to visit.    ROS: Review of Systems  Constitutional: Negative for activity change, appetite change, chills, fatigue and unexpected weight change.  HENT: Negative for congestion, mouth sores and sinus pressure.   Eyes: Negative for visual disturbance.  Respiratory: Negative for cough and chest tightness.   Gastrointestinal: Negative for abdominal pain and nausea.  Genitourinary: Negative for difficulty urinating, frequency and vaginal pain.  Musculoskeletal: Positive for arthralgias and back pain. Negative for gait problem.  Skin: Negative for pallor and rash.  Neurological: Negative for dizziness, tremors, weakness, numbness and headaches.  Psychiatric/Behavioral: Positive for decreased concentration and sleep disturbance. Negative for confusion and suicidal ideas. The patient is nervous/anxious.     Objective:  BP 140/78 (BP Location: Left Arm)   Pulse 85   Temp 98.2 F (36.8 C) (Oral)   Wt 151 lb 12.8 oz (68.9 kg)   SpO2 96%   BMI 28.68 kg/m   BP Readings from Last 3 Encounters:  08/11/20 140/78  06/01/20 136/84  05/11/20 (!) 178/100    Wt Readings from Last 3 Encounters:  08/11/20 151 lb 12.8 oz (68.9 kg)  06/01/20 146 lb 3.2 oz (  66.3 kg)  05/11/20 148 lb (67.1 kg)    Physical Exam Constitutional:      General: She is not in acute distress.    Appearance: She is well-developed.  HENT:     Head: Normocephalic.     Right Ear: External ear normal.     Left Ear: External ear normal.     Nose: Nose normal.  Eyes:     General:        Right eye: No discharge.        Left eye: No discharge.     Conjunctiva/sclera: Conjunctivae normal.     Pupils: Pupils are equal, round, and reactive to light.  Neck:     Thyroid: No thyromegaly.     Vascular: No JVD.      Trachea: No tracheal deviation.  Cardiovascular:     Rate and Rhythm: Normal rate and regular rhythm.     Heart sounds: Normal heart sounds.  Pulmonary:     Effort: No respiratory distress.     Breath sounds: No stridor. No wheezing.  Abdominal:     General: Bowel sounds are normal. There is no distension.     Palpations: Abdomen is soft. There is no mass.     Tenderness: There is no abdominal tenderness. There is no guarding or rebound.  Musculoskeletal:        General: Tenderness present.     Cervical back: Normal range of motion and neck supple.  Lymphadenopathy:     Cervical: No cervical adenopathy.  Skin:    Findings: No erythema or rash.  Neurological:     Cranial Nerves: No cranial nerve deficit.     Motor: No abnormal muscle tone.     Coordination: Coordination normal.     Deep Tendon Reflexes: Reflexes normal.  Psychiatric:        Behavior: Behavior normal.        Thought Content: Thought content normal.        Judgment: Judgment normal.   a/o/c  Lab Results  Component Value Date   WBC 7.5 01/28/2020   HGB 14.5 01/28/2020   HCT 44.4 01/28/2020   PLT 200.0 01/28/2020   GLUCOSE 90 01/28/2020   CHOL 234 (H) 10/29/2018   TRIG 167.0 (H) 10/29/2018   HDL 37.00 (L) 10/29/2018   LDLDIRECT 162.8 06/28/2011   LDLCALC 163 (H) 10/29/2018   ALT 17 01/28/2020   AST 24 01/28/2020   NA 139 01/28/2020   K 4.1 01/28/2020   CL 101 01/28/2020   CREATININE 0.68 01/28/2020   BUN 12 01/28/2020   CO2 33 (H) 01/28/2020   TSH 1.68 01/28/2020   HGBA1C 5.5 01/28/2020    VAS US CAROTID  Result Date: 08/31/2019 Carotid Arterial Duplex Study Indications:       Carotid artery disease and follow up. Patient complains of                    intermittent headaches for about 3 years. She also has                    numbness down her right arm which she was diagnosed with                    narrowing of C6-C7 intervertebral disc space in 04/2010.                    Patient denies any other  cerebrovascular symptoms today. Risk Factors:  Hyperlipidemia, current smoker. Comparison Study:  Prior carotid duplex exam on 09/13/2018 showed velocities in                    right proximal ICA 145/42 cm/s and left proximal ICA 107/24                    cm/s. Performing Technologist: Salvadore Dom RVT, RDCS (AE), RDMS  Examination Guidelines: A complete evaluation includes B-mode imaging, spectral Doppler, color Doppler, and power Doppler as needed of all accessible portions of each vessel. Bilateral testing is considered an integral part of a complete examination. Limited examinations for reoccurring indications may be performed as noted.  Right Carotid Findings: +----------+--------+--------+--------+-------------------------+--------------+           PSV cm/sEDV cm/sStenosisPlaque Description       Comments       +----------+--------+--------+--------+-------------------------+--------------+ CCA Prox  154     21                                                      +----------+--------+--------+--------+-------------------------+--------------+ CCA Distal79      22                                                      +----------+--------+--------+--------+-------------------------+--------------+ ICA Prox  214     62      40-59%  heterogenous, irregular  high end range                                   and calcific                            +----------+--------+--------+--------+-------------------------+--------------+ ICA Mid   139     44                                                      +----------+--------+--------+--------+-------------------------+--------------+ ICA Distal80      27                                                      +----------+--------+--------+--------+-------------------------+--------------+ ECA       128     23                                                       +----------+--------+--------+--------+-------------------------+--------------+ +----------+--------+-------+----------------+-------------------+           PSV cm/sEDV cmsDescribe        Arm Pressure (mmHG) +----------+--------+-------+----------------+-------------------+ [id number]            Multiphasic, [id number]                 +----------+--------+-------+----------------+-------------------+ +---------+--------+--+--------+--+---------+  VertebralPSV cm/s60EDV cm/s19Antegrade +---------+--------+--+--------+--+---------+  Left Carotid Findings: +----------+-------+-------+--------+---------------------------------+--------+           PSV    EDV    StenosisPlaque Description               Comments           cm/s   cm/s                                                     +----------+-------+-------+--------+---------------------------------+--------+ CCA Prox  129    33                                                       +----------+-------+-------+--------+---------------------------------+--------+ CCA Distal101    23                                                       +----------+-------+-------+--------+---------------------------------+--------+ ICA Prox  91     26             heterogenous, irregular and                                               calcific                                  +----------+-------+-------+--------+---------------------------------+--------+ ICA Mid   105    32     1-39%                                             +----------+-------+-------+--------+---------------------------------+--------+ ICA Distal81     18                                                       +----------+-------+-------+--------+---------------------------------+--------+ ECA       87     12                                                        +----------+-------+-------+--------+---------------------------------+--------+ +----------+--------+--------+----------------+-------------------+           PSV cm/sEDV cm/sDescribe        Arm Pressure (mmHG) +----------+--------+--------+----------------+-------------------+ [id number]             Multiphasic, [id number]                 +----------+--------+--------+----------------+-------------------+ +---------+--------+--+--------+--+---------+ VertebralPSV cm/s62EDV cm/s17Antegrade +---------+--------+--+--------+--+---------+  Summary: Right Carotid: The velocities in the  ICA have increased since prior exam and is                in the high end range of 40-59% stenosis. Left Carotid: Velocities in the left ICA are consistent with a 1-39% stenosis. Vertebrals:  Bilateral vertebral arteries demonstrate antegrade flow. Subclavians: Normal flow hemodynamics were seen in bilateral subclavian              arteries. *See table(s) above for measurements and observations. Suggest follow up study in 12 months. Electronically signed by Ena Dawley MD on 08/31/2019 at 5:00:12 PM.    Final     Assessment & Plan:   There are no diagnoses linked to this encounter.   No orders of the defined types were placed in this encounter.    Follow-up: No follow-ups on file.  Walker Kehr, MD

## 2020-08-11 NOTE — Assessment & Plan Note (Signed)
Steroid cream

## 2020-08-11 NOTE — Addendum Note (Signed)
Addended by: Earnstine Regal on: 08/11/2020 03:34 PM   Modules accepted: Orders

## 2020-08-12 ENCOUNTER — Ambulatory Visit: Payer: Medicare Other

## 2020-08-28 ENCOUNTER — Ambulatory Visit (HOSPITAL_COMMUNITY)
Admission: RE | Admit: 2020-08-28 | Discharge: 2020-08-28 | Disposition: A | Discharge status: Home / Self Care | Payer: Medicare Other | Source: Ambulatory Visit | Attending: Cardiology | Admitting: Cardiology

## 2020-08-28 ENCOUNTER — Other Ambulatory Visit: Payer: Self-pay

## 2020-08-28 DIAGNOSIS — I6523 Occlusion and stenosis of bilateral carotid arteries: Secondary | ICD-10-CM

## 2020-10-07 ENCOUNTER — Ambulatory Visit (INDEPENDENT_AMBULATORY_CARE_PROVIDER_SITE_OTHER): Payer: Medicare Other | Admitting: Internal Medicine

## 2020-10-07 ENCOUNTER — Encounter: Payer: Self-pay | Admitting: Internal Medicine

## 2020-10-07 ENCOUNTER — Other Ambulatory Visit: Payer: Self-pay

## 2020-10-07 DIAGNOSIS — R413 Other amnesia: Secondary | ICD-10-CM

## 2020-10-07 DIAGNOSIS — K2101 Gastro-esophageal reflux disease with esophagitis, with bleeding: Secondary | ICD-10-CM | POA: Diagnosis not present

## 2020-10-07 DIAGNOSIS — J00 Acute nasopharyngitis [common cold]: Secondary | ICD-10-CM

## 2020-10-07 DIAGNOSIS — R0789 Other chest pain: Secondary | ICD-10-CM

## 2020-10-07 DIAGNOSIS — R634 Abnormal weight loss: Secondary | ICD-10-CM | POA: Diagnosis not present

## 2020-10-07 DIAGNOSIS — J310 Chronic rhinitis: Secondary | ICD-10-CM | POA: Insufficient documentation

## 2020-10-07 DIAGNOSIS — R1084 Generalized abdominal pain: Secondary | ICD-10-CM | POA: Diagnosis not present

## 2020-10-07 DIAGNOSIS — I6523 Occlusion and stenosis of bilateral carotid arteries: Secondary | ICD-10-CM | POA: Diagnosis not present

## 2020-10-07 DIAGNOSIS — J449 Chronic obstructive pulmonary disease, unspecified: Secondary | ICD-10-CM

## 2020-10-07 MED ORDER — LORATADINE 10 MG PO TABS: 10.0000 mg | ORAL_TABLET | Freq: Every day | ORAL | 3 refills | Status: DC

## 2020-10-07 MED ORDER — DALIRESP 500 MCG PO TABS
500.0000 ug | ORAL_TABLET | Freq: Every day | ORAL | 3 refills | Status: DC
Start: 2020-10-07 — End: 2022-01-25

## 2020-10-07 MED ORDER — PSEUDOEPHEDRINE HCL ER 120 MG PO TB12: 120.0000 mg | ORAL_TABLET | Freq: Two times a day (BID) | ORAL | 1 refills | Status: AC | PRN

## 2020-10-07 MED ORDER — TEMAZEPAM 15 MG PO CAPS
ORAL_CAPSULE | ORAL | 1 refills | Status: DC
Start: 2020-10-07 — End: 2021-05-13

## 2020-10-07 MED ORDER — PANTOPRAZOLE SODIUM 40 MG PO TBEC
40.0000 mg | DELAYED_RELEASE_TABLET | Freq: Two times a day (BID) | ORAL | 3 refills | Status: DC
Start: 2020-10-07 — End: 2021-08-18

## 2020-10-07 NOTE — Assessment & Plan Note (Signed)
Wt Readings from Last 3 Encounters:  10/07/20 154 lb (69.9 kg)  08/11/20 151 lb 12.8 oz (68.9 kg)  06/01/20 146 lb 3.2 oz (66.3 kg)  Resolved - she stopped smoking

## 2020-10-07 NOTE — Progress Notes (Signed)
Subjective:  Patient ID: Theresa Aguirre, female    DOB: [birth year]  Age: 74 y.o. MRN: [id number]  CC: No chief complaint on file.   HPI Theresa Aguirre presents for LUQ pain after meals. Creon, protonix helps ... C/o nasal congestion. C/o GERD  Outpatient Medications Prior to Visit  Medication Sig Dispense Refill  . ALPRAZolam (XANAX) 1 MG tablet Take 0.5-1 tablets (0.5-1 mg total) by mouth 3 (three) times daily as needed for anxiety. 270 tablet 1  . aspirin (BAYER ASPIRIN) 325 MG tablet Take 0.5 tablets (162 mg total) by mouth daily. 100 tablet 3  . b complex vitamins tablet Take 1 tablet by mouth daily. 100 tablet 3  . clobetasol cream (TEMOVATE) [id number] % Apply 1 application topically 2 (two) times daily. 60 g 3  . cloNIDine (CATAPRES) 0.1 MG tablet Take 1 tablet (0.1 mg total) by mouth 3 (three) times daily as needed (take if top blood pressure is over 180). 90 tablet 3  . FEROSUL 325 (65 Fe) MG tablet TAKE 1 TABLET(325 MG) BY MOUTH DAILY 30 tablet 6  . furosemide (LASIX) 20 MG tablet Take 1 tablet (20 mg total) by mouth 2 (two) times daily as needed for edema. 90 tablet 1  . hydrochlorothiazide (MICROZIDE) 12.5 MG capsule TAKE 1 CAPSULE(12.5 MG) BY MOUTH DAILY 90 capsule 3  . levalbuterol (XOPENEX HFA) 45 MCG/ACT inhaler Inhale 2 puffs into the lungs every 6 (six) hours as needed for wheezing or shortness of breath. 3 Inhaler 3  . lipase/protease/amylase (CREON) 36000 UNITS CPEP capsule Take 1 capsule (36,000 Units total) by mouth 3 (three) times daily before meals. 90 capsule 11  . pantoprazole (PROTONIX) 40 MG tablet Take 1 tablet (40 mg total) by mouth daily. 90 tablet 3  . potassium chloride (K-DUR) 10 MEQ tablet Take qd prn with Lasix 90 tablet 1  . roflumilast (DALIRESP) 500 MCG TABS tablet Take 1 tablet (500 mcg total) by mouth daily. 90 tablet 5  . Simethicone (GAS-X PO) Take by mouth.    . temazepam (RESTORIL) 15 MG capsule TAKE 1 TO 2 CAPSULES BY MOUTH EVERY NIGHT AT BEDTIME AS  NEEDED 180 capsule 1  . Tiotropium Bromide Monohydrate (SPIRIVA RESPIMAT) 2.5 MCG/ACT AERS Inhale 2 puffs into the lungs daily. 4 g 11   No facility-administered medications prior to visit.    ROS: Review of Systems  Constitutional: Positive for fatigue. Negative for activity change, appetite change, chills and unexpected weight change.  HENT: Positive for congestion. Negative for mouth sores and sinus pressure.   Eyes: Negative for visual disturbance.  Respiratory: Negative for cough and chest tightness.   Gastrointestinal: Negative for abdominal pain and nausea.  Genitourinary: Negative for difficulty urinating, frequency and vaginal pain.  Musculoskeletal: Negative for back pain and gait problem.  Skin: Negative for pallor and rash.  Neurological: Negative for dizziness, tremors, weakness, numbness and headaches.  Psychiatric/Behavioral: Negative for confusion, sleep disturbance and suicidal ideas. The patient is nervous/anxious.     Objective:  BP (!) 144/80   Pulse 78   Temp 98.3 F (36.8 C) (Oral)   Ht 5\' 1"  (1.549 m)   Wt 154 lb (69.9 kg)   SpO2 97%   BMI 29.10 kg/m   BP Readings from Last 3 Encounters:  10/07/20 (!) 144/80  08/11/20 140/78  06/01/20 136/84    Wt Readings from Last 3 Encounters:  10/07/20 154 lb (69.9 kg)  08/11/20 151 lb 12.8 oz (68.9 kg)  06/01/20 146 lb 3.2 oz (  66.3 kg)    Physical Exam Constitutional:      General: She is not in acute distress.    Appearance: Normal appearance. She is well-developed.  HENT:     Head: Normocephalic.     Right Ear: External ear normal.     Left Ear: External ear normal.     Nose: Nose normal.     Mouth/Throat:     Mouth: Oropharynx is clear and moist.  Eyes:     General:        Right eye: No discharge.        Left eye: No discharge.     Conjunctiva/sclera: Conjunctivae normal.     Pupils: Pupils are equal, round, and reactive to light.  Neck:     Thyroid: No thyromegaly.     Vascular: No JVD.      Trachea: No tracheal deviation.  Cardiovascular:     Rate and Rhythm: Normal rate and regular rhythm.     Heart sounds: Normal heart sounds.  Pulmonary:     Effort: No respiratory distress.     Breath sounds: No stridor. No wheezing.  Abdominal:     General: Bowel sounds are normal. There is no distension.     Palpations: Abdomen is soft. There is no mass.     Tenderness: There is no abdominal tenderness. There is no guarding or rebound.  Musculoskeletal:        General: No tenderness or edema.     Cervical back: Normal range of motion and neck supple.  Lymphadenopathy:     Cervical: No cervical adenopathy.  Skin:    Findings: No erythema or rash.  Neurological:     Mental Status: She is oriented to person, place, and time.     Cranial Nerves: No cranial nerve deficit.     Motor: No abnormal muscle tone.     Coordination: Coordination normal.     Deep Tendon Reflexes: Reflexes normal.  Psychiatric:        Mood and Affect: Mood and affect normal.        Behavior: Behavior normal.        Thought Content: Thought content normal.        Judgment: Judgment normal.   swollen nasal mucosa  Lab Results  Component Value Date   WBC 8.7 08/11/2020   HGB 14.5 08/11/2020   HCT 43.9 08/11/2020   PLT 206.0 08/11/2020   GLUCOSE 94 08/11/2020   CHOL 234 (H) 10/29/2018   TRIG 167.0 (H) 10/29/2018   HDL 37.00 (L) 10/29/2018   LDLDIRECT 162.8 06/28/2011   LDLCALC 163 (H) 10/29/2018   ALT 21 08/11/2020   AST 25 08/11/2020   NA 141 08/11/2020   K 4.1 08/11/2020   CL 102 08/11/2020   CREATININE 0.71 08/11/2020   BUN 12 08/11/2020   CO2 32 08/11/2020   TSH 1.68 01/28/2020   HGBA1C 5.5 01/28/2020    VAS US CAROTID  Result Date: 08/29/2020 Carotid Arterial Duplex Study Indications:       Carotid artery disease follow-up. No new cerebrovascular                    symptoms Risk Factors:      Hyperlipidemia, current smoker. Comparison Study:  Previous carotid duplex performed 08/31/19  showed RICA                    velocities of 214/62 cm/sec and LICA velocities of 105/32  cm/sec. Performing Technologist: Mariane Masters RVT  Examination Guidelines: A complete evaluation includes B-mode imaging, spectral Doppler, color Doppler, and power Doppler as needed of all accessible portions of each vessel. Bilateral testing is considered an integral part of a complete examination. Limited examinations for reoccurring indications may be performed as noted.  Right Carotid Findings: +----------+--------+--------+--------+------------------+--------+           PSV cm/sEDV cm/sStenosisPlaque DescriptionComments +----------+--------+--------+--------+------------------+--------+ CCA Prox  100     24                                         +----------+--------+--------+--------+------------------+--------+ CCA Distal63      19                                         +----------+--------+--------+--------+------------------+--------+ ICA Prox  163     50      40-59%  heterogenous               +----------+--------+--------+--------+------------------+--------+ ICA Mid   88      29                                         +----------+--------+--------+--------+------------------+--------+ ICA Distal57      19                                         +----------+--------+--------+--------+------------------+--------+ ECA       133     30                                         +----------+--------+--------+--------+------------------+--------+ +----------+--------+-------+----------------+-------------------+           PSV cm/sEDV cmsDescribe        Arm Pressure (mmHG) +----------+--------+-------+----------------+-------------------+ [id number]            Multiphasic, [id number]                 +----------+--------+-------+----------------+-------------------+ +---------+--------+--+--------+--+---------+ VertebralPSV cm/s43EDV  cm/s10Antegrade +---------+--------+--+--------+--+---------+  Left Carotid Findings: +----------+--------+--------+--------+------------------+--------+           PSV cm/sEDV cm/sStenosisPlaque DescriptionComments +----------+--------+--------+--------+------------------+--------+ CCA Prox  106     24                                         +----------+--------+--------+--------+------------------+--------+ CCA Distal79      19                                         +----------+--------+--------+--------+------------------+--------+ ICA Prox  56      16              heterogenous               +----------+--------+--------+--------+------------------+--------+ ICA Mid   99      27      1-39%                              +----------+--------+--------+--------+------------------+--------+  ICA Distal88      29                                         +----------+--------+--------+--------+------------------+--------+ ECA       81      11                                         +----------+--------+--------+--------+------------------+--------+ +----------+--------+--------+----------------+-------------------+           PSV cm/sEDV cm/sDescribe        Arm Pressure (mmHG) +----------+--------+--------+----------------+-------------------+ [id number]             Multiphasic, [id number]                 +----------+--------+--------+----------------+-------------------+ +---------+--------+--+--------+-+---------+ VertebralPSV cm/s43EDV cm/s9Antegrade +---------+--------+--+--------+-+---------+   Summary: Right Carotid: Velocities in the right ICA are consistent with a 40-59%                stenosis. Left Carotid: Velocities in the left ICA are consistent with a 1-39% stenosis. Vertebrals:  Bilateral vertebral arteries demonstrate antegrade flow. Subclavians: Normal flow hemodynamics were seen in bilateral subclavian              arteries. *See table(s)  above for measurements and observations. Suggest follow up study in 12 months. Electronically signed by Larae Grooms MD on 08/29/2020 at 6:03:25 PM.    Final     Assessment & Plan:   Follow-up: No follow-ups on file.  Walker Kehr, MD

## 2020-10-07 NOTE — Assessment & Plan Note (Signed)
Worse Increase Protonix to bid Creon

## 2020-10-07 NOTE — Assessment & Plan Note (Signed)
On ASA 

## 2020-10-07 NOTE — Assessment & Plan Note (Deleted)
Try Lion's mane 

## 2020-10-07 NOTE — Patient Instructions (Addendum)
You can try Lion's Mane Mushroom capsules for memory, focus ([employer] [email or web address] [email or web address])   

## 2020-10-07 NOTE — Assessment & Plan Note (Signed)
Try Lion's mane 

## 2020-10-07 NOTE — Assessment & Plan Note (Signed)
Treat GERD 

## 2020-10-07 NOTE — Assessment & Plan Note (Signed)
Sudafed prn - rare Claritin

## 2020-10-07 NOTE — Assessment & Plan Note (Signed)
Pt stopped smoking!

## 2020-12-02 ENCOUNTER — Other Ambulatory Visit: Payer: Self-pay | Admitting: Internal Medicine

## 2020-12-02 MED ORDER — HYDROCHLOROTHIAZIDE 12.5 MG PO CAPS
ORAL_CAPSULE | ORAL | 0 refills | Status: DC
Start: 2020-12-02 — End: 2021-08-11

## 2020-12-02 NOTE — Addendum Note (Signed)
Addended by: Earnstine Regal on: 12/02/2020 03:47 PM   Modules accepted: Orders

## 2021-01-28 DIAGNOSIS — H52203 Unspecified astigmatism, bilateral: Secondary | ICD-10-CM | POA: Diagnosis not present

## 2021-01-28 DIAGNOSIS — H5203 Hypermetropia, bilateral: Secondary | ICD-10-CM | POA: Diagnosis not present

## 2021-01-28 DIAGNOSIS — H524 Presbyopia: Secondary | ICD-10-CM | POA: Diagnosis not present

## 2021-01-28 DIAGNOSIS — H2513 Age-related nuclear cataract, bilateral: Secondary | ICD-10-CM | POA: Diagnosis not present

## 2021-03-31 ENCOUNTER — Other Ambulatory Visit: Payer: Self-pay | Admitting: Internal Medicine

## 2021-04-03 ENCOUNTER — Other Ambulatory Visit: Payer: Self-pay | Admitting: Internal Medicine

## 2021-05-12 ENCOUNTER — Other Ambulatory Visit: Payer: Self-pay | Admitting: Internal Medicine

## 2021-05-14 ENCOUNTER — Encounter: Payer: Self-pay | Admitting: Internal Medicine

## 2021-05-14 ENCOUNTER — Ambulatory Visit (INDEPENDENT_AMBULATORY_CARE_PROVIDER_SITE_OTHER): Payer: Medicare HMO | Admitting: Internal Medicine

## 2021-05-14 ENCOUNTER — Other Ambulatory Visit: Payer: Self-pay

## 2021-05-14 VITALS — BP 140/86 | HR 81 | Temp 98.2°F | Ht 61.0 in | Wt 146.6 lb

## 2021-05-14 DIAGNOSIS — R600 Localized edema: Secondary | ICD-10-CM

## 2021-05-14 DIAGNOSIS — R252 Cramp and spasm: Secondary | ICD-10-CM | POA: Diagnosis not present

## 2021-05-14 DIAGNOSIS — E785 Hyperlipidemia, unspecified: Secondary | ICD-10-CM | POA: Diagnosis not present

## 2021-05-14 DIAGNOSIS — J449 Chronic obstructive pulmonary disease, unspecified: Secondary | ICD-10-CM | POA: Diagnosis not present

## 2021-05-14 DIAGNOSIS — R69 Illness, unspecified: Secondary | ICD-10-CM | POA: Diagnosis not present

## 2021-05-14 DIAGNOSIS — F331 Major depressive disorder, recurrent, moderate: Secondary | ICD-10-CM

## 2021-05-14 DIAGNOSIS — D509 Iron deficiency anemia, unspecified: Secondary | ICD-10-CM

## 2021-05-14 DIAGNOSIS — R413 Other amnesia: Secondary | ICD-10-CM | POA: Diagnosis not present

## 2021-05-14 LAB — COMPREHENSIVE METABOLIC PANEL
ALT: 16 U/L (ref 0–35)
AST: 25 U/L (ref 0–37)
Albumin: 4.5 g/dL (ref 3.5–5.2)
Alkaline Phosphatase: 67 U/L (ref 39–117)
BUN: 11 mg/dL (ref 6–23)
CO2: 32 mEq/L (ref 19–32)
Calcium: 9.7 mg/dL (ref 8.4–10.5)
Chloride: 100 mEq/L (ref 96–112)
Creatinine, Ser: 0.69 mg/dL (ref 0.40–1.20)
GFR: 84.7 mL/min (ref >60.00)
Glucose, Bld: 92 mg/dL (ref 70–99)
Potassium: 3.8 mEq/L (ref 3.5–5.1)
Sodium: 138 mEq/L (ref 135–145)
Total Bilirubin: 0.6 mg/dL (ref 0.2–1.2)
Total Protein: 7.5 g/dL (ref 6.0–8.3)

## 2021-05-14 LAB — CBC WITH DIFFERENTIAL/PLATELET
Basophils Absolute: 0.1 10*3/uL (ref 0.0–0.1)
Basophils Relative: 1.2 % (ref 0.0–3.0)
Eosinophils Absolute: 0.6 10*3/uL (ref 0.0–0.7)
Eosinophils Relative: 6.9 % — ABNORMAL HIGH (ref 0.0–5.0)
HCT: 35.7 % — ABNORMAL LOW (ref 36.0–46.0)
Hemoglobin: 11.3 g/dL — ABNORMAL LOW (ref 12.0–15.0)
Lymphocytes Relative: 34.6 % (ref 12.0–46.0)
Lymphs Abs: 3.1 10*3/uL (ref 0.7–4.0)
MCHC: 31.6 g/dL (ref 30.0–36.0)
MCV: 72.5 fl — ABNORMAL LOW (ref 78.0–100.0)
Monocytes Absolute: 0.8 10*3/uL (ref 0.1–1.0)
Monocytes Relative: 8.9 % (ref 3.0–12.0)
Neutro Abs: 4.3 10*3/uL (ref 1.4–7.7)
Neutrophils Relative %: 48.4 % (ref 43.0–77.0)
Platelets: 233 10*3/uL (ref 150.0–400.0)
RBC: 4.93 Mil/uL (ref 3.87–5.11)
RDW: 16.7 % — ABNORMAL HIGH (ref 11.5–15.5)
WBC: 8.9 10*3/uL (ref 4.0–10.5)

## 2021-05-14 LAB — LIPID PANEL
Cholesterol: 206 mg/dL — ABNORMAL HIGH (ref 0–200)
HDL: 46.5 mg/dL (ref >39.00)
LDL Cholesterol: 123 mg/dL — ABNORMAL HIGH (ref 0–99)
NonHDL: 159.09
Total CHOL/HDL Ratio: 4
Triglycerides: 180 mg/dL — ABNORMAL HIGH (ref 0.0–149.0)
VLDL: 36 mg/dL (ref 0.0–40.0)

## 2021-05-14 LAB — URINALYSIS
Bilirubin Urine: NEGATIVE
Hgb urine dipstick: NEGATIVE
Ketones, ur: NEGATIVE
Leukocytes,Ua: NEGATIVE
Nitrite: NEGATIVE
Specific Gravity, Urine: <=1.005 — AB (ref 1.000–1.030)
Total Protein, Urine: NEGATIVE
Urine Glucose: NEGATIVE
Urobilinogen, UA: 0.2 (ref 0.0–1.0)
pH: 5.5 (ref 5.0–8.0)

## 2021-05-14 LAB — TSH: TSH: 0.88 u[IU]/mL (ref 0.35–5.50)

## 2021-05-14 MED ORDER — TEMAZEPAM 15 MG PO CAPS
ORAL_CAPSULE | ORAL | 5 refills | Status: DC
Start: 2021-05-14 — End: 2022-01-13

## 2021-05-14 NOTE — Patient Instructions (Addendum)
You can try Lion's Mane Mushroom capsules for memory problems, decreased focus, mental fog, neuropathy ([email or web address])    Magnesium spray for cramps Tylenol pm

## 2021-05-14 NOTE — Assessment & Plan Note (Addendum)
We will obtain CBC

## 2021-05-14 NOTE — Progress Notes (Signed)
Subjective:  Patient ID: Theresa Aguirre, female    DOB: Oct 08, 1946  Age: 75 y.o. MRN: [id number]  CC: Follow-up (6 month f/u)   HPI Theresa Aguirre presents for fatigue, anemia, COPD, anxiety, insomnia f/u  Outpatient Medications Prior to Visit  Medication Sig Dispense Refill   ALPRAZolam (XANAX) 1 MG tablet Take 0.5-1 tablets (0.5-1 mg total) by mouth 3 (three) times daily as needed for anxiety. 270 tablet 1   aspirin (BAYER ASPIRIN) 325 MG tablet Take 0.5 tablets (162 mg total) by mouth daily. 100 tablet 3   b complex vitamins tablet Take 1 tablet by mouth daily. 100 tablet 3   clobetasol cream (TEMOVATE) [id number] % Apply 1 application topically 2 (two) times daily. 60 g 3   cloNIDine (CATAPRES) 0.1 MG tablet Take 1 tablet (0.1 mg total) by mouth 3 (three) times daily as needed (take if top blood pressure is over 180). 90 tablet 3   FEROSUL 325 (65 Fe) MG tablet TAKE 1 TABLET(325 MG) BY MOUTH DAILY 30 tablet 6   furosemide (LASIX) 20 MG tablet Take 1 tablet (20 mg total) by mouth 2 (two) times daily as needed for edema. 90 tablet 1   hydrochlorothiazide (MICROZIDE) 12.5 MG capsule TAKE 1 CAPSULE(12.5 MG) BY MOUTH DAILY 90 capsule 0   levalbuterol (XOPENEX HFA) 45 MCG/ACT inhaler INHALE 2 PUFFS INTO THE LUNGS EVERY 6 HOURS AS NEEDED FOR WHEEZING OR SHORTNESS OF BREATH 45 g 3   lipase/protease/amylase (CREON) 36000 UNITS CPEP capsule Take 1 capsule (36,000 Units total) by mouth 3 (three) times daily before meals. 90 capsule 11   loratadine (CLARITIN) 10 MG tablet Take 1 tablet (10 mg total) by mouth daily. 90 tablet 3   pantoprazole (PROTONIX) 40 MG tablet Take 1 tablet (40 mg total) by mouth 2 (two) times daily. 180 tablet 3   potassium chloride (K-DUR) 10 MEQ tablet Take qd prn with Lasix 90 tablet 1   pseudoephedrine (SUDAFED) 120 MG 12 hr tablet Take 1 tablet (120 mg total) by mouth 2 (two) times daily as needed for congestion. 60 tablet 1   roflumilast (DALIRESP) 500 MCG TABS tablet Take 1  tablet (500 mcg total) by mouth daily. 90 tablet 3   Simethicone (GAS-X PO) Take by mouth.     temazepam (RESTORIL) 15 MG capsule TAKE 1 TO 2 CAPSULES BY MOUTH EVERY NIGHT AT BEDTIME AS NEEDED 180 capsule 1   Tiotropium Bromide Monohydrate (SPIRIVA RESPIMAT) 2.5 MCG/ACT AERS Inhale 2 puffs into the lungs daily. 4 g 11   No facility-administered medications prior to visit.    ROS: Review of Systems  Constitutional:  Positive for fatigue. Negative for activity change, appetite change, chills and unexpected weight change.  HENT:  Negative for congestion, mouth sores and sinus pressure.   Eyes:  Negative for visual disturbance.  Respiratory:  Positive for shortness of breath. Negative for cough and chest tightness.   Gastrointestinal:  Negative for abdominal pain and nausea.  Genitourinary:  Negative for difficulty urinating, frequency and vaginal pain.  Musculoskeletal:  Positive for arthralgias and neck stiffness. Negative for back pain and gait problem.  Skin:  Negative for pallor and rash.  Neurological:  Negative for dizziness, tremors, weakness, numbness and headaches.  Psychiatric/Behavioral:  Positive for decreased concentration, dysphoric mood and sleep disturbance. Negative for confusion and suicidal ideas. The patient is nervous/anxious.    Objective:  BP 140/86 (BP Location: Left Arm)   Pulse 81   Temp 98.2 F (36.8 C) (Oral)  Ht '5\' 1"'$  (1.549 m)   Wt 146 lb 9.6 oz (66.5 kg)   SpO2 97%   BMI 27.70 kg/m   BP Readings from Last 3 Encounters:  05/14/21 140/86  10/07/20 (!) 144/80  08/11/20 140/78    Wt Readings from Last 3 Encounters:  05/14/21 146 lb 9.6 oz (66.5 kg)  10/07/20 154 lb (69.9 kg)  08/11/20 151 lb 12.8 oz (68.9 kg)    Physical Exam Constitutional:      General: She is not in acute distress.    Appearance: She is well-developed.  HENT:     Head: Normocephalic.     Right Ear: External ear normal.     Left Ear: External ear normal.     Nose: Nose  normal.  Eyes:     General:        Right eye: No discharge.        Left eye: No discharge.     Conjunctiva/sclera: Conjunctivae normal.     Pupils: Pupils are equal, round, and reactive to light.  Neck:     Thyroid: No thyromegaly.     Vascular: No JVD.     Trachea: No tracheal deviation.  Cardiovascular:     Rate and Rhythm: Normal rate and regular rhythm.     Heart sounds: Normal heart sounds.  Pulmonary:     Effort: No respiratory distress.     Breath sounds: No stridor. No wheezing.  Abdominal:     General: Bowel sounds are normal. There is no distension.     Palpations: Abdomen is soft. There is no mass.     Tenderness: There is no abdominal tenderness. There is no guarding or rebound.  Musculoskeletal:        General: No tenderness.     Cervical back: Normal range of motion and neck supple. No rigidity.  Lymphadenopathy:     Cervical: No cervical adenopathy.  Skin:    Findings: No erythema or rash.  Neurological:     Mental Status: She is oriented to person, place, and time.     Cranial Nerves: No cranial nerve deficit.     Motor: No abnormal muscle tone.     Coordination: Coordination normal.     Deep Tendon Reflexes: Reflexes normal.  Psychiatric:        Behavior: Behavior normal.        Thought Content: Thought content normal.        Judgment: Judgment normal.    Lab Results  Component Value Date   WBC 8.7 08/11/2020   HGB 14.5 08/11/2020   HCT 43.9 08/11/2020   PLT 206.0 08/11/2020   GLUCOSE 94 08/11/2020   CHOL 234 (H) 10/29/2018   TRIG 167.0 (H) 10/29/2018   HDL 37.00 (L) 10/29/2018   LDLDIRECT 162.8 06/28/2011   LDLCALC 163 (H) 10/29/2018   ALT 21 08/11/2020   AST 25 08/11/2020   NA 141 08/11/2020   K 4.1 08/11/2020   CL 102 08/11/2020   CREATININE 0.71 08/11/2020   BUN 12 08/11/2020   CO2 32 08/11/2020   TSH 1.68 01/28/2020   HGBA1C 5.5 01/28/2020    VAS US CAROTID  Result Date: 08/29/2020 Carotid Arterial Duplex Study Indications:        Carotid artery disease follow-up. No new cerebrovascular                    symptoms Risk Factors:      Hyperlipidemia, current smoker. Comparison Study:  Previous carotid duplex  performed 08/31/19 showed RICA                    velocities of A999333 cm/sec and LICA velocities of [id number]                    cm/sec. Performing Technologist: Mariane Masters RVT  Examination Guidelines: A complete evaluation includes B-mode imaging, spectral Doppler, color Doppler, and power Doppler as needed of all accessible portions of each vessel. Bilateral testing is considered an integral part of a complete examination. Limited examinations for reoccurring indications may be performed as noted.  Right Carotid Findings: +----------+--------+--------+--------+------------------+--------+           PSV cm/sEDV cm/sStenosisPlaque DescriptionComments +----------+--------+--------+--------+------------------+--------+ CCA Prox  100     24                                         +----------+--------+--------+--------+------------------+--------+ CCA Distal63      19                                         +----------+--------+--------+--------+------------------+--------+ ICA Prox  163     50      40-59%  heterogenous               +----------+--------+--------+--------+------------------+--------+ ICA Mid   88      29                                         +----------+--------+--------+--------+------------------+--------+ ICA Distal57      19                                         +----------+--------+--------+--------+------------------+--------+ ECA       133     30                                         +----------+--------+--------+--------+------------------+--------+ +----------+--------+-------+----------------+-------------------+           PSV cm/sEDV cmsDescribe        Arm Pressure (mmHG) +----------+--------+-------+----------------+-------------------+  [id number]            Multiphasic, [id number]                 +----------+--------+-------+----------------+-------------------+ +---------+--------+--+--------+--+---------+ VertebralPSV cm/s43EDV cm/s10Antegrade +---------+--------+--+--------+--+---------+  Left Carotid Findings: +----------+--------+--------+--------+------------------+--------+           PSV cm/sEDV cm/sStenosisPlaque DescriptionComments +----------+--------+--------+--------+------------------+--------+ CCA Prox  106     24                                         +----------+--------+--------+--------+------------------+--------+ CCA Distal79      19                                         +----------+--------+--------+--------+------------------+--------+ ICA Prox  56      16  heterogenous               +----------+--------+--------+--------+------------------+--------+ ICA Mid   99      27      1-39%                              +----------+--------+--------+--------+------------------+--------+ ICA Distal88      29                                         +----------+--------+--------+--------+------------------+--------+ ECA       81      11                                         +----------+--------+--------+--------+------------------+--------+ +----------+--------+--------+----------------+-------------------+           PSV cm/sEDV cm/sDescribe        Arm Pressure (mmHG) +----------+--------+--------+----------------+-------------------+ [id number]             Multiphasic, [id number]                 +----------+--------+--------+----------------+-------------------+ +---------+--------+--+--------+-+---------+ VertebralPSV cm/s43EDV cm/s9Antegrade +---------+--------+--+--------+-+---------+   Summary: Right Carotid: Velocities in the right ICA are consistent with a 40-59%                stenosis. Left Carotid: Velocities in the left ICA are  consistent with a 1-39% stenosis. Vertebrals:  Bilateral vertebral arteries demonstrate antegrade flow. Subclavians: Normal flow hemodynamics were seen in bilateral subclavian              arteries. *See table(s) above for measurements and observations. Suggest follow up study in 12 months. Electronically signed by Larae Grooms MD on 08/29/2020 at 6:03:25 PM.    Final     Assessment & Plan:     Walker Kehr, MD

## 2021-05-14 NOTE — Assessment & Plan Note (Addendum)
Mild cognitive deficit-try [place] Mushroom extract or capsules for memory.  Other options discussed

## 2021-05-14 NOTE — Assessment & Plan Note (Signed)
Magnesium spray for cramps Tylenol pm

## 2021-05-14 NOTE — Assessment & Plan Note (Addendum)
Pt declined antidepressants due to side effects. Continue with Xanax prn.  Potential benefits of a long term benzodiazepines  use as well as potential risks  and complications were explained to the patient and were aknowledged.

## 2021-05-16 NOTE — Assessment & Plan Note (Signed)
We will continue with Daliresp.  She seems to be tolerating it okay

## 2021-05-31 ENCOUNTER — Telehealth: Payer: Self-pay | Admitting: Internal Medicine

## 2021-05-31 DIAGNOSIS — D509 Iron deficiency anemia, unspecified: Secondary | ICD-10-CM

## 2021-05-31 NOTE — Telephone Encounter (Signed)
Requesting a callback to go over lab results.   Please advise.

## 2021-06-04 NOTE — Telephone Encounter (Signed)
Called.  Left a voicemail message regarding anemia.  The patient was instructed to go to the lab for CBC, iron studies.

## 2021-06-11 ENCOUNTER — Other Ambulatory Visit: Payer: Self-pay

## 2021-06-11 ENCOUNTER — Other Ambulatory Visit (INDEPENDENT_AMBULATORY_CARE_PROVIDER_SITE_OTHER): Payer: Medicare HMO

## 2021-06-11 DIAGNOSIS — D509 Iron deficiency anemia, unspecified: Secondary | ICD-10-CM

## 2021-06-11 LAB — CBC WITH DIFFERENTIAL/PLATELET
Basophils Absolute: 0.1 10*3/uL (ref 0.0–0.1)
Basophils Relative: 1.2 % (ref 0.0–3.0)
Eosinophils Absolute: 0.6 10*3/uL (ref 0.0–0.7)
Eosinophils Relative: 8.3 % — ABNORMAL HIGH (ref 0.0–5.0)
HCT: 35.2 % — ABNORMAL LOW (ref 36.0–46.0)
Hemoglobin: 10.9 g/dL — ABNORMAL LOW (ref 12.0–15.0)
Lymphocytes Relative: 42.2 % (ref 12.0–46.0)
Lymphs Abs: 3.1 10*3/uL (ref 0.7–4.0)
MCHC: 31 g/dL (ref 30.0–36.0)
MCV: 71.4 fl — ABNORMAL LOW (ref 78.0–100.0)
Monocytes Absolute: 0.5 10*3/uL (ref 0.1–1.0)
Monocytes Relative: 7.2 % (ref 3.0–12.0)
Neutro Abs: 3 10*3/uL (ref 1.4–7.7)
Neutrophils Relative %: 41.1 % — ABNORMAL LOW (ref 43.0–77.0)
Platelets: 222 10*3/uL (ref 150.0–400.0)
RBC: 4.94 Mil/uL (ref 3.87–5.11)
RDW: 17.1 % — ABNORMAL HIGH (ref 11.5–15.5)
WBC: 7.3 10*3/uL (ref 4.0–10.5)

## 2021-06-12 LAB — IRON,TIBC AND FERRITIN PANEL
%SAT: 9 % (calc) — ABNORMAL LOW (ref 16–45)
Ferritin: 6 ng/mL — ABNORMAL LOW (ref 16–288)
Iron: 36 ug/dL — ABNORMAL LOW (ref 45–160)
TIBC: 410 mcg/dL (calc) (ref 250–450)

## 2021-06-25 ENCOUNTER — Other Ambulatory Visit: Payer: Self-pay | Admitting: Internal Medicine

## 2021-06-25 DIAGNOSIS — D490 Neoplasm of unspecified behavior of digestive system: Secondary | ICD-10-CM

## 2021-06-25 DIAGNOSIS — D509 Iron deficiency anemia, unspecified: Secondary | ICD-10-CM

## 2021-06-25 MED ORDER — IRON (FERROUS SULFATE) 325 (65 FE) MG PO TABS: 325.0000 mg | ORAL_TABLET | Freq: Every day | ORAL | 5 refills | Status: DC

## 2021-07-15 ENCOUNTER — Telehealth (INDEPENDENT_AMBULATORY_CARE_PROVIDER_SITE_OTHER): Payer: Medicare HMO | Admitting: Internal Medicine

## 2021-07-15 DIAGNOSIS — F331 Major depressive disorder, recurrent, moderate: Secondary | ICD-10-CM

## 2021-07-15 DIAGNOSIS — J449 Chronic obstructive pulmonary disease, unspecified: Secondary | ICD-10-CM

## 2021-07-15 DIAGNOSIS — F4323 Adjustment disorder with mixed anxiety and depressed mood: Secondary | ICD-10-CM | POA: Diagnosis not present

## 2021-07-15 DIAGNOSIS — R413 Other amnesia: Secondary | ICD-10-CM

## 2021-07-15 DIAGNOSIS — D490 Neoplasm of unspecified behavior of digestive system: Secondary | ICD-10-CM | POA: Diagnosis not present

## 2021-07-15 DIAGNOSIS — R69 Illness, unspecified: Secondary | ICD-10-CM | POA: Diagnosis not present

## 2021-07-15 MED ORDER — CEFDINIR 300 MG PO CAPS: 300.0000 mg | ORAL_CAPSULE | Freq: Two times a day (BID) | ORAL | 0 refills | Status: DC

## 2021-07-15 MED ORDER — BUPROPION HCL ER (XL) 150 MG PO TB24: 150.0000 mg | ORAL_TABLET | Freq: Every morning | ORAL | 5 refills | Status: DC

## 2021-07-15 MED ORDER — FERROUS SULFATE 325 (65 FE) MG PO TABS: ORAL_TABLET | ORAL | 5 refills | Status: DC

## 2021-07-15 NOTE — Progress Notes (Signed)
Virtual Visit via Telephone Note  I connected with Theresa Aguirre on 07/15/21 at  3:20 PM EDT by telephone and verified that I am speaking with the correct person using two identifiers.  Location: Patient: home Provider: office No one else is present in the visit   I discussed the limitations, risks, security and privacy concerns of performing an evaluation and management service by telephone and the availability of in person appointments. I also discussed with the patient that there may be a patient responsible charge related to this service. The patient expressed understanding and agreed to proceed.   History of Present Illness:  The patient is complaining of worsening depression, sadness, moodiness.  She has been anxious.  Not suicidal. Complaining of sinus congestion, green nasal discharge of 1-2 weeks duration. Follow-up on COPD.  She is worried about her Daliresp potential medication side effects.  Observations/Objective:  The patient sounds normal on the phone. Assessment and Plan:  See plan Follow Up Instructions:    I discussed the assessment and treatment plan with the patient. The patient was provided an opportunity to ask questions and all were answered. The patient agreed with the plan and demonstrated an understanding of the instructions.   The patient was advised to call back or seek an in-person evaluation if the symptoms worsen or if the condition fails to improve as anticipated.  I provided 22 minutes of non-face-to-face time during this encounter.   Walker Kehr, MD

## 2021-07-15 NOTE — Assessment & Plan Note (Signed)
Worse - start wellbutrin. Continue with Xanax prn.  Potential benefits of a long term benzodiazepines  use as well as potential risks  and complications were explained to the patient and were aknowledged.

## 2021-07-15 NOTE — Assessment & Plan Note (Signed)
Will treat depression

## 2021-07-15 NOTE — Assessment & Plan Note (Addendum)
She is worried about her Daliresp potential medication side effects.  She was reassured.  Continue with Daliresp

## 2021-07-15 NOTE — Assessment & Plan Note (Addendum)
Worse due to multiple factors. Start Wellbutrin XL qd.  Continue with as needed alprazolam

## 2021-07-16 ENCOUNTER — Encounter: Payer: Self-pay | Admitting: Internal Medicine

## 2021-07-16 NOTE — Assessment & Plan Note (Signed)
GI appointment is pending

## 2021-07-30 ENCOUNTER — Encounter: Payer: Self-pay | Admitting: Physician Assistant

## 2021-08-11 ENCOUNTER — Other Ambulatory Visit: Payer: Self-pay | Admitting: *Deleted

## 2021-08-11 MED ORDER — HYDROCHLOROTHIAZIDE 12.5 MG PO CAPS
ORAL_CAPSULE | ORAL | 3 refills | Status: DC
Start: 2021-08-11 — End: 2021-08-11

## 2021-08-11 MED ORDER — HYDROCHLOROTHIAZIDE 12.5 MG PO CAPS
ORAL_CAPSULE | ORAL | 3 refills | Status: DC
Start: 2021-08-11 — End: 2022-09-08

## 2021-08-11 NOTE — Addendum Note (Signed)
Addended by: Earnstine Regal on: 08/11/2021 01:43 PM   Modules accepted: Orders

## 2021-08-18 ENCOUNTER — Ambulatory Visit: Payer: Medicare HMO | Admitting: Physician Assistant

## 2021-08-18 ENCOUNTER — Encounter: Payer: Self-pay | Admitting: Physician Assistant

## 2021-08-18 ENCOUNTER — Other Ambulatory Visit (INDEPENDENT_AMBULATORY_CARE_PROVIDER_SITE_OTHER): Payer: Medicare HMO

## 2021-08-18 VITALS — BP 146/70 | HR 62 | Ht 61.0 in | Wt 146.0 lb

## 2021-08-18 DIAGNOSIS — D509 Iron deficiency anemia, unspecified: Secondary | ICD-10-CM

## 2021-08-18 DIAGNOSIS — R1010 Upper abdominal pain, unspecified: Secondary | ICD-10-CM

## 2021-08-18 DIAGNOSIS — G8929 Other chronic pain: Secondary | ICD-10-CM | POA: Diagnosis not present

## 2021-08-18 DIAGNOSIS — K219 Gastro-esophageal reflux disease without esophagitis: Secondary | ICD-10-CM

## 2021-08-18 LAB — BASIC METABOLIC PANEL
BUN: 15 mg/dL (ref 6–23)
CO2: 30 mEq/L (ref 19–32)
Calcium: 9.3 mg/dL (ref 8.4–10.5)
Chloride: 104 mEq/L (ref 96–112)
Creatinine, Ser: 0.74 mg/dL (ref 0.40–1.20)
GFR: 78.82 mL/min (ref >60.00)
Glucose, Bld: 88 mg/dL (ref 70–99)
Potassium: 3.6 mEq/L (ref 3.5–5.1)
Sodium: 141 mEq/L (ref 135–145)

## 2021-08-18 MED ORDER — PANTOPRAZOLE SODIUM 40 MG PO TBEC
40.0000 mg | DELAYED_RELEASE_TABLET | Freq: Two times a day (BID) | ORAL | 3 refills | Status: DC
Start: 2021-08-18 — End: 2023-08-29

## 2021-08-18 NOTE — Progress Notes (Signed)
Chief Complaint: Iron deficiency anemia, abdominal pain  HPI:    [name] is a 75 year old Caucasian female with a past medical history as listed below, known to Dr. Henrene Aguirre, who was referred to me by Theresa Aguirre, Theresa Lacks, MD for a complaint of iron deficiency anemia and abdominal pain.    07/13/2016 EGD with LA grade a reflux esophagitis and a medium sized hiatal hernia with submucosal bulge of uncertain clinical significance and a single nonbleeding angiodysplastic lesion in the stomach.  Normal duodenum.  EUS recommended.    09/22/2016 EUS was incomplete.  Dr. Ardis Aguirre is not able to advance the echoendoscope nor a standard adult gastroscope into the proximal esophagus due to what appeared to be very tortuous and perhaps slightly stenotic proximal esophagus.    07/25/2018 colonoscopy done for positive Cologuard testing with diverticulosis in the sigmoid and transverse colon and internal hemorrhoids.  Repeat for surveillance was not recommended due to current age.    06/11/2021 CBC with a hemoglobin of 10.9 (11.3 on 05/14/2021, 14.5 in 08/11/2020).  MCV low at 71.4.  Iron studies and iron low at 36, ferritin low at 6, percent saturation low at 9%.  Patient was told to start oral iron supplementation and she was referred to see [place].    Today, the patient tells me that she has had iron deficiency anemia in the past and in fact had a work-up which she thinks included EGD and colonoscopy (tells me that the colonoscopy was hard for her because she kept vomiting the prep).  Tells me that she was supposed to be on oral iron supplementation but finds this hard to take because "my body just does not agree with it".  Describes feeling nauseous with a change in bowel habits.  Tells me that she does not want to have endoscopic procedures now.    Also continues with a complaint of abdominal pain, typically in the left upper quadrant chronic over the past 10 years or so which feels like a "heaviness".  Along with this  has a lot of bloating and gas.  Currently taking her Pantoprazole 40 mg once daily.  She has been on this for "40 years".    Patient tells me that she is somewhat depressed and stays at home a lot of the time taking care of her husband.    Denies fever, chills, weight loss, blood in her stool, melena or symptoms that awaken her from sleep.  Past Medical History:  Diagnosis Date   Allergy    Anxiety    Arthropathy, unspecified, site unspecified    Asthma    Back pain    Cataract    Chronic airway obstruction, not elsewhere classified    Depressive disorder, not elsewhere classified    Disorder of bone and cartilage, unspecified    Esophageal reflux    Gluten intolerance    ? Possible   Hypertension    Neck pain    Other abnormal glucose    Other and unspecified hyperlipidemia    diet controlled, no med   Paresthesias in right hand    1-2-3 digits post CTS surgery    Past Surgical History:  Procedure Laterality Date   ABDOMINAL HYSTERECTOMY     CARPAL TUNNEL RELEASE     Bilateral   COLONOSCOPY  15 years ago    in [place] with Dr.Kaplan   ELBOW SURGERY     Bilateral   EUS N/A 09/22/2016   Procedure: ESOPHAGEAL ENDOSCOPIC ULTRASOUND (EUS) RADIAL;  Surgeon: Theresa Banister, MD;  Location: [facility];  Service: Endoscopy;  Laterality: N/A;    Current Outpatient Medications  Medication Sig Dispense Refill   ALPRAZolam (XANAX) 1 MG tablet Take 0.5-1 tablets (0.5-1 mg total) by mouth 3 (three) times daily as needed for anxiety. 270 tablet 1   aspirin (BAYER ASPIRIN) 325 MG tablet Take 0.5 tablets (162 mg total) by mouth daily. 100 tablet 3   b complex vitamins tablet Take 1 tablet by mouth daily. 100 tablet 3   buPROPion (WELLBUTRIN XL) 150 MG 24 hr tablet Take 1 tablet (150 mg total) by mouth in the morning. 30 tablet 5   cefdinir (OMNICEF) 300 MG capsule Take 1 capsule (300 mg total) by mouth 2 (two) times daily. 20 capsule 0   clobetasol cream (TEMOVATE) 6.31 % Apply 1  application topically 2 (two) times daily. 60 g 3   cloNIDine (CATAPRES) 0.1 MG tablet Take 1 tablet (0.1 mg total) by mouth 3 (three) times daily as needed (take if top blood pressure is over 180). 90 tablet 3   ferrous sulfate (FEROSUL) 325 (65 FE) MG tablet TAKE 1 TABLET(325 MG) BY MOUTH DAILY 30 tablet 5   furosemide (LASIX) 20 MG tablet Take 1 tablet (20 mg total) by mouth 2 (two) times daily as needed for edema. 90 tablet 1   hydrochlorothiazide (MICROZIDE) 12.5 MG capsule TAKE 1 CAPSULE(12.5 MG) BY MOUTH DAILY 90 capsule 3   levalbuterol (XOPENEX HFA) 45 MCG/ACT inhaler INHALE 2 PUFFS INTO THE LUNGS EVERY 6 HOURS AS NEEDED FOR WHEEZING OR SHORTNESS OF BREATH 45 g 3   lipase/protease/amylase (CREON) 36000 UNITS CPEP capsule Take 1 capsule (36,000 Units total) by mouth 3 (three) times daily before meals. 90 capsule 11   loratadine (CLARITIN) 10 MG tablet Take 1 tablet (10 mg total) by mouth daily. 90 tablet 3   pantoprazole (PROTONIX) 40 MG tablet Take 1 tablet (40 mg total) by mouth 2 (two) times daily. 180 tablet 3   potassium chloride (K-DUR) 10 MEQ tablet Take qd prn with Lasix 90 tablet 1   pseudoephedrine (SUDAFED) 120 MG 12 hr tablet Take 1 tablet (120 mg total) by mouth 2 (two) times daily as needed for congestion. 60 tablet 1   roflumilast (DALIRESP) 500 MCG TABS tablet Take 1 tablet (500 mcg total) by mouth daily. 90 tablet 3   Simethicone (GAS-X PO) Take by mouth.     temazepam (RESTORIL) 15 MG capsule 1-2 qhs prn 60 capsule 5   Tiotropium Bromide Monohydrate (SPIRIVA RESPIMAT) 2.5 MCG/ACT AERS Inhale 2 puffs into the lungs daily. 4 g 11   No current facility-administered medications for this visit.    Allergies as of 08/18/2021 - Review Complete 07/16/2021  Allergen Reaction Noted   Albuterol  10/04/2018   Desvenlafaxine  01/21/2010   Doxycycline  05/22/2008   Duloxetine  08/14/2009   Fluoxetine hcl  08/14/2009   Pravastatin  04/03/2019    Family History  Problem  Relation Age of Onset   Cancer Mother 58       brain tumor   Arthritis Father        RA   Hypertension Other    Colon cancer Neg Hx    Esophageal cancer Neg Hx    Rectal cancer Neg Hx    Stomach cancer Neg Hx    Colon polyps Neg Hx     Social History   Socioeconomic History   Marital status: Married    Spouse name: Not  on file   Number of children: Not on file   Years of education: Not on file   Highest education level: Not on file  Occupational History   Not on file  Tobacco Use   Smoking status: Every Day    Packs/day: 0.25    Years: 40.00    Pack years: 10.00    Types: Cigarettes   Smokeless tobacco: Never   Tobacco comments:    I usually smoke about 10 cigarettes a day.  Regular Exercise-No  Vaping Use   Vaping Use: Never used  Substance and Sexual Activity   Alcohol use: No   Drug use: Yes    Types: Marijuana    Comment: occ. to sleep per pt   Sexual activity: Not on file    Comment: Hysterectomy  Other Topics Concern   Not on file  Social History Narrative   Not on file   Social Determinants of Health   Financial Resource Strain: Not on file  Food Insecurity: Not on file  Transportation Needs: Not on file  Physical Activity: Not on file  Stress: Not on file  Social Connections: Not on file  Intimate Partner Violence: Not on file    Review of Systems:    Constitutional: No weight loss, fever or chills Skin: No rash Cardiovascular: No chest pain   Respiratory: No SOB  Gastrointestinal: See HPI and otherwise negative Genitourinary: No dysuria  Neurological: No headache, dizziness or syncope Musculoskeletal: No new muscle or joint pain Hematologic: No bleeding or bruising Psychiatric: +depression   Physical Exam:  Vital signs: BP (!) 146/70   Pulse 62   Ht 5\' 1"  (1.549 m)   Wt 146 lb (66.2 kg)   BMI 27.59 kg/m    Constitutional:   Pleasant Elderly Caucasian female appears to be in NAD, Well developed, Well nourished, alert and  cooperative Head:  Normocephalic and atraumatic. Eyes:   PEERL, EOMI. No icterus. Conjunctiva pink. Ears:  Normal auditory acuity. Neck:  Supple Throat: Oral cavity and pharynx without inflammation, swelling or lesion.  Respiratory: Respirations even and unlabored. Lungs clear to auscultation bilaterally.   No wheezes, crackles, or rhonchi.  Cardiovascular: Normal S1, S2. No MRG. Regular rate and rhythm. No peripheral edema, cyanosis or pallor.  Gastrointestinal:  Soft, nondistended, nontender. No rebound or guarding. Normal bowel sounds. No appreciable masses or hepatomegaly. Rectal:  Not performed.  Msk:  Symmetrical without gross deformities. Without edema, no deformity or joint abnormality.  Neurologic:  Alert and  oriented x4;  grossly normal neurologically.  Skin:   Dry and intact without significant lesions or rashes. Psychiatric: Demonstrates good judgement and reason without abnormal affect or behaviors.  RELEVANT LABS AND IMAGING: CBC    Component Value Date/Time   WBC 7.3 06/11/2021 1403   RBC 4.94 06/11/2021 1403   HGB 10.9 (L) 06/11/2021 1403   HCT 35.2 (L) 06/11/2021 1403   PLT 222.0 06/11/2021 1403   MCV 71.4 (L) 06/11/2021 1403   MCHC 31.0 06/11/2021 1403   RDW 17.1 (H) 06/11/2021 1403   LYMPHSABS 3.1 06/11/2021 1403   MONOABS 0.5 06/11/2021 1403   EOSABS 0.6 06/11/2021 1403   BASOSABS 0.1 06/11/2021 1403    CMP     Component Value Date/Time   NA 138 05/14/2021 1458   K 3.8 05/14/2021 1458   CL 100 05/14/2021 1458   CO2 32 05/14/2021 1458   GLUCOSE 92 05/14/2021 1458   BUN 11 05/14/2021 1458   CREATININE 0.69 05/14/2021 1458  CALCIUM 9.7 05/14/2021 1458   PROT 7.5 05/14/2021 1458   ALBUMIN 4.5 05/14/2021 1458   AST 25 05/14/2021 1458   ALT 16 05/14/2021 1458   ALKPHOS 67 05/14/2021 1458   BILITOT 0.6 05/14/2021 1458   GFRNONAA 108.82 05/05/2010 0846   GFRAA 109 12/02/2008 1104    Assessment: 1.  Iron deficiency anemia: Chronic for the patient,  unable to tolerate oral iron, declines endoscopic work-up today; consider relation to GI tract versus other 2.  Abdominal pain: Chronic for the patient over the past 10 years, previous evaluation with colonoscopy and upper endoscopy without any significant finding, could be related to gastritis as below 3.  GERD: Chronic for the patient, still discusses some upper abdominal "heaviness", and gas/bloating; consider ongoing gastritis  Plan: 1.  Discussed EGD and colonoscopy.  These were difficult exams in the past for the patient and she does not wish to have these again.  Did discuss that these were also technically difficult.  Explained that another way to work-up her anemia would be with a CT of the abdomen pelvis to ensure there are no overt cancers.  She would like to have this done.  Ordered CT of the abdomen pelvis with contrast. 2.  Ordered BMP to check kidney function 3.  Will send my nurse a note to have patient arrange for IV iron infusions x2.  Patient tells me she is unable to handle oral iron supplementation. 4.  Pending results from above it may be best for patient to follow with a hematologist in regards to ongoing anemia if she does not want to be further evaluated.  They can then help with her infusions and monitoring in the future. 5.  Increased Pantoprazole to 40 mg twice daily, 30-60 minutes for breakfast and dinner.  Prescribed #60 with 3 refills.  If this does not help then could consider changing to a different PPI as she has been on this one for a long period of time 6.  Patient to follow in clinic per recommendations after imaging.  Ellouise Newer, PA-C [facility] 08/18/2021, 3:14 PM  Cc: Cassandria Anger, MD

## 2021-08-18 NOTE — Progress Notes (Signed)
Assessment and plan as noted 

## 2021-08-18 NOTE — Patient Instructions (Signed)
Your provider has requested that you go to the basement level for lab work before leaving today. Press "B" on the elevator. The lab is located at the first door on the left as you exit the elevator.  Increase Pantoprazole 40 mg twice daily 30-60 minutes before breakfast & dinner.   You have been scheduled for a CT scan of the abdomen and pelvis at [facility] ([address]---this is in the same building as [employer]).   You are scheduled on Wednesday 08/25/21 at 3:45 pm. You should arrive 15 minutes prior to your appointment time for registration. Please follow the written instructions below on the day of your exam:  WARNING: IF YOU ARE ALLERGIC TO IODINE/X-RAY DYE, PLEASE NOTIFY RADIOLOGY IMMEDIATELY AT [phone]! YOU WILL BE GIVEN A 13 HOUR PREMEDICATION PREP.  1) Do not eat or drink anything after 11:45 am (4 hours prior to your test) 2) You have been given 2 bottles of oral contrast to drink. The solution may taste better if refrigerated, but do NOT add ice or any other liquid to this solution. Shake well before drinking.    Drink 1 bottle of contrast @ 1:45 pm (2 hours prior to your exam)  Drink 1 bottle of contrast @ 2:45 pm (1 hour prior to your exam)  You may take any medications as prescribed with a small amount of water, if necessary. If you take any of the following medications: METFORMIN, GLUCOPHAGE, GLUCOVANCE, AVANDAMET, RIOMET, FORTAMET, [place] MET, JANUMET, GLUMETZA or METAGLIP, you MAY be asked to HOLD this medication 48 hours AFTER the exam.  The purpose of you drinking the oral contrast is to aid in the visualization of your intestinal tract. The contrast solution may cause some diarrhea. Depending on your individual set of symptoms, you may also receive an intravenous injection of x-ray contrast/dye. Plan on being at [facility] for 30 minutes or longer, depending on the type of exam you are having performed.  This test typically takes  30-45 minutes to complete.  If you have any questions regarding your exam or if you need to reschedule, you may call the CT department at [phone] between the hours of 8:00 am and 5:00 pm, Monday-Friday.  ___________________________________________________________________

## 2021-08-25 ENCOUNTER — Other Ambulatory Visit: Payer: Self-pay

## 2021-08-25 ENCOUNTER — Ambulatory Visit (INDEPENDENT_AMBULATORY_CARE_PROVIDER_SITE_OTHER)
Admission: RE | Admit: 2021-08-25 | Discharge: 2021-08-25 | Disposition: A | Discharge status: Home / Self Care | Payer: Medicare HMO | Source: Ambulatory Visit | Attending: Physician Assistant | Admitting: Physician Assistant

## 2021-08-25 DIAGNOSIS — D509 Iron deficiency anemia, unspecified: Secondary | ICD-10-CM | POA: Diagnosis not present

## 2021-08-25 DIAGNOSIS — K760 Fatty (change of) liver, not elsewhere classified: Secondary | ICD-10-CM | POA: Diagnosis not present

## 2021-08-25 DIAGNOSIS — R109 Unspecified abdominal pain: Secondary | ICD-10-CM | POA: Diagnosis not present

## 2021-08-25 MED ORDER — IOHEXOL 350 MG/ML SOLN
80.0000 mL | Freq: Once | INTRAVENOUS | Status: AC | PRN
  Administered 2021-08-25: 80 mL via INTRAVENOUS

## 2021-08-27 ENCOUNTER — Other Ambulatory Visit: Payer: Self-pay

## 2021-08-27 DIAGNOSIS — D509 Iron deficiency anemia, unspecified: Secondary | ICD-10-CM

## 2021-08-30 ENCOUNTER — Encounter: Payer: Self-pay | Admitting: *Deleted

## 2021-08-30 ENCOUNTER — Telehealth: Payer: Self-pay | Admitting: *Deleted

## 2021-08-30 DIAGNOSIS — D509 Iron deficiency anemia, unspecified: Secondary | ICD-10-CM

## 2021-08-30 NOTE — Progress Notes (Signed)
Orders entered for lab work of CBC D, prepare smear slide and hold SST for New patient appt

## 2021-09-06 ENCOUNTER — Telehealth: Payer: Self-pay | Admitting: Physician Assistant

## 2021-09-06 NOTE — Telephone Encounter (Signed)
Patient called would like to know what her next step would be.

## 2021-09-06 NOTE — Telephone Encounter (Signed)
Left message on voicemail for pt to call back

## 2021-09-07 NOTE — Telephone Encounter (Signed)
Lm on vm for patient to return call 

## 2021-09-09 NOTE — Telephone Encounter (Signed)
Called and spoke with patient. She was advised that the next step would be Hematology consultation. Per referral notes, they have tried to contact patient several times and have not received a return call. I offered to provide patient with their office contact number. Pt states that she would like to discuss this with her PCP and get their recommendation. Pt states that if her PCP is insistent on her having the hematology consultation, she will consider it. Pt had no concerns at the end of the call.

## 2021-10-13 ENCOUNTER — Encounter: Payer: Self-pay | Admitting: Internal Medicine

## 2021-10-13 ENCOUNTER — Other Ambulatory Visit: Payer: Self-pay

## 2021-10-13 ENCOUNTER — Ambulatory Visit (INDEPENDENT_AMBULATORY_CARE_PROVIDER_SITE_OTHER): Payer: Medicare HMO | Admitting: Internal Medicine

## 2021-10-13 VITALS — BP 146/78 | HR 87 | Temp 98.2°F | Ht 61.0 in | Wt 140.2 lb

## 2021-10-13 DIAGNOSIS — J310 Chronic rhinitis: Secondary | ICD-10-CM | POA: Diagnosis not present

## 2021-10-13 DIAGNOSIS — I6523 Occlusion and stenosis of bilateral carotid arteries: Secondary | ICD-10-CM

## 2021-10-13 DIAGNOSIS — F331 Major depressive disorder, recurrent, moderate: Secondary | ICD-10-CM

## 2021-10-13 DIAGNOSIS — D509 Iron deficiency anemia, unspecified: Secondary | ICD-10-CM | POA: Diagnosis not present

## 2021-10-13 DIAGNOSIS — J449 Chronic obstructive pulmonary disease, unspecified: Secondary | ICD-10-CM

## 2021-10-13 DIAGNOSIS — K2101 Gastro-esophageal reflux disease with esophagitis, with bleeding: Secondary | ICD-10-CM | POA: Diagnosis not present

## 2021-10-13 DIAGNOSIS — F4323 Adjustment disorder with mixed anxiety and depressed mood: Secondary | ICD-10-CM | POA: Diagnosis not present

## 2021-10-13 DIAGNOSIS — Z23 Encounter for immunization: Secondary | ICD-10-CM

## 2021-10-13 DIAGNOSIS — R69 Illness, unspecified: Secondary | ICD-10-CM | POA: Diagnosis not present

## 2021-10-13 MED ORDER — OXYMETAZOLINE HCL 0.05 % NA SOLN: 1.0000 | Freq: Two times a day (BID) | NASAL | 1 refills | Status: DC

## 2021-10-13 MED ORDER — PSEUDOEPHEDRINE HCL ER 120 MG PO TB12: 120.0000 mg | ORAL_TABLET | Freq: Two times a day (BID) | ORAL | 1 refills | Status: DC | PRN

## 2021-10-13 MED ORDER — PSEUDOEPHEDRINE HCL ER 120 MG PO TB12: 120.0000 mg | ORAL_TABLET | Freq: Two times a day (BID) | ORAL | 1 refills | Status: AC | PRN

## 2021-10-13 MED ORDER — OXYMETAZOLINE HCL 0.05 % NA SOLN: 1.0000 | Freq: Two times a day (BID) | NASAL | 1 refills | Status: AC

## 2021-10-13 MED ORDER — LORATADINE 10 MG PO TABS: 10.0000 mg | ORAL_TABLET | Freq: Every day | ORAL | 3 refills | Status: DC

## 2021-10-13 NOTE — Assessment & Plan Note (Signed)
Increase Protonix to bid

## 2021-10-13 NOTE — Assessment & Plan Note (Signed)
Start Wellbutrin XL qd.   Continue with as needed alprazolam

## 2021-10-13 NOTE — Assessment & Plan Note (Signed)
Sudafed  Claritin daily Sudafed tablets and Afrin nasal spray as needed for congestion

## 2021-10-13 NOTE — Assessment & Plan Note (Signed)
Continue with as needed alprazolam 

## 2021-10-13 NOTE — Progress Notes (Signed)
Subjective:  Patient ID: Theresa Aguirre, female    DOB: January 02, 1946  Age: 76 y.o. MRN: [id number]  CC: Follow-up (3 month f/u- Flu shot)   HPI Theresa Aguirre presents for COPD, PAD - carotids, anxiety and depression f/u C/o sinus congestion L>R  and runny nose  Outpatient Medications Prior to Visit  Medication Sig Dispense Refill   ALPRAZolam (XANAX) 1 MG tablet Take 0.5-1 tablets (0.5-1 mg total) by mouth 3 (three) times daily as needed for anxiety. 270 tablet 1   aspirin (BAYER ASPIRIN) 325 MG tablet Take 0.5 tablets (162 mg total) by mouth daily. 100 tablet 3   b complex vitamins tablet Take 1 tablet by mouth daily. 100 tablet 3   buPROPion (WELLBUTRIN XL) 150 MG 24 hr tablet Take 1 tablet (150 mg total) by mouth in the morning. 30 tablet 5   clobetasol cream (TEMOVATE) 3.32 % Apply 1 application topically 2 (two) times daily. 60 g 3   cloNIDine (CATAPRES) 0.1 MG tablet Take 1 tablet (0.1 mg total) by mouth 3 (three) times daily as needed (take if top blood pressure is over 180). 90 tablet 3   ferrous sulfate (FEROSUL) 325 (65 FE) MG tablet TAKE 1 TABLET(325 MG) BY MOUTH DAILY 30 tablet 5   furosemide (LASIX) 20 MG tablet Take 1 tablet (20 mg total) by mouth 2 (two) times daily as needed for edema. 90 tablet 1   hydrochlorothiazide (MICROZIDE) 12.5 MG capsule TAKE 1 CAPSULE(12.5 MG) BY MOUTH DAILY 90 capsule 3   levalbuterol (XOPENEX HFA) 45 MCG/ACT inhaler INHALE 2 PUFFS INTO THE LUNGS EVERY 6 HOURS AS NEEDED FOR WHEEZING OR SHORTNESS OF BREATH 45 g 3   lipase/protease/amylase (CREON) 36000 UNITS CPEP capsule Take 1 capsule (36,000 Units total) by mouth 3 (three) times daily before meals. 90 capsule 11   pantoprazole (PROTONIX) 40 MG tablet Take 1 tablet (40 mg total) by mouth 2 (two) times daily. 60 tablet 3   potassium chloride (K-DUR) 10 MEQ tablet Take qd prn with Lasix 90 tablet 1   roflumilast (DALIRESP) 500 MCG TABS tablet Take 1 tablet (500 mcg total) by mouth  daily. 90 tablet 3   Simethicone (GAS-X PO) Take by mouth.     temazepam (RESTORIL) 15 MG capsule 1-2 qhs prn 60 capsule 5   Tiotropium Bromide Monohydrate (SPIRIVA RESPIMAT) 2.5 MCG/ACT AERS Inhale 2 puffs into the lungs daily. 4 g 11   loratadine (CLARITIN) 10 MG tablet Take 1 tablet (10 mg total) by mouth daily. 90 tablet 3   cefdinir (OMNICEF) 300 MG capsule Take 1 capsule (300 mg total) by mouth 2 (two) times daily. (Patient not taking: Reported on 10/13/2021) 20 capsule 0   No facility-administered medications prior to visit.    ROS: Review of Systems  Constitutional:  Positive for fatigue. Negative for activity change, appetite change, chills and unexpected weight change.  HENT:  Negative for congestion, mouth sores and sinus pressure.   Eyes:  Negative for visual disturbance.  Respiratory:  Negative for cough and chest tightness.   Gastrointestinal:  Negative for abdominal pain and nausea.  Genitourinary:  Negative for difficulty urinating, frequency and vaginal pain.  Musculoskeletal:  Positive for arthralgias. Negative for back pain and gait problem.  Skin:  Negative for pallor and rash.  Neurological:  Negative for dizziness, tremors, weakness, numbness and headaches.  Hematological:  Positive for adenopathy. Bruises/bleeds easily.  Psychiatric/Behavioral:  Negative for confusion, sleep disturbance and suicidal ideas.    Objective:  BP Marland Kitchen)  146/78 (BP Location: Left Arm)    Pulse 87    Temp 98.2 F (36.8 C) (Oral)    Ht 5\' 1"  (1.549 m)    Wt 140 lb 3.2 oz (63.6 kg)    SpO2 97%    BMI 26.49 kg/m   BP Readings from Last 3 Encounters:  10/13/21 (!) 146/78  08/18/21 (!) 146/70  05/14/21 140/86    Wt Readings from Last 3 Encounters:  10/13/21 140 lb 3.2 oz (63.6 kg)  08/18/21 146 lb (66.2 kg)  05/14/21 146 lb 9.6 oz (66.5 kg)    Physical Exam Constitutional:      General: She is not in acute distress.    Appearance: She is well-developed.  HENT:     Head:  Normocephalic.     Right Ear: External ear normal.     Left Ear: External ear normal.     Nose: Nose normal.  Eyes:     General:        Right eye: No discharge.        Left eye: No discharge.     Conjunctiva/sclera: Conjunctivae normal.     Pupils: Pupils are equal, round, and reactive to light.  Neck:     Thyroid: No thyromegaly.     Vascular: No JVD.     Trachea: No tracheal deviation.  Cardiovascular:     Rate and Rhythm: Normal rate and regular rhythm.     Heart sounds: Normal heart sounds.  Pulmonary:     Effort: No respiratory distress.     Breath sounds: No stridor. No wheezing.  Abdominal:     General: Bowel sounds are normal. There is no distension.     Palpations: Abdomen is soft. There is no mass.     Tenderness: There is no abdominal tenderness. There is no guarding or rebound.  Musculoskeletal:        General: No tenderness.     Cervical back: Normal range of motion and neck supple. No rigidity.  Lymphadenopathy:     Cervical: No cervical adenopathy.  Skin:    Findings: No erythema or rash.  Neurological:     Cranial Nerves: No cranial nerve deficit.     Motor: No abnormal muscle tone.     Coordination: Coordination normal.     Deep Tendon Reflexes: Reflexes normal.  Psychiatric:        Behavior: Behavior normal.        Thought Content: Thought content normal.        Judgment: Judgment normal.    Lab Results  Component Value Date   WBC 7.3 06/11/2021   HGB 10.9 (L) 06/11/2021   HCT 35.2 (L) 06/11/2021   PLT 222.0 06/11/2021   GLUCOSE 88 08/18/2021   CHOL 206 (H) 05/14/2021   TRIG 180.0 (H) 05/14/2021   HDL 46.50 05/14/2021   LDLDIRECT 162.8 06/28/2011   LDLCALC 123 (H) 05/14/2021   ALT 16 05/14/2021   AST 25 05/14/2021   NA 141 08/18/2021   K 3.6 08/18/2021   CL 104 08/18/2021   CREATININE 0.74 08/18/2021   BUN 15 08/18/2021   CO2 30 08/18/2021   TSH 0.88 05/14/2021   HGBA1C 5.5 01/28/2020    CT Abdomen Pelvis W Contrast  Result Date:  08/26/2021 CLINICAL DATA:  Chronic abdominal pain and nausea. Iron deficiency anemia. EXAM: CT ABDOMEN AND PELVIS WITH CONTRAST TECHNIQUE: Multidetector CT imaging of the abdomen and pelvis was performed using the standard protocol following bolus administration of intravenous contrast.  CONTRAST:  77mL OMNIPAQUE IOHEXOL 350 MG/ML SOLN COMPARISON:  No prior AP CT.  Chest CTA on 06/24/2016 FINDINGS: Lower Chest: No acute findings. Hepatobiliary: No hepatic masses identified. Mild hepatic steatosis is decreased since prior exam. Gallbladder is unremarkable. No evidence of biliary ductal dilatation. Pancreas:  No mass or inflammatory changes. Spleen: Within normal limits in size and appearance. Adrenals/Urinary Tract: 3.6 cm left adrenal mass and 1.5 cm right adrenal mass are both stable compared with prior chest CTA in 2017, consistent with benign adrenal adenomas. No masses identified. No evidence of ureteral calculi or hydronephrosis. Stomach/Bowel: Small hiatal hernia is seen. A soft tissue density is seen along the right lateral wall of the distal esophagus. This measures 2.2 x 1.2 cm and is stable to slightly decreased in size compared to previous study. No evidence of obstruction, inflammatory process or abnormal fluid collections. Vascular/Lymphatic: No pathologically enlarged lymph nodes. No acute vascular findings. Aortic atherosclerotic calcification noted. Reproductive: Prior hysterectomy noted. Adnexal regions are unremarkable in appearance. Other:  None. Musculoskeletal:  No suspicious bone lesions identified. IMPRESSION: No acute findings within the abdomen or pelvis. Hepatic steatosis. Stable benign bilateral adrenal adenomas. Stable small hiatal hernia, and adjacent soft tissue density along the right lateral wall of the hiatal hernia, consistent with benign etiology. Aortic Atherosclerosis (ICD10-I70.0). Electronically Signed   By: Marlaine Hind M.D.   On: 08/26/2021 13:20    Assessment & Plan:    Problem List Items Addressed This Visit     Adjustment disorder with mixed anxiety and depressed mood    Continue with as needed alprazolam      Carotid stenosis   Relevant Orders   US Carotid Bilateral   COPD, moderate (HCC)    Using Xopenex prn       Relevant Medications   loratadine (CLARITIN) 10 MG tablet   oxymetazoline (AFRIN 12 HOUR) 0.05 % nasal spray   pseudoephedrine (SUDAFED) 120 MG 12 hr tablet   Other Relevant Orders   Comprehensive metabolic panel   GERD    Increase Protonix to bid      Iron deficiency anemia - Primary    Check CBC, iron Cont PO Iron, consider IV       Relevant Orders   CBC with Differential/Platelet   Iron, TIBC and Ferritin Panel   Comprehensive metabolic panel   Major depressive disorder, recurrent episode, moderate (HCC)    Start Wellbutrin XL qd.   Continue with as needed alprazolam      Rhinitis    Sudafed  Claritin daily Sudafed tablets and Afrin nasal spray as needed for congestion      Other Visit Diagnoses     Needs flu shot       Relevant Orders   Flu Vaccine QUAD High Dose(Fluad) (Completed)         Meds ordered this encounter  Medications   DISCONTD: pseudoephedrine (SUDAFED) 120 MG 12 hr tablet    Sig: Take 1 tablet (120 mg total) by mouth every 12 (twelve) hours as needed for congestion.    Dispense:  60 tablet    Refill:  1   DISCONTD: oxymetazoline (AFRIN 12 HOUR) 0.05 % nasal spray    Sig: Place 1 spray into both nostrils 2 (two) times daily. No longer than 5 days    Dispense:  30 mL    Refill:  1   loratadine (CLARITIN) 10 MG tablet    Sig: Take 1 tablet (10 mg total) by mouth daily.  Dispense:  90 tablet    Refill:  3   oxymetazoline (AFRIN 12 HOUR) 0.05 % nasal spray    Sig: Place 1 spray into both nostrils 2 (two) times daily. No longer than 5 days    Dispense:  30 mL    Refill:  1   pseudoephedrine (SUDAFED) 120 MG 12 hr tablet    Sig: Take 1 tablet (120 mg total) by mouth every  12 (twelve) hours as needed for congestion.    Dispense:  60 tablet    Refill:  1      Follow-up: Return in about 3 months (around 01/11/2022) for a follow-up visit.  Walker Kehr, MD

## 2021-10-13 NOTE — Assessment & Plan Note (Addendum)
Check CBC, iron Cont PO Iron, consider IV

## 2021-10-13 NOTE — Patient Instructions (Addendum)
Sudafed 12 hour  tablets and Afrin nasal spray as needed for severe congestion  Choose iron-rich foods:  Red meat, pork and poultry. Seafood. Beans. Dark green leafy vegetables, such as spinach. Dried fruit, such as raisins and apricots. Iron-fortified cereals, breads and pastas. Peas.

## 2021-10-13 NOTE — Assessment & Plan Note (Signed)
Using Xopenex prn 

## 2021-11-08 ENCOUNTER — Encounter (HOSPITAL_COMMUNITY): Payer: Medicare HMO

## 2021-11-11 ENCOUNTER — Other Ambulatory Visit: Payer: Self-pay

## 2021-11-11 ENCOUNTER — Ambulatory Visit (HOSPITAL_COMMUNITY)
Admission: RE | Admit: 2021-11-11 | Discharge: 2021-11-11 | Disposition: A | Discharge status: Home / Self Care | Payer: Medicare HMO | Source: Ambulatory Visit | Attending: Internal Medicine | Admitting: Internal Medicine

## 2021-11-11 DIAGNOSIS — I6523 Occlusion and stenosis of bilateral carotid arteries: Secondary | ICD-10-CM | POA: Insufficient documentation

## 2021-12-15 DIAGNOSIS — L400 Psoriasis vulgaris: Secondary | ICD-10-CM | POA: Diagnosis not present

## 2021-12-16 ENCOUNTER — Other Ambulatory Visit: Payer: Self-pay | Admitting: Internal Medicine

## 2022-01-06 ENCOUNTER — Ambulatory Visit: Payer: Medicare HMO | Admitting: Internal Medicine

## 2022-01-11 ENCOUNTER — Telehealth: Payer: Self-pay | Admitting: Internal Medicine

## 2022-01-11 NOTE — Telephone Encounter (Signed)
1.Medication Requested: ?temazepam (RESTORIL) 15 MG capsule ?2. Pharmacy (Name, [place]): ?[facility] [place], [address] AT [address] Phone:  [phone]  ?Fax:  [phone]  ?  ? ?3. On Med List: y ? ?4. Last Visit with PCP: ? ?5. Next visit date with PCP: ? ? ?Agent: Please be advised that RX refills may take up to 3 business days. We ask that you follow-up with your pharmacy.  ?

## 2022-01-12 ENCOUNTER — Other Ambulatory Visit: Payer: Self-pay | Admitting: Internal Medicine

## 2022-01-13 MED ORDER — TEMAZEPAM 15 MG PO CAPS: ORAL_CAPSULE | ORAL | 5 refills | Status: DC

## 2022-01-13 NOTE — Telephone Encounter (Signed)
Done. Thanks.

## 2022-01-13 NOTE — Telephone Encounter (Signed)
LOV: 10/13/21 ?

## 2022-01-18 ENCOUNTER — Telehealth: Payer: Self-pay | Admitting: Internal Medicine

## 2022-01-18 NOTE — Telephone Encounter (Signed)
Return call to patient in regards to rescheduling her AWV. ?LM for patient to call me back. ?

## 2022-01-19 ENCOUNTER — Ambulatory Visit (INDEPENDENT_AMBULATORY_CARE_PROVIDER_SITE_OTHER): Payer: Medicare HMO

## 2022-01-19 ENCOUNTER — Encounter: Payer: Self-pay | Admitting: Internal Medicine

## 2022-01-19 ENCOUNTER — Ambulatory Visit (INDEPENDENT_AMBULATORY_CARE_PROVIDER_SITE_OTHER): Payer: Medicare HMO | Admitting: Internal Medicine

## 2022-01-19 DIAGNOSIS — R69 Illness, unspecified: Secondary | ICD-10-CM | POA: Diagnosis not present

## 2022-01-19 DIAGNOSIS — M129 Arthropathy, unspecified: Secondary | ICD-10-CM

## 2022-01-19 DIAGNOSIS — J449 Chronic obstructive pulmonary disease, unspecified: Secondary | ICD-10-CM

## 2022-01-19 DIAGNOSIS — R252 Cramp and spasm: Secondary | ICD-10-CM

## 2022-01-19 DIAGNOSIS — J309 Allergic rhinitis, unspecified: Secondary | ICD-10-CM

## 2022-01-19 DIAGNOSIS — R059 Cough, unspecified: Secondary | ICD-10-CM | POA: Diagnosis not present

## 2022-01-19 DIAGNOSIS — L409 Psoriasis, unspecified: Secondary | ICD-10-CM

## 2022-01-19 DIAGNOSIS — D509 Iron deficiency anemia, unspecified: Secondary | ICD-10-CM | POA: Diagnosis not present

## 2022-01-19 DIAGNOSIS — H1013 Acute atopic conjunctivitis, bilateral: Secondary | ICD-10-CM | POA: Diagnosis not present

## 2022-01-19 DIAGNOSIS — F331 Major depressive disorder, recurrent, moderate: Secondary | ICD-10-CM

## 2022-01-19 MED ORDER — KETOTIFEN FUMARATE 0.025 % OP SOLN: 2.0000 [drp] | Freq: Two times a day (BID) | OPHTHALMIC | 1 refills | Status: AC

## 2022-01-19 NOTE — Assessment & Plan Note (Addendum)
Per Dermatology - Dr Darrick Meigs ?

## 2022-01-19 NOTE — Patient Instructions (Addendum)
Use Zaditor or Patanol eye drops ? ?Blue-Emu cream --  use 2-3 times a day ? ?

## 2022-01-19 NOTE — Progress Notes (Signed)
? ?Subjective:  ?Patient ID: Theresa Aguirre, female    DOB: 1945-11-13  Age: 76 y.o. MRN: [id number] ? ?CC: No chief complaint on file. ? ? ?HPI ?Theresa Aguirre presents for anxiety ?C/o R eye tearing x 12 mo - Dr Gershon Crane next week ?F/u COPD ?C/o cough ? ? ?Outpatient Medications Prior to Visit  ?Medication Sig Dispense Refill  ? ALPRAZolam (XANAX) 1 MG tablet Take 0.5-1 tablets (0.5-1 mg total) by mouth 3 (three) times daily as needed for anxiety. 270 tablet 1  ? aspirin (BAYER ASPIRIN) 325 MG tablet Take 0.5 tablets (162 mg total) by mouth daily. 100 tablet 3  ? b complex vitamins tablet Take 1 tablet by mouth daily. 100 tablet 3  ? buPROPion (WELLBUTRIN XL) 150 MG 24 hr tablet TAKE 1 TABLET(150 MG) BY MOUTH IN THE MORNING 30 tablet 5  ? clobetasol cream (TEMOVATE) 0.25 % Apply 1 application topically 2 (two) times daily. 60 g 3  ? cloNIDine (CATAPRES) 0.1 MG tablet Take 1 tablet (0.1 mg total) by mouth 3 (three) times daily as needed (take if top blood pressure is over 180). 90 tablet 3  ? ferrous sulfate (FEROSUL) 325 (65 FE) MG tablet TAKE 1 TABLET(325 MG) BY MOUTH DAILY 30 tablet 5  ? furosemide (LASIX) 20 MG tablet Take 1 tablet (20 mg total) by mouth 2 (two) times daily as needed for edema. 90 tablet 1  ? hydrochlorothiazide (MICROZIDE) 12.5 MG capsule TAKE 1 CAPSULE(12.5 MG) BY MOUTH DAILY 90 capsule 3  ? levalbuterol (XOPENEX HFA) 45 MCG/ACT inhaler INHALE 2 PUFFS INTO THE LUNGS EVERY 6 HOURS AS NEEDED FOR WHEEZING OR SHORTNESS OF BREATH 45 g 3  ? lipase/protease/amylase (CREON) 36000 UNITS CPEP capsule Take 1 capsule (36,000 Units total) by mouth 3 (three) times daily before meals. 90 capsule 11  ? loratadine (CLARITIN) 10 MG tablet Take 1 tablet (10 mg total) by mouth daily. 90 tablet 3  ? oxymetazoline (AFRIN 12 HOUR) 0.05 % nasal spray Place 1 spray into both nostrils 2 (two) times daily. No longer than 5 days 30 mL 1  ? pantoprazole (PROTONIX) 40 MG tablet Take 1 tablet (40 mg total) by mouth 2 (two)  times daily. 60 tablet 3  ? potassium chloride (K-DUR) 10 MEQ tablet Take qd prn with Lasix 90 tablet 1  ? pseudoephedrine (SUDAFED) 120 MG 12 hr tablet Take 1 tablet (120 mg total) by mouth every 12 (twelve) hours as needed for congestion. 60 tablet 1  ? roflumilast (DALIRESP) 500 MCG TABS tablet Take 1 tablet (500 mcg total) by mouth daily. 90 tablet 3  ? Simethicone (GAS-X PO) Take by mouth.    ? temazepam (RESTORIL) 15 MG capsule 1-2 qhs prn 60 capsule 5  ? Tiotropium Bromide Monohydrate (SPIRIVA RESPIMAT) 2.5 MCG/ACT AERS Inhale 2 puffs into the lungs daily. 4 g 11  ? ?No facility-administered medications prior to visit.  ? ? ?ROS: ?Review of Systems  ?Constitutional:  Negative for activity change, appetite change, chills, fatigue and unexpected weight change.  ?HENT:  Negative for congestion, mouth sores and sinus pressure.   ?Eyes:  Positive for itching. Negative for visual disturbance.  ?Respiratory:  Positive for cough. Negative for chest tightness.   ?Cardiovascular:  Negative for chest pain.  ?Gastrointestinal:  Negative for abdominal pain and nausea.  ?Genitourinary:  Negative for difficulty urinating, frequency and vaginal pain.  ?Musculoskeletal:  Positive for arthralgias. Negative for back pain and gait problem.  ?Skin:  Negative for pallor and rash.  ?  Neurological:  Negative for dizziness, tremors, weakness, numbness and headaches.  ?Psychiatric/Behavioral:  Positive for dysphoric mood. Negative for confusion, sleep disturbance and suicidal ideas. The patient is nervous/anxious.   ? ?Objective:  ?BP 120/70 (BP Location: Left Arm, Patient Position: Sitting, Cuff Size: Large)   Pulse 67   Temp 97.9 ?F (36.6 ?C) (Oral)   Ht '5\' 1"'$  (1.549 m)   Wt 139 lb (63 kg)   SpO2 95%   BMI 26.26 kg/m?  ? ?BP Readings from Last 3 Encounters:  ?01/19/22 120/70  ?10/13/21 (!) 146/78  ?08/18/21 (!) 146/70  ? ? ?Wt Readings from Last 3 Encounters:  ?01/19/22 139 lb (63 kg)  ?10/13/21 140 lb 3.2 oz (63.6 kg)   ?08/18/21 146 lb (66.2 kg)  ? ? ?Physical Exam ?Constitutional:   ?   General: She is not in acute distress. ?   Appearance: She is well-developed.  ?HENT:  ?   Head: Normocephalic.  ?   Right Ear: External ear normal.  ?   Left Ear: External ear normal.  ?   Nose: Nose normal.  ?Eyes:  ?   General:     ?   Right eye: No discharge.     ?   Left eye: No discharge.  ?   Conjunctiva/sclera: Conjunctivae normal.  ?   Pupils: Pupils are equal, round, and reactive to light.  ?Neck:  ?   Thyroid: No thyromegaly.  ?   Vascular: No JVD.  ?   Trachea: No tracheal deviation.  ?Cardiovascular:  ?   Rate and Rhythm: Normal rate and regular rhythm.  ?   Heart sounds: Normal heart sounds.  ?Pulmonary:  ?   Effort: No respiratory distress.  ?   Breath sounds: No stridor. No wheezing.  ?Abdominal:  ?   General: Bowel sounds are normal. There is no distension.  ?   Palpations: Abdomen is soft. There is no mass.  ?   Tenderness: There is no abdominal tenderness. There is no guarding or rebound.  ?Musculoskeletal:     ?   General: No tenderness.  ?   Cervical back: Normal range of motion and neck supple. No rigidity.  ?Lymphadenopathy:  ?   Cervical: No cervical adenopathy.  ?Skin: ?   Findings: No erythema or rash.  ?Neurological:  ?   Mental Status: She is oriented to person, place, and time.  ?   Cranial Nerves: No cranial nerve deficit.  ?   Motor: No abnormal muscle tone.  ?   Coordination: Coordination normal.  ?   Deep Tendon Reflexes: Reflexes normal.  ?Psychiatric:     ?   Behavior: Behavior normal.     ?   Thought Content: Thought content normal.     ?   Judgment: Judgment normal.  ?Dry skin ? ?Lab Results  ?Component Value Date  ? WBC 7.3 06/11/2021  ? HGB 10.9 (L) 06/11/2021  ? HCT 35.2 (L) 06/11/2021  ? PLT 222.0 06/11/2021  ? GLUCOSE 88 08/18/2021  ? CHOL 206 (H) 05/14/2021  ? TRIG 180.0 (H) 05/14/2021  ? HDL 46.50 05/14/2021  ? LDLDIRECT 162.8 06/28/2011  ? LDLCALC 123 (H) 05/14/2021  ? ALT 16 05/14/2021  ? AST 25  05/14/2021  ? NA 141 08/18/2021  ? K 3.6 08/18/2021  ? CL 104 08/18/2021  ? CREATININE 0.74 08/18/2021  ? BUN 15 08/18/2021  ? CO2 30 08/18/2021  ? TSH 0.88 05/14/2021  ? HGBA1C 5.5 01/28/2020  ? ? ?VAS US  CAROTID ? ?Result Date: 11/12/2021 ?Carotid Arterial Duplex Study Patient Name:  CRIS GIBBY  Date of Exam:   11/11/2021 Medical Rec #: [id number]       Accession #:    [id number] Date of Birth: 16-Apr-1946       Patient Gender: F Patient Age:   58 years Exam Location:  [facility] Procedure:      VAS US CAROTID Referring Phys: Tyrone Apple Gustavus Haskin --------------------------------------------------------------------------------  Indications:       Carotid artery disease. Risk Factors:      Hypertension, hyperlipidemia, current smoker. Comparison Study:  Prior carotid duplex performed on 08/29/2020 suggested 40-59%                    RICA stenosis and 5-10% LICA stenosis. Performing Technologist: Delorise Shiner RVT  Examination Guidelines: A complete evaluation includes B-mode imaging, spectral Doppler, color Doppler, and power Doppler as needed of all accessible portions of each Aguirre. Bilateral testing is considered an integral part of a complete examination. Limited examinations for reoccurring indications may be performed as noted.  Right Carotid Findings: +----------+--------+--------+--------+----------------------+--------+           PSV cm/sEDV cm/sStenosisPlaque Description    Comments +----------+--------+--------+--------+----------------------+--------+ CCA Prox  125     19                                             +----------+--------+--------+--------+----------------------+--------+ CCA Mid   89      17                                             +----------+--------+--------+--------+----------------------+--------+ CCA Distal92      24                                             +----------+--------+--------+--------+----------------------+--------+ ICA  Prox  166     53      40-59%  calcific and irregular         +----------+--------+--------+--------+----------------------+--------+ ICA Mid   142     30                                             +----------+--------+--------+--------+--------------

## 2022-01-19 NOTE — Assessment & Plan Note (Signed)
Try Blue-Emu cream --  use 2-3 times a day ?

## 2022-01-19 NOTE — Assessment & Plan Note (Signed)
Use Zaditor or Patanol eye drops ?

## 2022-01-19 NOTE — Assessment & Plan Note (Signed)
Cont on Wellbutrin XL qd.  Continue with as needed alprazolam ?

## 2022-01-19 NOTE — Assessment & Plan Note (Addendum)
On Daliresp - doing well ?Xopenex prn, Spiriva Respimat ?Not smoking or smoking very little when stressed ?Using Xopenex prn  ? ?C/o cough - check CXR ?

## 2022-01-20 ENCOUNTER — Ambulatory Visit: Payer: Medicare HMO

## 2022-01-20 LAB — CBC WITH DIFFERENTIAL/PLATELET
Basophils Absolute: 0.1 10*3/uL (ref 0.0–0.1)
Basophils Relative: 1.6 % (ref 0.0–3.0)
Eosinophils Absolute: 0.2 10*3/uL (ref 0.0–0.7)
Eosinophils Relative: 2.9 % (ref 0.0–5.0)
HCT: 42.7 % (ref 36.0–46.0)
Hemoglobin: 14.1 g/dL (ref 12.0–15.0)
Lymphocytes Relative: 33.7 % (ref 12.0–46.0)
Lymphs Abs: 2.6 10*3/uL (ref 0.7–4.0)
MCHC: 32.9 g/dL (ref 30.0–36.0)
MCV: 82.1 fl (ref 78.0–100.0)
Monocytes Absolute: 0.7 10*3/uL (ref 0.1–1.0)
Monocytes Relative: 9.3 % (ref 3.0–12.0)
Neutro Abs: 4.1 10*3/uL (ref 1.4–7.7)
Neutrophils Relative %: 52.5 % (ref 43.0–77.0)
Platelets: 248 10*3/uL (ref 150.0–400.0)
RBC: 5.21 Mil/uL — ABNORMAL HIGH (ref 3.87–5.11)
RDW: 13.9 % (ref 11.5–15.5)
WBC: 7.8 10*3/uL (ref 4.0–10.5)

## 2022-01-20 LAB — COMPREHENSIVE METABOLIC PANEL
ALT: 14 U/L (ref 0–35)
AST: 21 U/L (ref 0–37)
Albumin: 4.7 g/dL (ref 3.5–5.2)
Alkaline Phosphatase: 64 U/L (ref 39–117)
BUN: 12 mg/dL (ref 6–23)
CO2: 31 mEq/L (ref 19–32)
Calcium: 9.9 mg/dL (ref 8.4–10.5)
Chloride: 98 mEq/L (ref 96–112)
Creatinine, Ser: 0.86 mg/dL (ref 0.40–1.20)
GFR: 65.62 mL/min (ref >60.00)
Glucose, Bld: 91 mg/dL (ref 70–99)
Potassium: 3.5 mEq/L (ref 3.5–5.1)
Sodium: 139 mEq/L (ref 135–145)
Total Bilirubin: 0.7 mg/dL (ref 0.2–1.2)
Total Protein: 7.7 g/dL (ref 6.0–8.3)

## 2022-01-20 LAB — IRON,TIBC AND FERRITIN PANEL
%SAT: 19 % (calc) (ref 16–45)
Ferritin: 12 ng/mL — ABNORMAL LOW (ref 16–288)
Iron: 74 ug/dL (ref 45–160)
TIBC: 394 mcg/dL (calc) (ref 250–450)

## 2022-01-25 ENCOUNTER — Other Ambulatory Visit: Payer: Self-pay | Admitting: Internal Medicine

## 2022-02-16 DIAGNOSIS — L814 Other melanin hyperpigmentation: Secondary | ICD-10-CM | POA: Diagnosis not present

## 2022-02-16 DIAGNOSIS — L400 Psoriasis vulgaris: Secondary | ICD-10-CM | POA: Diagnosis not present

## 2022-02-16 DIAGNOSIS — D1801 Hemangioma of skin and subcutaneous tissue: Secondary | ICD-10-CM | POA: Diagnosis not present

## 2022-02-16 DIAGNOSIS — D3617 Benign neoplasm of peripheral nerves and autonomic nervous system of trunk, unspecified: Secondary | ICD-10-CM | POA: Diagnosis not present

## 2022-03-01 DIAGNOSIS — H04551 Acquired stenosis of right nasolacrimal duct: Secondary | ICD-10-CM | POA: Diagnosis not present

## 2022-03-01 DIAGNOSIS — H2513 Age-related nuclear cataract, bilateral: Secondary | ICD-10-CM | POA: Diagnosis not present

## 2022-03-01 DIAGNOSIS — H524 Presbyopia: Secondary | ICD-10-CM | POA: Diagnosis not present

## 2022-03-01 DIAGNOSIS — H5203 Hypermetropia, bilateral: Secondary | ICD-10-CM | POA: Diagnosis not present

## 2022-04-07 DIAGNOSIS — H6122 Impacted cerumen, left ear: Secondary | ICD-10-CM | POA: Insufficient documentation

## 2022-04-07 DIAGNOSIS — J300 Vasomotor rhinitis: Secondary | ICD-10-CM | POA: Diagnosis not present

## 2022-04-07 DIAGNOSIS — H608X2 Other otitis externa, left ear: Secondary | ICD-10-CM | POA: Diagnosis not present

## 2022-04-13 NOTE — Progress Notes (Addendum)
This encounter was created in error - please disregard.

## 2022-04-18 IMAGING — CT CT ABD-PELV W/ CM
2 of 5 series · 16 of 46 positions shown, 18 images · IV contrast (OMNIPAQUE 300)
Comparison: No prior AP CT.  Chest CTA on 06/24/2016

CLINICAL DATA: Chronic abdominal pain and nausea. Iron deficiency
anemia.

EXAM:
CT ABDOMEN AND PELVIS WITH CONTRAST
TECHNIQUE: Multidetector CT imaging of the abdomen and pelvis was performed
using the standard protocol following bolus administration of
intravenous contrast.
CONTRAST:  80mL OMNIPAQUE IOHEXOL 350 MG/ML SOLN

[Series 2: abd/pel w · axial · 0.73mm/px · z∈[-446,-56]mm · 13 of 88 slices shown, 15 images]
[im 5/88  soft-tissue]
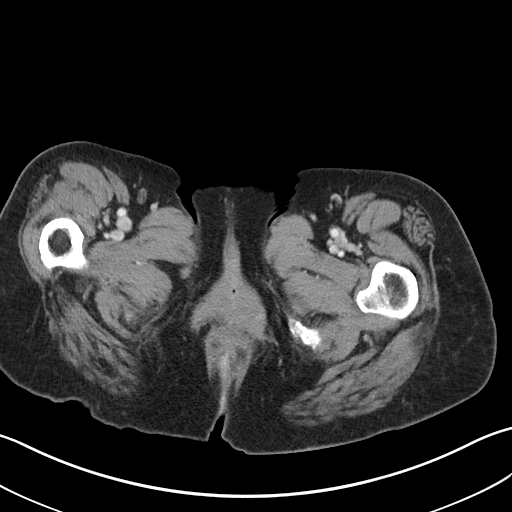
[im 5/88  bone]
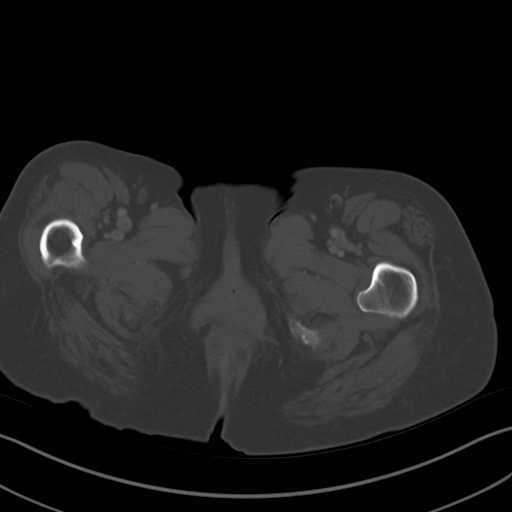
[im 10/88  soft-tissue]
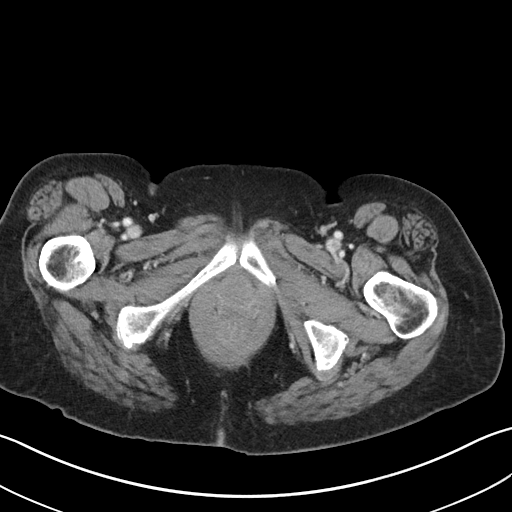
[im 20/88  soft-tissue]
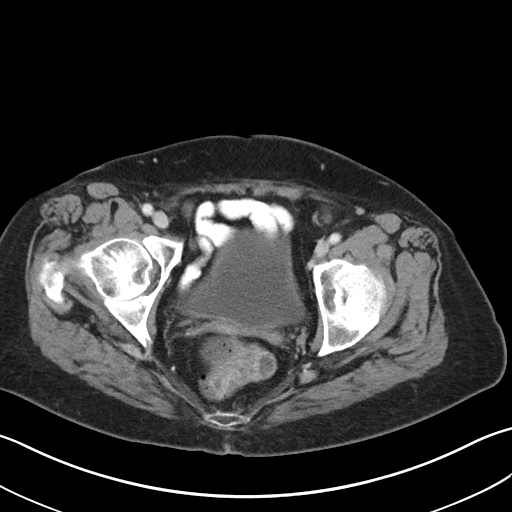
[im 25/88  soft-tissue]
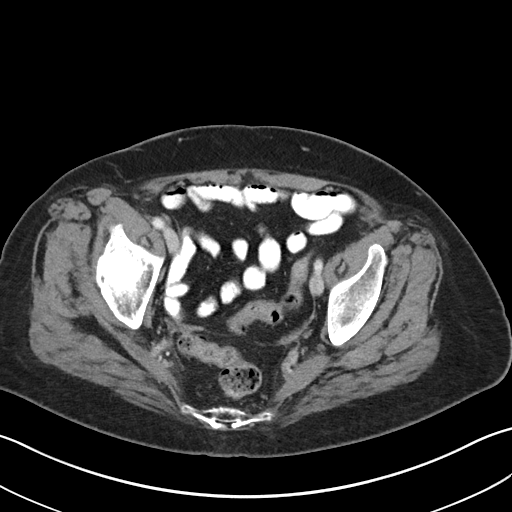
[im 30/88  soft-tissue]
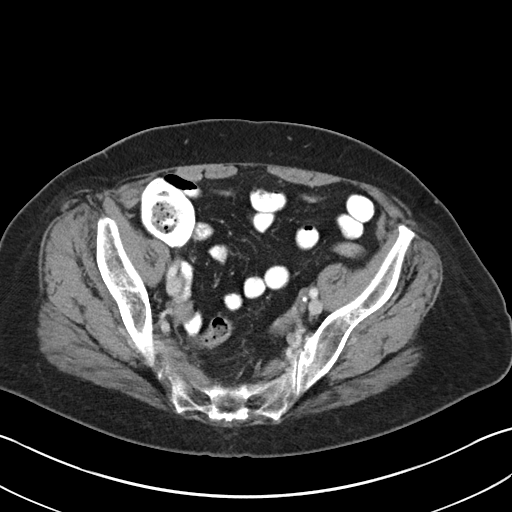
[im 39/88  soft-tissue]
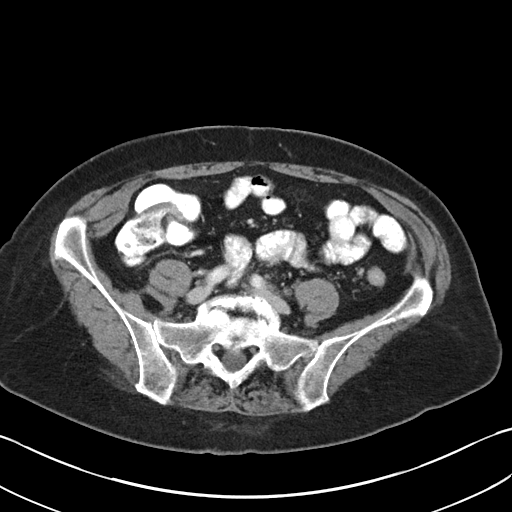
[im 44/88  soft-tissue]
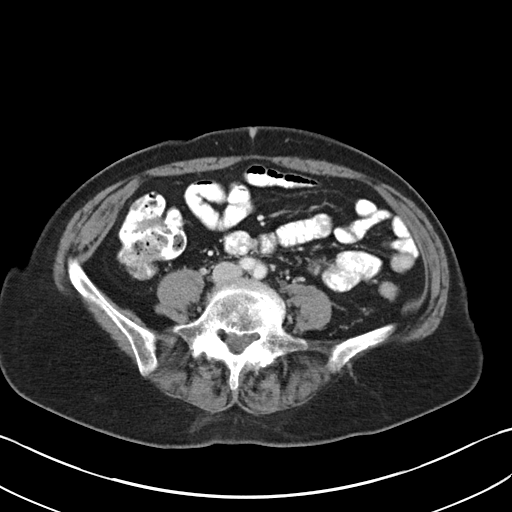
[im 49/88  soft-tissue]
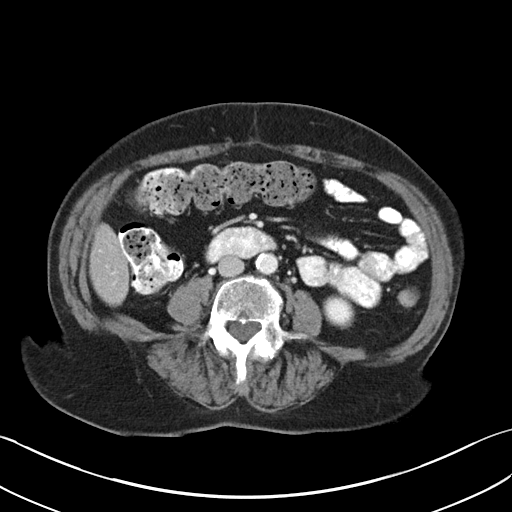
[im 59/88  soft-tissue]
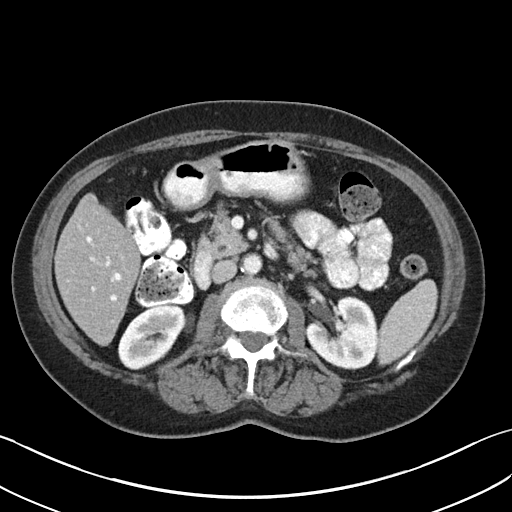
[im 59/88  bone]
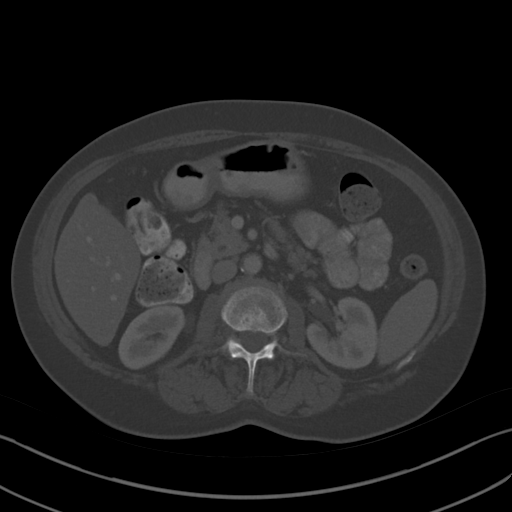
[im 63/88  soft-tissue]
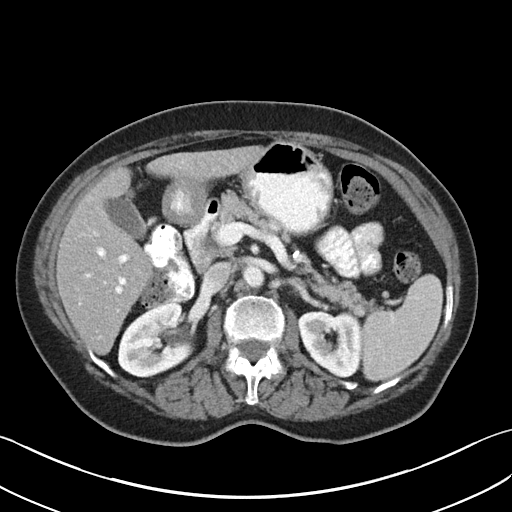
[im 68/88  soft-tissue]
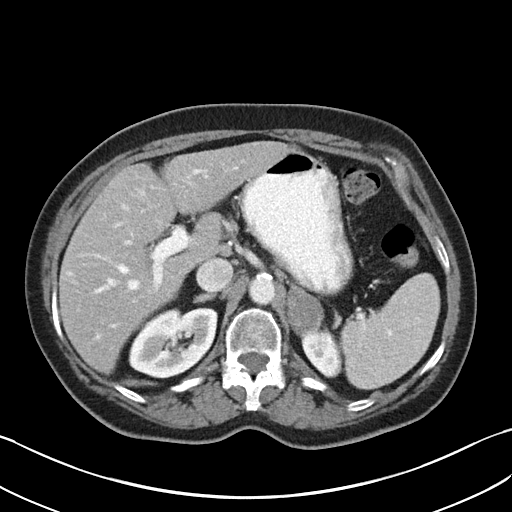
[im 78/88  soft-tissue]
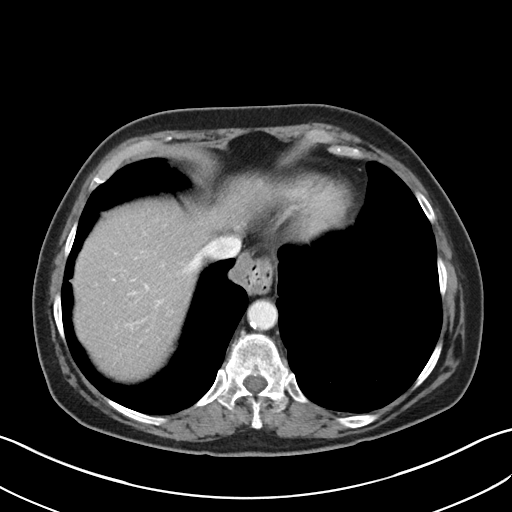
[im 83/88  soft-tissue]
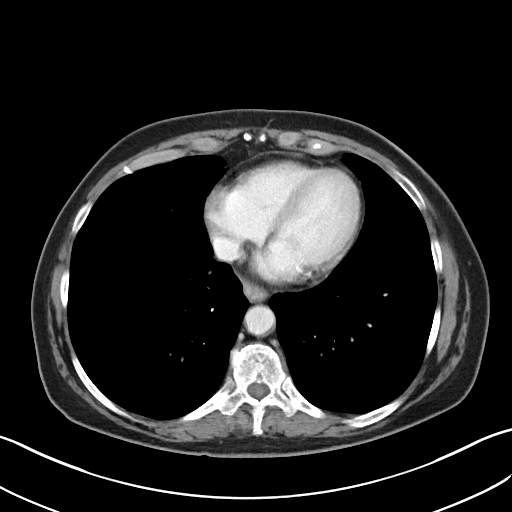

[Series 5: coronal st · coronal · 0.69mm/px · 3 of 79 slices shown]
[im 27/79  soft-tissue]
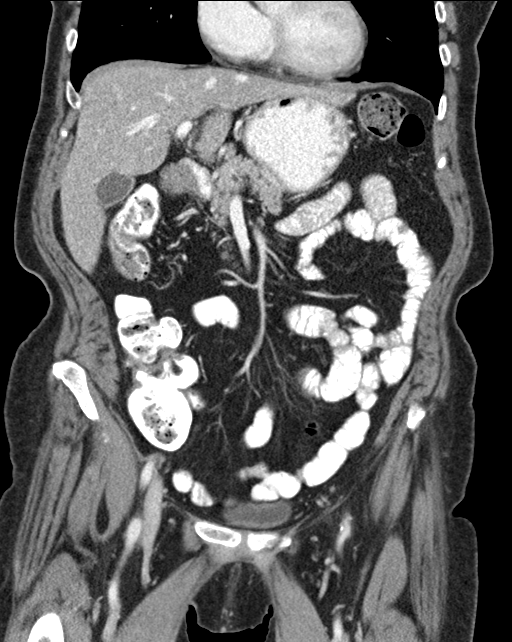
[im 35/79  soft-tissue]
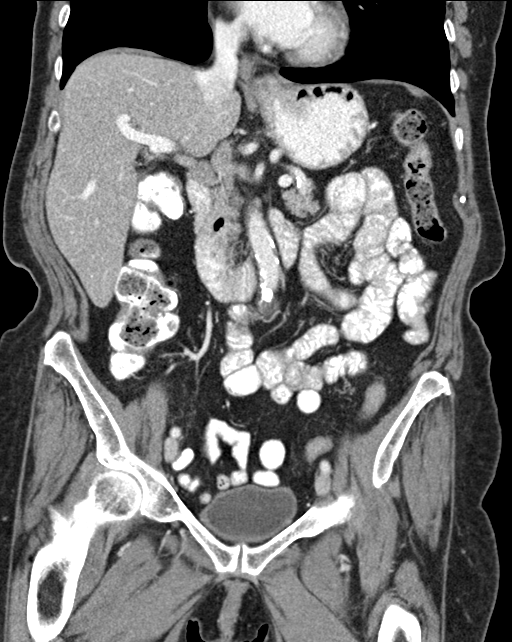
[im 44/79  soft-tissue]
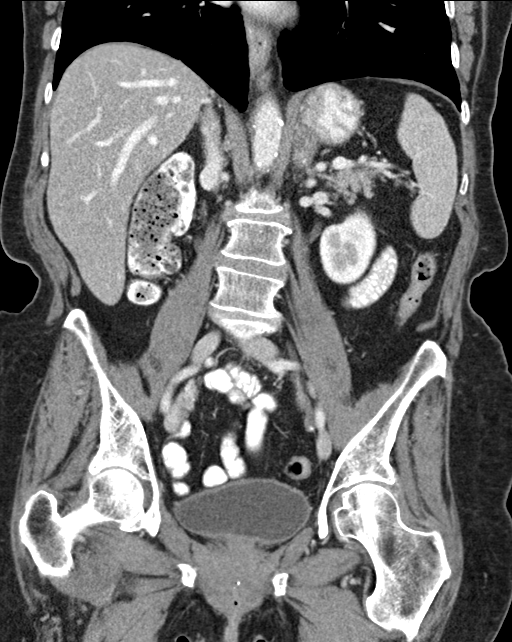

[16 of 46 positions shown; findings below may reference images not displayed]

FINDINGS: Lower Chest: No acute findings.

Hepatobiliary: No hepatic masses identified. Mild hepatic steatosis
is decreased since prior exam. Gallbladder is unremarkable. No
evidence of biliary ductal dilatation.

Pancreas:  No mass or inflammatory changes.

Spleen: Within normal limits in size and appearance.

Adrenals/Urinary Tract: 3.6 cm left adrenal mass and 1.5 cm right
adrenal mass are both stable compared with prior chest CTA in 8986,
consistent with benign adrenal adenomas. No masses identified. No
evidence of ureteral calculi or hydronephrosis.

Stomach/Bowel: Small hiatal hernia is seen. A soft tissue density is
seen along the right lateral wall of the distal esophagus. This
measures 2.2 x 1.2 cm and is stable to slightly decreased in size
compared to previous study. No evidence of obstruction, inflammatory
process or abnormal fluid collections.

Vascular/Lymphatic: No pathologically enlarged lymph nodes. No acute
vascular findings. Aortic atherosclerotic calcification noted.

Reproductive: Prior hysterectomy noted. Adnexal regions are
unremarkable in appearance.

Other:  None.

Musculoskeletal:  No suspicious bone lesions identified.
IMPRESSION: No acute findings within the abdomen or pelvis.

Hepatic steatosis.

Stable benign bilateral adrenal adenomas.

Stable small hiatal hernia, and adjacent soft tissue density along
the right lateral wall of the hiatal hernia, consistent with benign
etiology.

Aortic Atherosclerosis ([id number]).

## 2022-06-01 ENCOUNTER — Telehealth: Payer: Self-pay

## 2022-06-01 NOTE — Telephone Encounter (Signed)
Last OV 01/19/22 notes: Return in about 3 months (around 04/20/2022) for a follow-up visit.  Pt notified of above & appt scheduled at pt earliest convenience.

## 2022-06-09 DIAGNOSIS — H04223 Epiphora due to insufficient drainage, bilateral lacrimal glands: Secondary | ICD-10-CM | POA: Diagnosis not present

## 2022-06-09 DIAGNOSIS — H02135 Senile ectropion of left lower eyelid: Secondary | ICD-10-CM | POA: Diagnosis not present

## 2022-06-09 DIAGNOSIS — H04563 Stenosis of bilateral lacrimal punctum: Secondary | ICD-10-CM | POA: Diagnosis not present

## 2022-06-09 DIAGNOSIS — H0279 Other degenerative disorders of eyelid and periocular area: Secondary | ICD-10-CM | POA: Diagnosis not present

## 2022-06-09 DIAGNOSIS — H04553 Acquired stenosis of bilateral nasolacrimal duct: Secondary | ICD-10-CM | POA: Diagnosis not present

## 2022-06-09 DIAGNOSIS — H02132 Senile ectropion of right lower eyelid: Secondary | ICD-10-CM | POA: Diagnosis not present

## 2022-06-10 ENCOUNTER — Encounter: Payer: Self-pay | Admitting: Internal Medicine

## 2022-06-10 ENCOUNTER — Ambulatory Visit (INDEPENDENT_AMBULATORY_CARE_PROVIDER_SITE_OTHER): Payer: Medicare HMO | Admitting: Internal Medicine

## 2022-06-10 VITALS — BP 152/88 | HR 85 | Temp 98.0°F | Ht 61.0 in | Wt 144.0 lb

## 2022-06-10 DIAGNOSIS — D509 Iron deficiency anemia, unspecified: Secondary | ICD-10-CM

## 2022-06-10 DIAGNOSIS — F331 Major depressive disorder, recurrent, moderate: Secondary | ICD-10-CM | POA: Diagnosis not present

## 2022-06-10 DIAGNOSIS — R69 Illness, unspecified: Secondary | ICD-10-CM | POA: Diagnosis not present

## 2022-06-10 DIAGNOSIS — J449 Chronic obstructive pulmonary disease, unspecified: Secondary | ICD-10-CM

## 2022-06-10 DIAGNOSIS — M129 Arthropathy, unspecified: Secondary | ICD-10-CM

## 2022-06-10 DIAGNOSIS — F4323 Adjustment disorder with mixed anxiety and depressed mood: Secondary | ICD-10-CM

## 2022-06-10 LAB — CBC WITH DIFFERENTIAL/PLATELET
Basophils Absolute: 0.1 10*3/uL (ref 0.0–0.1)
Basophils Relative: 1.2 % (ref 0.0–3.0)
Eosinophils Absolute: 0.5 10*3/uL (ref 0.0–0.7)
Eosinophils Relative: 7.3 % — ABNORMAL HIGH (ref 0.0–5.0)
HCT: 37.7 % (ref 36.0–46.0)
Hemoglobin: 12 g/dL (ref 12.0–15.0)
Lymphocytes Relative: 35.2 % (ref 12.0–46.0)
Lymphs Abs: 2.2 10*3/uL (ref 0.7–4.0)
MCHC: 31.8 g/dL (ref 30.0–36.0)
MCV: 77 fl — ABNORMAL LOW (ref 78.0–100.0)
Monocytes Absolute: 0.6 10*3/uL (ref 0.1–1.0)
Monocytes Relative: 9.5 % (ref 3.0–12.0)
Neutro Abs: 2.9 10*3/uL (ref 1.4–7.7)
Neutrophils Relative %: 46.8 % (ref 43.0–77.0)
Platelets: 208 10*3/uL (ref 150.0–400.0)
RBC: 4.9 Mil/uL (ref 3.87–5.11)
RDW: 14.5 % (ref 11.5–15.5)
WBC: 6.2 10*3/uL (ref 4.0–10.5)

## 2022-06-10 LAB — COMPREHENSIVE METABOLIC PANEL
ALT: 16 U/L (ref 0–35)
AST: 21 U/L (ref 0–37)
Albumin: 4.2 g/dL (ref 3.5–5.2)
Alkaline Phosphatase: 68 U/L (ref 39–117)
BUN: 14 mg/dL (ref 6–23)
CO2: 32 mEq/L (ref 19–32)
Calcium: 9.8 mg/dL (ref 8.4–10.5)
Chloride: 103 mEq/L (ref 96–112)
Creatinine, Ser: 0.74 mg/dL (ref 0.40–1.20)
GFR: 78.37 mL/min (ref >60.00)
Glucose, Bld: 86 mg/dL (ref 70–99)
Potassium: 4.1 mEq/L (ref 3.5–5.1)
Sodium: 141 mEq/L (ref 135–145)
Total Bilirubin: 0.4 mg/dL (ref 0.2–1.2)
Total Protein: 7.2 g/dL (ref 6.0–8.3)

## 2022-06-10 LAB — TSH: TSH: 1.15 u[IU]/mL (ref 0.35–5.50)

## 2022-06-10 NOTE — Assessment & Plan Note (Signed)
Blue-Emu cream -- use 2-3 times a day ? ?

## 2022-06-10 NOTE — Progress Notes (Signed)
Subjective:  Patient ID: Theresa Aguirre, female    DOB: 17-Nov-1945  Age: 76 y.o. MRN: [id number]  CC: [id number]   HPI Theresa Aguirre for anxiety, COPD, GERD, anemia  Outpatient Medications Prior to Visit  Medication Sig Dispense Refill   ALPRAZolam (XANAX) 1 MG tablet Take 0.5-1 tablets (0.5-1 mg total) by mouth 3 (three) times daily as needed for anxiety. 270 tablet 1   aspirin (BAYER ASPIRIN) 325 MG tablet Take 0.5 tablets (162 mg total) by mouth daily. 100 tablet 3   b complex vitamins tablet Take 1 tablet by mouth daily. 100 tablet 3   buPROPion (WELLBUTRIN XL) 150 MG 24 hr tablet TAKE 1 TABLET(150 MG) BY MOUTH IN THE MORNING 30 tablet 5   clobetasol cream (TEMOVATE) 07.61% Apply 1 application topically 2 (two) times daily. 60 g 3   cloNIDine (CATAPRES) 0.1 MG tablet Take 1 tablet (0.1 mg total) by mouth 3 (three) times daily as needed (take if top blood pressure is over 180). 90 tablet 3   ferrous sulfate (FEROSUL) 325 (65 FE) MG tablet TAKE 1 TABLET(325 MG) BY MOUTH DAILY 30 tablet 5   furosemide (LASIX) 20 MG tablet Take 1 tablet (20 mg total) by mouth 2 (two) times daily as needed for edema. 90 tablet 1   hydrochlorothiazide (MICROZIDE) 12.5 MG capsule TAKE 1 CAPSULE(12.5 MG) BY MOUTH DAILY 90 capsule 3   ketotifen (ZADITOR) 0.025 % ophthalmic solution Place 2 drops into both eyes 2 (two) times daily. 5 mL 1   levalbuterol (XOPENEX HFA) 45 MCG/ACT inhaler INHALE 2 PUFFS INTO THE LUNGS EVERY 6 HOURS AS NEEDED FOR WHEEZING OR SHORTNESS OF BREATH 45 g 3   lipase/protease/amylase (CREON) 36000 UNITS CPEP capsule Take 1 capsule (36,000 Units total) by mouth 3 (three) times daily before meals. 90 capsule 11   loratadine (CLARITIN) 10 MG tablet Take 1 tablet (10 mg total) by mouth daily. 90 tablet 3   oxymetazoline (AFRIN 12 HOUR) 0.05 % nasal spray Place 1 spray into both nostrils 2 (two) times daily. No longer than 5 days 30 mL 1   pantoprazole (PROTONIX) 40 MG tablet Take  1 tablet (40 mg total) by mouth 2 (two) times daily. 60 tablet 3   potassium chloride (K-DUR) 10 MEQ tablet Take qd prn with Lasix 90 tablet 1   pseudoephedrine (SUDAFED) 120 MG 12 hr tablet Take 1 tablet (120 mg total) by mouth every 12 (twelve) hours as needed for congestion. 60 tablet 1   roflumilast (DALIRESP) 500 MCG TABS tablet Take 1 tablet (500 mcg total) by mouth daily. Annual appt due in August must see provider for future refills 90 tablet 1   Simethicone (GAS-X PO) Take by mouth.     temazepam (RESTORIL) 15 MG capsule 1-2 qhs prn 60 capsule 5   Tiotropium Bromide Monohydrate (SPIRIVA RESPIMAT) 2.5 MCG/ACT AERS Inhale 2 puffs into the lungs daily. 4 g 11   No facility-administered medications prior to visit.    ROS: Review of Systems  Constitutional:  Negative for activity change, appetite change, chills, fatigue and unexpected weight change.  HENT:  Negative for congestion, mouth sores and sinus pressure.   Eyes:  Negative for visual disturbance.  Respiratory:  Negative for cough and chest tightness.   Gastrointestinal:  Negative for abdominal pain and nausea.  Genitourinary:  Negative for difficulty urinating, frequency and vaginal pain.  Musculoskeletal:  Positive for arthralgias and back pain. Negative for gait problem.  Skin:  Negative  for pallor and rash.  Neurological:  Negative for dizziness, tremors, weakness, numbness and headaches.  Psychiatric/Behavioral:  Negative for confusion, sleep disturbance and suicidal ideas. The patient is nervous/anxious.     Objective:  BP (!) 152/88 (BP Location: Left Arm, Patient Position: Sitting, Cuff Size: Normal)   Pulse 85   Temp 98 F (36.7 C) (Other (Comment))   Ht '5\' 1"'$  (1.549 m)   Wt 144 lb (65.3 kg)   SpO2 94%   BMI 27.21 kg/m   BP Readings from Last 3 Encounters:  06/10/22 (!) 152/88  01/19/22 120/70  10/13/21 (!) 146/78    Wt Readings from Last 3 Encounters:  06/10/22 144 lb (65.3 kg)  01/19/22 139 lb (63 kg)   10/13/21 140 lb 3.2 oz (63.6 kg)    Physical Exam Constitutional:      General: She is not in acute distress.    Appearance: Normal appearance. She is well-developed. She is obese.  HENT:     Head: Normocephalic.     Right Ear: External ear normal.     Left Ear: External ear normal.     Nose: Nose normal.  Eyes:     General:        Right eye: No discharge.        Left eye: No discharge.     Conjunctiva/sclera: Conjunctivae normal.     Pupils: Pupils are equal, round, and reactive to light.  Neck:     Thyroid: No thyromegaly.     Vascular: No JVD.     Trachea: No tracheal deviation.  Cardiovascular:     Rate and Rhythm: Normal rate and regular rhythm.     Heart sounds: Normal heart sounds.  Pulmonary:     Effort: No respiratory distress.     Breath sounds: No stridor. No wheezing.  Abdominal:     General: Bowel sounds are normal. There is no distension.     Palpations: Abdomen is soft. There is no mass.     Tenderness: There is no abdominal tenderness. There is no guarding or rebound.  Musculoskeletal:        General: Tenderness present.     Cervical back: Normal range of motion and neck supple. No rigidity.  Lymphadenopathy:     Cervical: No cervical adenopathy.  Skin:    Findings: No erythema or rash.  Neurological:     Mental Status: She is oriented to person, place, and time.     Cranial Nerves: No cranial nerve deficit.     Motor: No abnormal muscle tone.     Coordination: Coordination normal.     Deep Tendon Reflexes: Reflexes normal.  Psychiatric:        Behavior: Behavior normal.        Thought Content: Thought content normal.        Judgment: Judgment normal.     Lab Results  Component Value Date   WBC 7.8 01/19/2022   HGB 14.1 01/19/2022   HCT 42.7 01/19/2022   PLT 248.0 01/19/2022   GLUCOSE 91 01/19/2022   CHOL 206 (H) 05/14/2021   TRIG 180.0 (H) 05/14/2021   HDL 46.50 05/14/2021   LDLDIRECT 162.8 06/28/2011   LDLCALC 123 (H) 05/14/2021    ALT 14 01/19/2022   AST 21 01/19/2022   NA 139 01/19/2022   K 3.5 01/19/2022   CL 98 01/19/2022   CREATININE 0.86 01/19/2022   BUN 12 01/19/2022   CO2 31 01/19/2022   TSH 0.88 05/14/2021   HGBA1C  5.5 01/28/2020    VAS US CAROTID  Result Date: 11/12/2021 Carotid Arterial Duplex Study Patient Name:  Theresa Aguirre  Date of Exam:   11/11/2021 Medical Rec #: [id number]       Accession #:    [id number] Date of Birth: [birth year]       Patient Gender: F Patient Age:   31 years Exam Location:  [facility] Procedure:      VAS US CAROTID Referring Phys: Tyrone Apple Vinette Crites --------------------------------------------------------------------------------  Indications:       Carotid artery disease. Risk Factors:      Hypertension, hyperlipidemia, current smoker. Comparison Study:  Prior carotid duplex performed on 08/29/2020 suggested 40-59%                    RICA stenosis and 9-37% LICA stenosis. Performing Technologist: Delorise Shiner RVT  Examination Guidelines: A complete evaluation includes B-mode imaging, spectral Doppler, color Doppler, and power Doppler as needed of all accessible portions of each vessel. Bilateral testing is considered an integral part of a complete examination. Limited examinations for reoccurring indications may be performed as noted.  Right Carotid Findings: +----------+--------+--------+--------+----------------------+--------+           PSV cm/sEDV cm/sStenosisPlaque Description    Comments +----------+--------+--------+--------+----------------------+--------+ CCA Prox  125     19                                             +----------+--------+--------+--------+----------------------+--------+ CCA Mid   89      17                                             +----------+--------+--------+--------+----------------------+--------+ CCA Distal92      24                                              +----------+--------+--------+--------+----------------------+--------+ ICA Prox  166     53      40-59%  calcific and irregular         +----------+--------+--------+--------+----------------------+--------+ ICA Mid   142     30                                             +----------+--------+--------+--------+----------------------+--------+ ICA Distal123     29                                             +----------+--------+--------+--------+----------------------+--------+ ECA       165     22              irregular                      +----------+--------+--------+--------+----------------------+--------+ +----------+--------+-------+----------------+-------------------+           PSV cm/sEDV cmsDescribe        Arm Pressure (mmHG) +----------+--------+-------+----------------+-------------------+ [id number]  Multiphasic, WNL                    +----------+--------+-------+----------------+-------------------+ +---------+--------+--+--------+--+---------+ VertebralPSV cm/s58EDV cm/s15Antegrade +---------+--------+--+--------+--+---------+  Left Carotid Findings: +----------+--------+--------+--------+--------------------------+--------+           PSV cm/sEDV cm/sStenosisPlaque Description        Comments +----------+--------+--------+--------+--------------------------+--------+ CCA Prox  152     36                                                 +----------+--------+--------+--------+--------------------------+--------+ CCA Mid   112     29                                                 +----------+--------+--------+--------+--------------------------+--------+ CCA Distal117     27                                                 +----------+--------+--------+--------+--------------------------+--------+ ICA Prox  138     38      1-39%   calcific and irregular              +----------+--------+--------+--------+--------------------------+--------+ ICA Mid   166     37                                                 +----------+--------+--------+--------+--------------------------+--------+ ICA Distal119     36                                                 +----------+--------+--------+--------+--------------------------+--------+ ECA       104     19              irregular and heterogenous         +----------+--------+--------+--------+--------------------------+--------+ +----------+--------+--------+----------------+-------------------+           PSV cm/sEDV cm/sDescribe        Arm Pressure (mmHG) +----------+--------+--------+----------------+-------------------+ [id number]     0       Multiphasic, WNL                    +----------+--------+--------+----------------+-------------------+ +---------+--------+--+--------+-+---------+ VertebralPSV cm/s46EDV cm/s9Antegrade +---------+--------+--+--------+-+---------+   Summary: Right Carotid: Velocities in the right ICA are consistent with a 40-59%                stenosis. Left Carotid: Velocities in the left ICA are consistent with a 1-39% stenosis. Vertebrals:  Bilateral vertebral arteries demonstrate antegrade flow. Subclavians: Normal flow hemodynamics were seen in bilateral subclavian              arteries. *See table(s) above for measurements and observations.  Electronically signed by Orlie Pollen on 11/12/2021 at 8:40:35 AM.    Final     Assessment & Plan:   Problem List Items Addressed This Visit     Adjustment disorder with mixed anxiety  and depressed mood    On Wellbutrin XL qd      Arthropathy    Blue-Emu cream - use 2-3 times a day      Relevant Orders   CBC with Differential/Platelet   Comprehensive metabolic panel   TSH   COPD, moderate (HCC)     Daliresp -  - pt stopped      Iron deficiency anemia - Primary   Relevant Orders   CBC with  Differential/Platelet   Iron, TIBC and Ferritin Panel   Major depressive disorder, recurrent episode, moderate (HCC)    Better on Wellbutrin XL      Relevant Orders   TSH      No orders of the defined types were placed in this encounter.     Follow-up: Return in about 3 months (around 09/09/2022) for a follow-up visit.  Walker Kehr, MD

## 2022-06-10 NOTE — Assessment & Plan Note (Signed)
Daliresp -  - pt stopped

## 2022-06-10 NOTE — Assessment & Plan Note (Signed)
On Wellbutrin XL qd

## 2022-06-10 NOTE — Patient Instructions (Addendum)
Blue-Emu cream - use 2-3 times a day   [employer]: Visit [place] every Saturday, year-round, rain or shine from 8-12 am at [address], [place] [address].

## 2022-06-10 NOTE — Assessment & Plan Note (Signed)
Better on Wellbutrin XL

## 2022-06-11 LAB — IRON,TIBC AND FERRITIN PANEL
%SAT: 10 % (calc) — ABNORMAL LOW (ref 16–45)
Ferritin: 8 ng/mL — ABNORMAL LOW (ref 16–288)
Iron: 41 ug/dL — ABNORMAL LOW (ref 45–160)
TIBC: 395 mcg/dL (calc) (ref 250–450)

## 2022-06-28 ENCOUNTER — Telehealth: Payer: Self-pay | Admitting: Internal Medicine

## 2022-06-28 MED ORDER — FERROUS SULFATE 325 (65 FE) MG PO TABS: ORAL_TABLET | ORAL | 5 refills | Status: DC

## 2022-06-28 NOTE — Telephone Encounter (Signed)
PT calls today in need of a refill on their ferrous sulfate (FEROSUL) 325 (65 FE) MG tablet. They are requesting this refill be sent to the [facility] on [address].   CB if needed: [phone]

## 2022-06-28 NOTE — Telephone Encounter (Signed)
Notified pt rx sent to [facility]  

## 2022-08-03 ENCOUNTER — Ambulatory Visit (INDEPENDENT_AMBULATORY_CARE_PROVIDER_SITE_OTHER): Payer: Medicare HMO | Admitting: Internal Medicine

## 2022-08-03 ENCOUNTER — Encounter: Payer: Self-pay | Admitting: Internal Medicine

## 2022-08-03 ENCOUNTER — Other Ambulatory Visit: Payer: Self-pay | Admitting: Internal Medicine

## 2022-08-03 VITALS — BP 140/84 | HR 91 | Temp 97.6°F | Ht 61.0 in | Wt 144.6 lb

## 2022-08-03 DIAGNOSIS — J449 Chronic obstructive pulmonary disease, unspecified: Secondary | ICD-10-CM

## 2022-08-03 DIAGNOSIS — J019 Acute sinusitis, unspecified: Secondary | ICD-10-CM | POA: Insufficient documentation

## 2022-08-03 DIAGNOSIS — Z23 Encounter for immunization: Secondary | ICD-10-CM

## 2022-08-03 DIAGNOSIS — J0191 Acute recurrent sinusitis, unspecified: Secondary | ICD-10-CM

## 2022-08-03 DIAGNOSIS — R413 Other amnesia: Secondary | ICD-10-CM | POA: Diagnosis not present

## 2022-08-03 DIAGNOSIS — M129 Arthropathy, unspecified: Secondary | ICD-10-CM | POA: Diagnosis not present

## 2022-08-03 MED ORDER — TEMAZEPAM 15 MG PO CAPS
ORAL_CAPSULE | ORAL | 5 refills | Status: DC
Start: 2022-08-03 — End: 2023-08-08

## 2022-08-03 MED ORDER — METHYLPREDNISOLONE 4 MG PO TBPK: ORAL_TABLET | ORAL | 0 refills | Status: DC

## 2022-08-03 MED ORDER — METHYLPREDNISOLONE ACETATE 80 MG/ML IJ SUSP
80.0000 mg | Freq: Once | INTRAMUSCULAR | Status: AC
  Administered 2022-08-03: 80 mg via INTRAMUSCULAR

## 2022-08-03 MED ORDER — CEFDINIR 300 MG PO CAPS: 300.0000 mg | ORAL_CAPSULE | Freq: Two times a day (BID) | ORAL | 0 refills | Status: DC

## 2022-08-03 NOTE — Progress Notes (Signed)
Subjective:  Patient ID: Theresa Aguirre, female    DOB: September 05, 1946  Age: 76 y.o. MRN: [id number]  CC: Follow-up and Medication Refill (NEED REFILL ON TEMAZEPAM)   HPI Theresa Aguirre presents for COPD, anxiety C/o nasal congestion, HA, thick mucus x 1-2 months, cough  Outpatient Medications Prior to Visit  Medication Sig Dispense Refill   aspirin (BAYER ASPIRIN) 325 MG tablet Take 0.5 tablets (162 mg total) by mouth daily. 100 tablet 3   b complex vitamins tablet Take 1 tablet by mouth daily. 100 tablet 3   buPROPion (WELLBUTRIN XL) 150 MG 24 hr tablet TAKE 1 TABLET(150 MG) BY MOUTH IN THE MORNING 30 tablet 5   clobetasol cream (TEMOVATE) 9.67 % Apply 1 application topically 2 (two) times daily. 60 g 3   cloNIDine (CATAPRES) 0.1 MG tablet Take 1 tablet (0.1 mg total) by mouth 3 (three) times daily as needed (take if top blood pressure is over 180). 90 tablet 3   ferrous sulfate (FEROSUL) 325 (65 FE) MG tablet TAKE 1 TABLET(325 MG) BY MOUTH DAILY 30 tablet 5   furosemide (LASIX) 20 MG tablet Take 1 tablet (20 mg total) by mouth 2 (two) times daily as needed for edema. 90 tablet 1   hydrochlorothiazide (MICROZIDE) 12.5 MG capsule TAKE 1 CAPSULE(12.5 MG) BY MOUTH DAILY 90 capsule 3   ketotifen (ZADITOR) 0.025 % ophthalmic solution Place 2 drops into both eyes 2 (two) times daily. 5 mL 1   levalbuterol (XOPENEX HFA) 45 MCG/ACT inhaler INHALE 2 PUFFS INTO THE LUNGS EVERY 6 HOURS AS NEEDED FOR WHEEZING OR SHORTNESS OF BREATH 45 g 3   lipase/protease/amylase (CREON) 36000 UNITS CPEP capsule Take 1 capsule (36,000 Units total) by mouth 3 (three) times daily before meals. 90 capsule 11   loratadine (CLARITIN) 10 MG tablet Take 1 tablet (10 mg total) by mouth daily. 90 tablet 3   oxymetazoline (AFRIN 12 HOUR) 0.05 % nasal spray Place 1 spray into both nostrils 2 (two) times daily. No longer than 5 days 30 mL 1   pantoprazole (PROTONIX) 40 MG tablet Take 1 tablet (40 mg total) by mouth 2 (two) times  daily. 60 tablet 3   potassium chloride (K-DUR) 10 MEQ tablet Take qd prn with Lasix 90 tablet 1   pseudoephedrine (SUDAFED) 120 MG 12 hr tablet Take 1 tablet (120 mg total) by mouth every 12 (twelve) hours as needed for congestion. 60 tablet 1   roflumilast (DALIRESP) 500 MCG TABS tablet Take 1 tablet (500 mcg total) by mouth daily. Annual appt due in August must see provider for future refills 90 tablet 1   Simethicone (GAS-X PO) Take by mouth.     Tiotropium Bromide Monohydrate (SPIRIVA RESPIMAT) 2.5 MCG/ACT AERS Inhale 2 puffs into the lungs daily. 4 g 11   temazepam (RESTORIL) 15 MG capsule 1-2 qhs prn 60 capsule 5   ALPRAZolam (XANAX) 1 MG tablet Take 0.5-1 tablets (0.5-1 mg total) by mouth 3 (three) times daily as needed for anxiety. (Patient not taking: Reported on 08/03/2022) 270 tablet 1   No facility-administered medications prior to visit.    ROS: Review of Systems  Constitutional:  Positive for fatigue. Negative for activity change, appetite change, chills and unexpected weight change.  HENT:  Positive for postnasal drip, rhinorrhea, sinus pressure and sinus pain. Negative for congestion and mouth sores.   Eyes:  Negative for visual disturbance.  Respiratory:  Positive for shortness of breath. Negative for cough and chest tightness.   Gastrointestinal:  Negative for abdominal pain and nausea.  Genitourinary:  Negative for difficulty urinating, frequency and vaginal pain.  Musculoskeletal:  Negative for back pain and gait problem.  Skin:  Negative for pallor and rash.  Neurological:  Positive for headaches. Negative for dizziness, tremors, weakness and numbness.  Psychiatric/Behavioral:  Negative for confusion, sleep disturbance and suicidal ideas.     Objective:  BP (!) 140/84 (BP Location: Left Arm)   Pulse 91   Temp 97.6 F (36.4 C) (Oral)   Ht '5\' 1"'$  (1.549 m)   Wt 144 lb 9.6 oz (65.6 kg)   SpO2 96%   BMI 27.32 kg/m   BP Readings from Last 3 Encounters:  08/03/22  (!) 140/84  06/10/22 (!) 152/88  01/19/22 120/70    Wt Readings from Last 3 Encounters:  08/03/22 144 lb 9.6 oz (65.6 kg)  06/10/22 144 lb (65.3 kg)  01/19/22 139 lb (63 kg)    Physical Exam Constitutional:      General: She is not in acute distress.    Appearance: Normal appearance. She is well-developed.  HENT:     Head: Normocephalic.     Right Ear: External ear normal.     Left Ear: External ear normal.     Nose: Nose normal.  Eyes:     General:        Right eye: No discharge.        Left eye: No discharge.     Conjunctiva/sclera: Conjunctivae normal.     Pupils: Pupils are equal, round, and reactive to light.  Neck:     Thyroid: No thyromegaly.     Vascular: No JVD.     Trachea: No tracheal deviation.  Cardiovascular:     Rate and Rhythm: Normal rate and regular rhythm.     Heart sounds: Normal heart sounds.  Pulmonary:     Effort: No respiratory distress.     Breath sounds: No stridor. No wheezing.  Abdominal:     General: Bowel sounds are normal. There is no distension.     Palpations: Abdomen is soft. There is no mass.     Tenderness: There is no abdominal tenderness. There is no guarding or rebound.  Musculoskeletal:        General: No tenderness.     Cervical back: Normal range of motion and neck supple. No rigidity.  Lymphadenopathy:     Cervical: No cervical adenopathy.  Skin:    Findings: No erythema or rash.  Neurological:     Cranial Nerves: No cranial nerve deficit.     Motor: No abnormal muscle tone.     Coordination: Coordination normal.     Deep Tendon Reflexes: Reflexes normal.  Psychiatric:        Behavior: Behavior normal.        Thought Content: Thought content normal.        Judgment: Judgment normal.     Lab Results  Component Value Date   WBC 6.2 06/10/2022   HGB 12.0 06/10/2022   HCT 37.7 06/10/2022   PLT 208.0 06/10/2022   GLUCOSE 86 06/10/2022   CHOL 206 (H) 05/14/2021   TRIG 180.0 (H) 05/14/2021   HDL 46.50 05/14/2021    LDLDIRECT 162.8 06/28/2011   LDLCALC 123 (H) 05/14/2021   ALT 16 06/10/2022   AST 21 06/10/2022   NA 141 06/10/2022   K 4.1 06/10/2022   CL 103 06/10/2022   CREATININE 0.74 06/10/2022   BUN 14 06/10/2022   CO2 32 06/10/2022  TSH 1.15 06/10/2022   HGBA1C 5.5 01/28/2020    VAS US CAROTID  Result Date: 11/12/2021 Carotid Arterial Duplex Study Patient Name:  IONA STAY  Date of Exam:   11/11/2021 Medical Rec #: [id number]       Accession #:    [id number] Date of Birth: [birth year]       Patient Gender: F Patient Age:   28 years Exam Location:  [facility] Procedure:      VAS US CAROTID Referring Phys: Tyrone Apple Taevin Mcferran --------------------------------------------------------------------------------  Indications:       Carotid artery disease. Risk Factors:      Hypertension, hyperlipidemia, current smoker. Comparison Study:  Prior carotid duplex performed on 08/29/2020 suggested 40-59%                    RICA stenosis and 3-53% LICA stenosis. Performing Technologist: Delorise Shiner RVT  Examination Guidelines: A complete evaluation includes B-mode imaging, spectral Doppler, color Doppler, and power Doppler as needed of all accessible portions of each vessel. Bilateral testing is considered an integral part of a complete examination. Limited examinations for reoccurring indications may be performed as noted.  Right Carotid Findings: +----------+--------+--------+--------+----------------------+--------+           PSV cm/sEDV cm/sStenosisPlaque Description    Comments +----------+--------+--------+--------+----------------------+--------+ CCA Prox  125     19                                             +----------+--------+--------+--------+----------------------+--------+ CCA Mid   89      17                                             +----------+--------+--------+--------+----------------------+--------+ CCA Distal92      24                                              +----------+--------+--------+--------+----------------------+--------+ ICA Prox  166     53      40-59%  calcific and irregular         +----------+--------+--------+--------+----------------------+--------+ ICA Mid   142     30                                             +----------+--------+--------+--------+----------------------+--------+ ICA Distal123     29                                             +----------+--------+--------+--------+----------------------+--------+ ECA       165     22              irregular                      +----------+--------+--------+--------+----------------------+--------+ +----------+--------+-------+----------------+-------------------+           PSV cm/sEDV cmsDescribe        Arm Pressure (mmHG) +----------+--------+-------+----------------+-------------------+ [id number]  Multiphasic, WNL                    +----------+--------+-------+----------------+-------------------+ +---------+--------+--+--------+--+---------+ VertebralPSV cm/s58EDV cm/s15Antegrade +---------+--------+--+--------+--+---------+  Left Carotid Findings: +----------+--------+--------+--------+--------------------------+--------+           PSV cm/sEDV cm/sStenosisPlaque Description        Comments +----------+--------+--------+--------+--------------------------+--------+ CCA Prox  152     36                                                 +----------+--------+--------+--------+--------------------------+--------+ CCA Mid   112     29                                                 +----------+--------+--------+--------+--------------------------+--------+ CCA Distal117     27                                                 +----------+--------+--------+--------+--------------------------+--------+ ICA Prox  138     38      1-39%   calcific and irregular              +----------+--------+--------+--------+--------------------------+--------+ ICA Mid   166     37                                                 +----------+--------+--------+--------+--------------------------+--------+ ICA Distal119     36                                                 +----------+--------+--------+--------+--------------------------+--------+ ECA       104     19              irregular and heterogenous         +----------+--------+--------+--------+--------------------------+--------+ +----------+--------+--------+----------------+-------------------+           PSV cm/sEDV cm/sDescribe        Arm Pressure (mmHG) +----------+--------+--------+----------------+-------------------+ [id number]     0       Multiphasic, WNL                    +----------+--------+--------+----------------+-------------------+ +---------+--------+--+--------+-+---------+ VertebralPSV cm/s46EDV cm/s9Antegrade +---------+--------+--+--------+-+---------+   Summary: Right Carotid: Velocities in the right ICA are consistent with a 40-59%                stenosis. Left Carotid: Velocities in the left ICA are consistent with a 1-39% stenosis. Vertebrals:  Bilateral vertebral arteries demonstrate antegrade flow. Subclavians: Normal flow hemodynamics were seen in bilateral subclavian              arteries. *See table(s) above for measurements and observations.  Electronically signed by Orlie Pollen on 11/12/2021 at 8:40:35 AM.    Final     Assessment & Plan:   Problem List Items Addressed This Visit     Acute sinusitis  New Depo-medrol IM Medrol pack  Omnicef x 2 weeks      Relevant Medications   cefdinir (OMNICEF) 300 MG capsule   methylPREDNISolone (MEDROL DOSEPAK) 4 MG TBPK tablet   Arthropathy    Chronic, severe, nondeforming  Potential benefits of a long term opioids use as well as potential risks (i.e. addiction risk, apnea etc) and complications (i.e.  Somnolence, constipation and others) were explained to the patient and were aknowledged.  Try Blue-Emu cream --  use 2-3 times a day      COPD, moderate (HCC)    Worse Depo-medrol IM Medrol pack  Omnicef x 2 weeks       Relevant Medications   methylPREDNISolone (MEDROL DOSEPAK) 4 MG TBPK tablet   Memory problem    MCD Discussed      Other Visit Diagnoses     Needs flu shot    -  Primary   Relevant Orders   Flu Vaccine QUAD High Dose(Fluad) (Completed)         Meds ordered this encounter  Medications   temazepam (RESTORIL) 15 MG capsule    Sig: 1-2 qhs prn    Dispense:  60 capsule    Refill:  5   cefdinir (OMNICEF) 300 MG capsule    Sig: Take 1 capsule (300 mg total) by mouth 2 (two) times daily.    Dispense:  28 capsule    Refill:  0   methylPREDNISolone (MEDROL DOSEPAK) 4 MG TBPK tablet    Sig: As directed    Dispense:  21 tablet    Refill:  0   methylPREDNISolone acetate (DEPO-MEDROL) injection 80 mg      Follow-up: Return in about 4 weeks (around 08/31/2022) for a follow-up visit.  Walker Kehr, MD

## 2022-08-03 NOTE — Assessment & Plan Note (Signed)
Worse Depo-medrol IM Medrol pack  Omnicef x 2 weeks

## 2022-08-03 NOTE — Assessment & Plan Note (Signed)
MCD Discussed

## 2022-08-03 NOTE — Assessment & Plan Note (Signed)
New Depo-medrol IM Medrol pack  Omnicef x 2 weeks

## 2022-08-03 NOTE — Progress Notes (Signed)
Pls cosign for DEPO Theresa AguirreMarland KitchenJohny Chess

## 2022-08-03 NOTE — Assessment & Plan Note (Signed)
Chronic, severe, nondeforming  Potential benefits of a long term opioids use as well as potential risks (i.e. addiction risk, apnea etc) and complications (i.e. Somnolence, constipation and others) were explained to the patient and were aknowledged.  Try Blue-Emu cream --  use 2-3 times a day

## 2022-08-06 ENCOUNTER — Other Ambulatory Visit: Payer: Self-pay | Admitting: Internal Medicine

## 2022-09-08 ENCOUNTER — Other Ambulatory Visit: Payer: Self-pay | Admitting: Internal Medicine

## 2022-09-14 ENCOUNTER — Ambulatory Visit: Payer: Medicare HMO | Admitting: Dermatology

## 2022-11-08 ENCOUNTER — Ambulatory Visit (INDEPENDENT_AMBULATORY_CARE_PROVIDER_SITE_OTHER): Payer: Medicare HMO | Admitting: Internal Medicine

## 2022-11-08 ENCOUNTER — Encounter: Payer: Self-pay | Admitting: Internal Medicine

## 2022-11-08 VITALS — BP 118/72 | HR 82 | Temp 97.9°F | Ht 61.0 in | Wt 144.0 lb

## 2022-11-08 DIAGNOSIS — J449 Chronic obstructive pulmonary disease, unspecified: Secondary | ICD-10-CM | POA: Diagnosis not present

## 2022-11-08 DIAGNOSIS — Z23 Encounter for immunization: Secondary | ICD-10-CM

## 2022-11-08 DIAGNOSIS — D509 Iron deficiency anemia, unspecified: Secondary | ICD-10-CM

## 2022-11-08 DIAGNOSIS — R600 Localized edema: Secondary | ICD-10-CM

## 2022-11-08 DIAGNOSIS — R69 Illness, unspecified: Secondary | ICD-10-CM | POA: Diagnosis not present

## 2022-11-08 DIAGNOSIS — F331 Major depressive disorder, recurrent, moderate: Secondary | ICD-10-CM

## 2022-11-08 DIAGNOSIS — R0609 Other forms of dyspnea: Secondary | ICD-10-CM | POA: Diagnosis not present

## 2022-11-08 LAB — COMPREHENSIVE METABOLIC PANEL
ALT: 16 U/L (ref 0–35)
AST: 20 U/L (ref 0–37)
Albumin: 4.6 g/dL (ref 3.5–5.2)
Alkaline Phosphatase: 54 U/L (ref 39–117)
BUN: 14 mg/dL (ref 6–23)
CO2: 30 mEq/L (ref 19–32)
Calcium: 9.8 mg/dL (ref 8.4–10.5)
Chloride: 101 mEq/L (ref 96–112)
Creatinine, Ser: 0.62 mg/dL (ref 0.40–1.20)
GFR: 86.01 mL/min (ref >60.00)
Glucose, Bld: 97 mg/dL (ref 70–99)
Potassium: 3.7 mEq/L (ref 3.5–5.1)
Sodium: 139 mEq/L (ref 135–145)
Total Bilirubin: 0.7 mg/dL (ref 0.2–1.2)
Total Protein: 7.3 g/dL (ref 6.0–8.3)

## 2022-11-08 LAB — CBC WITH DIFFERENTIAL/PLATELET
Basophils Absolute: 0.1 10*3/uL (ref 0.0–0.1)
Basophils Relative: 0.8 % (ref 0.0–3.0)
Eosinophils Absolute: 0.2 10*3/uL (ref 0.0–0.7)
Eosinophils Relative: 3 % (ref 0.0–5.0)
HCT: 41.6 % (ref 36.0–46.0)
Hemoglobin: 14.1 g/dL (ref 12.0–15.0)
Lymphocytes Relative: 32.5 % (ref 12.0–46.0)
Lymphs Abs: 2.6 10*3/uL (ref 0.7–4.0)
MCHC: 33.8 g/dL (ref 30.0–36.0)
MCV: 83.5 fl (ref 78.0–100.0)
Monocytes Absolute: 0.7 10*3/uL (ref 0.1–1.0)
Monocytes Relative: 8.8 % (ref 3.0–12.0)
Neutro Abs: 4.4 10*3/uL (ref 1.4–7.7)
Neutrophils Relative %: 54.9 % (ref 43.0–77.0)
Platelets: 233 10*3/uL (ref 150.0–400.0)
RBC: 4.98 Mil/uL (ref 3.87–5.11)
RDW: 14.2 % (ref 11.5–15.5)
WBC: 8 10*3/uL (ref 4.0–10.5)

## 2022-11-08 MED ORDER — ROFLUMILAST 500 MCG PO TABS: 500.0000 ug | ORAL_TABLET | Freq: Every day | ORAL | 3 refills | Status: DC

## 2022-11-08 NOTE — Assessment & Plan Note (Addendum)
Re-start Daliresp CXR 10 mo ago Walk more

## 2022-11-08 NOTE — Patient Instructions (Signed)
  Iron is found naturally in many foods such as:  Meat, poultry and fish. Eggs. Beans, peas, lentils, tofu. Some vegetables such as spinach and beets. Whole grains such as quinoa, whole oats and whole grain bread. Nuts, seeds and some dried fruit like raisins.

## 2022-11-08 NOTE — Assessment & Plan Note (Signed)
Check CBC 

## 2022-11-08 NOTE — Assessment & Plan Note (Signed)
2D ECHO

## 2022-11-08 NOTE — Addendum Note (Signed)
Addended by: Basil Dess on: 11/08/2022 03:21 PM   Modules accepted: Orders

## 2022-11-08 NOTE — Assessment & Plan Note (Signed)
Discussed stress - dog is sick w/CHF

## 2022-11-08 NOTE — Progress Notes (Signed)
Subjective:  Patient ID: Theresa Aguirre, female    DOB: 1946-04-26  Age: 77 y.o. MRN: [id number]  CC: Follow-up   HPI Theresa Aguirre presents for stress - dog is sick w/CHF F/u depression, HTN, GERD  Outpatient Medications Prior to Visit  Medication Sig Dispense Refill   aspirin (BAYER ASPIRIN) 325 MG tablet Take 0.5 tablets (162 mg total) by mouth daily. 100 tablet 3   b complex vitamins tablet Take 1 tablet by mouth daily. 100 tablet 3   buPROPion (WELLBUTRIN XL) 150 MG 24 hr tablet TAKE 1 TABLET(150 MG) BY MOUTH IN THE MORNING 30 tablet 5   cefdinir (OMNICEF) 300 MG capsule Take 1 capsule (300 mg total) by mouth 2 (two) times daily. 28 capsule 0   clobetasol cream (TEMOVATE) 7.03 % Apply 1 application topically 2 (two) times daily. 60 g 3   cloNIDine (CATAPRES) 0.1 MG tablet Take 1 tablet (0.1 mg total) by mouth 3 (three) times daily as needed (take if top blood pressure is over 180). 90 tablet 3   ferrous sulfate (FEROSUL) 325 (65 FE) MG tablet TAKE 1 TABLET(325 MG) BY MOUTH DAILY 30 tablet 5   furosemide (LASIX) 20 MG tablet Take 1 tablet (20 mg total) by mouth 2 (two) times daily as needed for edema. 90 tablet 1   hydrochlorothiazide (MICROZIDE) 12.5 MG capsule TAKE 1 CAPSULE(12.5 MG) BY MOUTH DAILY 90 capsule 3   ketotifen (ZADITOR) 0.025 % ophthalmic solution Place 2 drops into both eyes 2 (two) times daily. 5 mL 1   levalbuterol (XOPENEX HFA) 45 MCG/ACT inhaler INHALE 2 PUFFS INTO THE LUNGS EVERY 6 HOURS AS NEEDED FOR WHEEZING OR SHORTNESS OF BREATH 45 g 3   lipase/protease/amylase (CREON) 36000 UNITS CPEP capsule Take 1 capsule (36,000 Units total) by mouth 3 (three) times daily before meals. 90 capsule 11   loratadine (CLARITIN) 10 MG tablet Take 1 tablet (10 mg total) by mouth daily. 90 tablet 3   methylPREDNISolone (MEDROL DOSEPAK) 4 MG TBPK tablet As directed 21 tablet 0   oxymetazoline (AFRIN 12 HOUR) 0.05 % nasal spray Place 1 spray into both nostrils 2 (two) times daily.  No longer than 5 days 30 mL 1   pantoprazole (PROTONIX) 40 MG tablet Take 1 tablet (40 mg total) by mouth 2 (two) times daily. 60 tablet 3   potassium chloride (K-DUR) 10 MEQ tablet Take qd prn with Lasix 90 tablet 1   Simethicone (GAS-X PO) Take by mouth.     temazepam (RESTORIL) 15 MG capsule 1-2 qhs prn 60 capsule 5   Tiotropium Bromide Monohydrate (SPIRIVA RESPIMAT) 2.5 MCG/ACT AERS Inhale 2 puffs into the lungs daily. 4 g 11   roflumilast (DALIRESP) 500 MCG TABS tablet Take 1 tablet (500 mcg total) by mouth daily. Annual appt due in August must see provider for future refills 90 tablet 1   No facility-administered medications prior to visit.    ROS: Review of Systems  Constitutional:  Positive for fatigue. Negative for activity change, appetite change, chills and unexpected weight change.  HENT:  Negative for congestion, mouth sores and sinus pressure.   Eyes:  Negative for visual disturbance.  Respiratory:  Positive for chest tightness and shortness of breath. Negative for cough.   Gastrointestinal:  Negative for abdominal pain and nausea.  Genitourinary:  Negative for difficulty urinating, frequency and vaginal pain.  Musculoskeletal:  Negative for back pain and gait problem.  Skin:  Negative for pallor and rash.  Neurological:  Negative for dizziness,  tremors, weakness, numbness and headaches.  Psychiatric/Behavioral:  Positive for decreased concentration and sleep disturbance. Negative for confusion and suicidal ideas. The patient is nervous/anxious.     Objective:  BP 118/72 (BP Location: Left Arm, Patient Position: Sitting, Cuff Size: Normal)   Pulse 82   Temp 97.9 F (36.6 C) (Oral)   Ht '5\' 1"'$  (1.549 m)   Wt 144 lb (65.3 kg)   SpO2 99%   BMI 27.21 kg/m   BP Readings from Last 3 Encounters:  11/08/22 118/72  08/03/22 (!) 140/84  06/10/22 (!) 152/88    Wt Readings from Last 3 Encounters:  11/08/22 144 lb (65.3 kg)  08/03/22 144 lb 9.6 oz (65.6 kg)  06/10/22 144  lb (65.3 kg)    Physical Exam Constitutional:      General: She is not in acute distress.    Appearance: Normal appearance. She is well-developed.  HENT:     Head: Normocephalic.     Right Ear: External ear normal.     Left Ear: External ear normal.     Nose: Nose normal.  Eyes:     General:        Right eye: No discharge.        Left eye: No discharge.     Conjunctiva/sclera: Conjunctivae normal.     Pupils: Pupils are equal, round, and reactive to light.  Neck:     Thyroid: No thyromegaly.     Vascular: No JVD.     Trachea: No tracheal deviation.  Cardiovascular:     Rate and Rhythm: Normal rate and regular rhythm.     Heart sounds: Normal heart sounds.  Pulmonary:     Effort: No respiratory distress.     Breath sounds: No stridor. No wheezing.  Abdominal:     General: Bowel sounds are normal. There is no distension.     Palpations: Abdomen is soft. There is no mass.     Tenderness: There is no abdominal tenderness. There is no guarding or rebound.  Musculoskeletal:        General: No tenderness.     Cervical back: Normal range of motion and neck supple. No rigidity.  Lymphadenopathy:     Cervical: No cervical adenopathy.  Skin:    Findings: No erythema or rash.  Neurological:     Cranial Nerves: No cranial nerve deficit.     Motor: No abnormal muscle tone.     Coordination: Coordination normal.     Deep Tendon Reflexes: Reflexes normal.  Psychiatric:        Behavior: Behavior normal.        Thought Content: Thought content normal.        Judgment: Judgment normal.     Lab Results  Component Value Date   WBC 6.2 06/10/2022   HGB 12.0 06/10/2022   HCT 37.7 06/10/2022   PLT 208.0 06/10/2022   GLUCOSE 86 06/10/2022   CHOL 206 (H) 05/14/2021   TRIG 180.0 (H) 05/14/2021   HDL 46.50 05/14/2021   LDLDIRECT 162.8 06/28/2011   LDLCALC 123 (H) 05/14/2021   ALT 16 06/10/2022   AST 21 06/10/2022   NA 141 06/10/2022   K 4.1 06/10/2022   CL 103 06/10/2022    CREATININE 0.74 06/10/2022   BUN 14 06/10/2022   CO2 32 06/10/2022   TSH 1.15 06/10/2022   HGBA1C 5.5 01/28/2020    VAS US CAROTID  Result Date: 11/12/2021 Carotid Arterial Duplex Study Patient Name:  [place]  Date of  Exam:   11/11/2021 Medical Rec #: [id number]       Accession #:    [id number] Date of Birth: Apr 08, 1946       Patient Gender: F Patient Age:   53 years Exam Location:  [facility] Procedure:      VAS US CAROTID Referring Phys: Tyrone Apple Charrisse Masley --------------------------------------------------------------------------------  Indications:       Carotid artery disease. Risk Factors:      Hypertension, hyperlipidemia, current smoker. Comparison Study:  Prior carotid duplex performed on 08/29/2020 suggested 40-59%                    RICA stenosis and 7-32% LICA stenosis. Performing Technologist: Delorise Shiner RVT  Examination Guidelines: A complete evaluation includes B-mode imaging, spectral Doppler, color Doppler, and power Doppler as needed of all accessible portions of each vessel. Bilateral testing is considered an integral part of a complete examination. Limited examinations for reoccurring indications may be performed as noted.  Right Carotid Findings: +----------+--------+--------+--------+----------------------+--------+           PSV cm/sEDV cm/sStenosisPlaque Description    Comments +----------+--------+--------+--------+----------------------+--------+ CCA Prox  125     19                                             +----------+--------+--------+--------+----------------------+--------+ CCA Mid   89      17                                             +----------+--------+--------+--------+----------------------+--------+ CCA Distal92      24                                             +----------+--------+--------+--------+----------------------+--------+ ICA Prox  166     53      40-59%  calcific and irregular          +----------+--------+--------+--------+----------------------+--------+ ICA Mid   142     30                                             +----------+--------+--------+--------+----------------------+--------+ ICA Distal123     29                                             +----------+--------+--------+--------+----------------------+--------+ ECA       165     22              irregular                      +----------+--------+--------+--------+----------------------+--------+ +----------+--------+-------+----------------+-------------------+           PSV cm/sEDV cmsDescribe        Arm Pressure (mmHG) +----------+--------+-------+----------------+-------------------+ [id number]            Multiphasic, WNL                    +----------+--------+-------+----------------+-------------------+ +---------+--------+--+--------+--+---------+  VertebralPSV cm/s58EDV cm/s15Antegrade +---------+--------+--+--------+--+---------+  Left Carotid Findings: +----------+--------+--------+--------+--------------------------+--------+           PSV cm/sEDV cm/sStenosisPlaque Description        Comments +----------+--------+--------+--------+--------------------------+--------+ CCA Prox  152     36                                                 +----------+--------+--------+--------+--------------------------+--------+ CCA Mid   112     29                                                 +----------+--------+--------+--------+--------------------------+--------+ CCA Distal117     27                                                 +----------+--------+--------+--------+--------------------------+--------+ ICA Prox  138     38      1-39%   calcific and irregular             +----------+--------+--------+--------+--------------------------+--------+ ICA Mid   166     37                                                  +----------+--------+--------+--------+--------------------------+--------+ ICA Distal119     36                                                 +----------+--------+--------+--------+--------------------------+--------+ ECA       104     19              irregular and heterogenous         +----------+--------+--------+--------+--------------------------+--------+ +----------+--------+--------+----------------+-------------------+           PSV cm/sEDV cm/sDescribe        Arm Pressure (mmHG) +----------+--------+--------+----------------+-------------------+ [id number]     0       Multiphasic, WNL                    +----------+--------+--------+----------------+-------------------+ +---------+--------+--+--------+-+---------+ VertebralPSV cm/s46EDV cm/s9Antegrade +---------+--------+--+--------+-+---------+   Summary: Right Carotid: Velocities in the right ICA are consistent with a 40-59%                stenosis. Left Carotid: Velocities in the left ICA are consistent with a 1-39% stenosis. Vertebrals:  Bilateral vertebral arteries demonstrate antegrade flow. Subclavians: Normal flow hemodynamics were seen in bilateral subclavian              arteries. *See table(s) above for measurements and observations.  Electronically signed by Orlie Pollen on 11/12/2021 at 8:40:35 AM.    Final     Assessment & Plan:   Problem List Items Addressed This Visit       Respiratory   COPD, moderate ([place])    Re-start Daliresp      Relevant Medications   roflumilast (DALIRESP) 500 MCG TABS tablet  Other   Iron deficiency anemia - Primary    Check CBC      Relevant Orders   Comprehensive metabolic panel   CBC with Differential/Platelet   TSH   Major depressive disorder, recurrent episode, moderate (HCC)    Discussed stress - dog is sick w/CHF      Relevant Orders   TSH      Meds ordered this encounter  Medications   roflumilast (DALIRESP) 500 MCG TABS tablet     Sig: Take 1 tablet (500 mcg total) by mouth daily. Annual appt due in August must see provider for future refills    Dispense:  90 tablet    Refill:  3    ZERO refills remain on this prescription. Your patient is requesting advance approval of refills for this medication to [place]      Follow-up: Return in about 3 months (around 02/07/2023) for a follow-up visit.  Walker Kehr, MD

## 2022-11-08 NOTE — Assessment & Plan Note (Signed)
Resolving 2D ECHO

## 2022-11-09 LAB — TSH: TSH: 0.67 u[IU]/mL (ref 0.35–5.50)

## 2022-12-18 ENCOUNTER — Other Ambulatory Visit: Payer: Self-pay | Admitting: Internal Medicine

## 2022-12-26 ENCOUNTER — Ambulatory Visit (HOSPITAL_COMMUNITY)
Admission: RE | Admit: 2022-12-26 | Discharge: 2022-12-26 | Disposition: A | Discharge status: Home / Self Care | Payer: Medicare HMO | Source: Ambulatory Visit | Attending: Internal Medicine | Admitting: Internal Medicine

## 2022-12-26 DIAGNOSIS — R0600 Dyspnea, unspecified: Secondary | ICD-10-CM | POA: Diagnosis not present

## 2022-12-26 DIAGNOSIS — E785 Hyperlipidemia, unspecified: Secondary | ICD-10-CM | POA: Insufficient documentation

## 2022-12-26 DIAGNOSIS — R0609 Other forms of dyspnea: Secondary | ICD-10-CM

## 2022-12-26 DIAGNOSIS — I10 Essential (primary) hypertension: Secondary | ICD-10-CM | POA: Insufficient documentation

## 2022-12-26 DIAGNOSIS — I3139 Other pericardial effusion (noninflammatory): Secondary | ICD-10-CM | POA: Insufficient documentation

## 2022-12-26 LAB — ECHOCARDIOGRAM COMPLETE
Area-P 1/2: 3.77 cm2
Calc EF: 65.6 %
S' Lateral: 2.8 cm
Single Plane A2C EF: 67.5 %
Single Plane A4C EF: 63.4 %

## 2022-12-26 NOTE — Progress Notes (Signed)
Echocardiogram 2D Echocardiogram has been performed.  Theresa Aguirre 12/26/2022, 2:46 PM

## 2023-01-11 ENCOUNTER — Other Ambulatory Visit: Payer: Self-pay | Admitting: Internal Medicine

## 2023-01-11 ENCOUNTER — Ambulatory Visit: Payer: Medicare HMO | Admitting: Internal Medicine

## 2023-01-11 DIAGNOSIS — I3139 Other pericardial effusion (noninflammatory): Secondary | ICD-10-CM

## 2023-01-11 MED ORDER — FUROSEMIDE 20 MG PO TABS: 20.0000 mg | ORAL_TABLET | Freq: Two times a day (BID) | ORAL | 1 refills | Status: DC | PRN

## 2023-01-11 MED ORDER — DALIRESP 500 MCG PO TABS: 500.0000 ug | ORAL_TABLET | Freq: Every day | ORAL | 3 refills | Status: DC

## 2023-01-11 MED ORDER — DALIRESP 500 MCG PO TABS: 500.0000 ug | ORAL_TABLET | Freq: Every day | ORAL | 11 refills | Status: DC

## 2023-01-11 NOTE — Progress Notes (Signed)
ECHO report discussed C/o occ CP Cardiol ref Start Furosemide

## 2023-01-17 ENCOUNTER — Ambulatory Visit: Payer: Medicare HMO | Admitting: Internal Medicine

## 2023-01-18 ENCOUNTER — Other Ambulatory Visit (HOSPITAL_COMMUNITY): Payer: Self-pay

## 2023-01-18 ENCOUNTER — Ambulatory Visit: Payer: Medicare HMO | Admitting: Internal Medicine

## 2023-01-18 ENCOUNTER — Telehealth: Payer: Self-pay

## 2023-01-18 NOTE — Telephone Encounter (Signed)
Patient Advocate Encounter   Received notification from [facility] that prior authorization is required for Daliresp tablets   Submitted: n/a Key [id number]  Prior authorization not submitted at this time. Awaiting response from md.

## 2023-01-19 MED ORDER — ROFLUMILAST 500 MCG PO TABS: 500.0000 ug | ORAL_TABLET | Freq: Every day | ORAL | 11 refills | Status: DC

## 2023-01-19 NOTE — Telephone Encounter (Signed)
I am okay with the generic.  Thanks

## 2023-01-19 NOTE — Telephone Encounter (Signed)
Sent rx in generic ( Roflumilast 500 mg). Called pt no answer [facility] generic sent to [facility].Marland KitchenRaechel Chute

## 2023-02-06 DIAGNOSIS — L400 Psoriasis vulgaris: Secondary | ICD-10-CM | POA: Diagnosis not present

## 2023-02-08 ENCOUNTER — Encounter: Payer: Self-pay | Admitting: Cardiovascular Disease

## 2023-02-08 ENCOUNTER — Ambulatory Visit: Payer: Medicare HMO | Attending: Cardiovascular Disease | Admitting: Cardiovascular Disease

## 2023-02-08 VITALS — BP 125/77 | HR 80 | Ht 61.0 in | Wt 136.0 lb

## 2023-02-08 DIAGNOSIS — I6521 Occlusion and stenosis of right carotid artery: Secondary | ICD-10-CM | POA: Diagnosis not present

## 2023-02-08 DIAGNOSIS — Z01812 Encounter for preprocedural laboratory examination: Secondary | ICD-10-CM | POA: Diagnosis not present

## 2023-02-08 DIAGNOSIS — R072 Precordial pain: Secondary | ICD-10-CM | POA: Diagnosis not present

## 2023-02-08 DIAGNOSIS — F17200 Nicotine dependence, unspecified, uncomplicated: Secondary | ICD-10-CM | POA: Diagnosis not present

## 2023-02-08 DIAGNOSIS — E785 Hyperlipidemia, unspecified: Secondary | ICD-10-CM | POA: Diagnosis not present

## 2023-02-08 DIAGNOSIS — R931 Abnormal findings on diagnostic imaging of heart and coronary circulation: Secondary | ICD-10-CM

## 2023-02-08 MED ORDER — METOPROLOL TARTRATE 50 MG PO TABS: ORAL_TABLET | ORAL | 0 refills | Status: AC

## 2023-02-08 NOTE — Assessment & Plan Note (Signed)
Coronary calcium were performed 11/23/2018 was 99.  There was no obstructive CAD.  The patient does complain of episodic atypical chest heaviness.  I am going to get a coronary CTA to further evaluate.

## 2023-02-08 NOTE — Patient Instructions (Addendum)
Medication Instructions:   TAKE Metoprolol Tartrate 50 mg 2 hours before scheduled Coronary CTA  *If you need a refill on your cardiac medications before your next appointment, please call your pharmacy*  Lab Work: Your physician recommends that you return for lab work 1 week prior to scheduled Coronary CTA:   BMP  Your physician recommends that you return for lab work before follow up appointment with Dr. Allyson Sabal:  Fasting Lipid Panel-DO NOT eat or drink past midnight. Okay to have water and/or black coffee only the morning of labs.  Hepatic (Liver) Function Test  If you have labs (blood work) drawn today and your tests are completely normal, you will receive your results only by: MyChart Message (if you have MyChart) OR A paper copy in the mail If you have any lab test that is abnormal or we need to change your treatment, we will call you to review the results.   Testing/Procedures: Your physician has requested that you have a carotid duplex. This test is an ultrasound of the carotid arteries in your neck. It looks at blood flow through these arteries that supply the brain with blood. Allow one hour for this exam. There are no restrictions or special instructions.   Your physician has requested that you have cardiac CT. Cardiac CT Angiography (CTA), is a special type of CT scan that uses a computer to produce multi-dimensional views of major blood vessels throughout the body. In CT angiography, a contrast material is injected through an IV to help visualize the blood vessels. A cardiac CT angiogram is a procedure to look at the heart and the area around the heart. It may be done to help find the cause of chest pains or other symptoms of heart disease. During this procedure, a substance called contrast dye is injected into a vein in the arm. The contrast highlights the blood vessels in the area to be checked. A large X-ray machine (CT scanner), then takes detailed pictures of the heart and the  surrounding area. The procedure is also sometimes called a coronary CT angiogram, coronary artery scanning, or CTA.   Follow-Up: At [facility], you and your health needs are our priority.  As part of our continuing mission to provide you with exceptional heart care, we have created designated Provider Care Teams.  These Care Teams include your primary Cardiologist (physician) and Advanced Practice Providers (APPs -  Physician Assistants and Nurse Practitioners) who all work together to provide you with the care you need, when you need it.    Your next appointment:   After Coronary CTA   Provider:   Nanetta Batty, MD     Other Instructions    Your cardiac CT will be scheduled at one of the below locations:   [facility] [address] [place], [place] [address] [phone]  OR  [facility] [address] [place], [place] [address] [phone]  OR   [facility] [address] [place], [place] [address] [phone]  If scheduled at [facility] please arrive at the [facility] Entrance (Entrance C2) of [facility] 30 minutes prior to test start time. You can use the FREE valet parking offered at entrance C (encouraged to control the heart rate for the test)  Proceed to the [facility] Radiology Department (first floor) to check-in and test prep.  All radiology patients and guests should use entrance C2 at [facility],  accessed from [facility], even though the hospital's physical address listed is [address].    If scheduled at [facility] or [facility], please arrive 15 mins early for check-in and test prep.   Please follow these instructions carefully (unless otherwise directed):  Hold all erectile dysfunction medications at least 3 days (72 hrs) prior to test. (Ie  viagra, cialis, sildenafil, tadalafil, etc) We will administer nitroglycerin during this exam.   On the Night Before the Test: Be sure to Drink plenty of water. Do not consume any caffeinated/decaffeinated beverages or chocolate 12 hours prior to your test. Do not take any antihistamines 12 hours prior to your test. If the patient has contrast allergy: Patient will need a prescription for Prednisone and very clear instructions (as follows): Prednisone 50 mg - take 13 hours prior to test Take another Prednisone 50 mg 7 hours prior to test Take another Prednisone 50 mg 1 hour prior to test Take Benadryl 50 mg 1 hour prior to test Patient must complete all four doses of above prophylactic medications. Patient will need a ride after test due to Benadryl.  On the Day of the Test: Drink plenty of water until 1 hour prior to the test. Do not eat any food 1 hour prior to test. You may take your regular medications prior to the test.  Take metoprolol (Lopressor) two hours prior to test. If you take Furosemide/Hydrochlorothiazide/Spironolactone, please HOLD on the morning of the test. FEMALES- please wear underwire-free bra if available, avoid dresses & tight clothing       After the Test: Drink plenty of water. After receiving IV contrast, you may experience a mild flushed feeling. This is normal. On occasion, you may experience a mild rash up to 24 hours after the test. This is not dangerous. If this occurs, you can take Benadryl 25 mg and increase your fluid intake. If you experience trouble breathing, this can be serious. If it is severe call 911 IMMEDIATELY. If it is mild, please call our office. If you take any of these medications: Glipizide/Metformin, Avandament, Glucavance, please do not take 48 hours after completing test unless otherwise instructed.  We will call to schedule your test 2-4 weeks out understanding that some insurance companies will need an authorization prior to the  service being performed.   For non-scheduling related questions, please contact the cardiac imaging nurse navigator should you have any questions/concerns: Rockwell Alexandria, Cardiac Imaging Nurse Navigator Larey Brick, Cardiac Imaging Nurse Navigator [facility] Direct Office Dial: [phone]   For scheduling needs, including cancellations and rescheduling, please call [place], [phone].

## 2023-02-08 NOTE — Assessment & Plan Note (Signed)
Long history tobacco abuse having smoked 1 pack/day for 40 years and quit 3 years ago.

## 2023-02-08 NOTE — Progress Notes (Signed)
02/08/2023 Theresa Aguirre   1946-07-18  [id number]  Primary Physician Plotnikov, Georgina Quint, MD Primary Cardiologist: Runell Gess MD Nicholes Calamity, [place]  HPI:  Theresa Aguirre is a 77 y.o. married Caucasian female mother of 1 living son (1 son died in 02-24-2015 from a myocardial infarction at age 67, grandmother to 31 grand who was referred by Dr. Posey Rea, her primary care provider, for chest pain.  Her husband Barkley Bruns is also a patient of mine.  She is a retired Airline pilot.  She has a history of 40 pack years of tobacco use having quit 3 years ago.  She does have history of hyperlipidemia not on statin therapy questionable history of "statin myopathy".  Her mother had bypass surgery at age 6.  She is never had a heart attack or stroke.  She does have COPD.  She complains of daily chest pain/pressure which is somewhat atypical.  She did have a coronary calcium score performed 11/23/2020 is elevated at 99 without obstructive disease.  2D echo performed 12/26/2022 was essentially normal with a small pericardial effusion.  She does have moderate right ICA stenosis.   Current Meds  Medication Sig   aspirin (BAYER ASPIRIN) 325 MG tablet Take 0.5 tablets (162 mg total) by mouth daily.   b complex vitamins tablet Take 1 tablet by mouth daily.   buPROPion (WELLBUTRIN XL) 150 MG 24 hr tablet TAKE 1 TABLET(150 MG) BY MOUTH IN THE MORNING   clobetasol cream (TEMOVATE) 0.05 % Apply 1 application topically 2 (two) times daily.   cloNIDine (CATAPRES) 0.1 MG tablet Take 1 tablet (0.1 mg total) by mouth 3 (three) times daily as needed (take if top blood pressure is over 180).   furosemide (LASIX) 20 MG tablet Take 1 tablet (20 mg total) by mouth 2 (two) times daily as needed for edema.   hydrochlorothiazide (MICROZIDE) 12.5 MG capsule TAKE 1 CAPSULE(12.5 MG) BY MOUTH DAILY   ketotifen (ZADITOR) 0.025 % ophthalmic solution Place 2 drops into both eyes 2 (two) times daily.    levalbuterol (XOPENEX HFA) 45 MCG/ACT inhaler INHALE 2 PUFFS INTO THE LUNGS EVERY 6 HOURS AS NEEDED FOR WHEEZING OR SHORTNESS OF BREATH   lipase/protease/amylase (CREON) 36000 UNITS CPEP capsule Take 1 capsule (36,000 Units total) by mouth 3 (three) times daily before meals.   loratadine (CLARITIN) 10 MG tablet Take 1 tablet (10 mg total) by mouth daily.   methylPREDNISolone (MEDROL DOSEPAK) 4 MG TBPK tablet As directed   oxymetazoline (AFRIN 12 HOUR) 0.05 % nasal spray Place 1 spray into both nostrils 2 (two) times daily. No longer than 5 days   pantoprazole (PROTONIX) 40 MG tablet Take 1 tablet (40 mg total) by mouth 2 (two) times daily.   potassium chloride (K-DUR) 10 MEQ tablet Take qd prn with Lasix   roflumilast (DALIRESP) 500 MCG TABS tablet Take 1 tablet (500 mcg total) by mouth daily.   Simethicone (GAS-X PO) Take by mouth.   temazepam (RESTORIL) 15 MG capsule 1-2 qhs prn   Tiotropium Bromide Monohydrate (SPIRIVA RESPIMAT) 2.5 MCG/ACT AERS Inhale 2 puffs into the lungs daily.     Allergies  Allergen Reactions   Albuterol     palpitations   Desvenlafaxine     REACTION: nausea   Doxycycline     REACTION: nausea   Duloxetine     REACTION: fatigue   Fluoxetine Hcl     REACTION: sad   Pravastatin     myalgia  Social History   Socioeconomic History   Marital status: Married    Spouse name: Not on file   Number of children: Not on file   Years of education: Not on file   Highest education level: Not on file  Occupational History   Not on file  Tobacco Use   Smoking status: Every Day    Packs/day: 0.25    Years: 40.00    Additional pack years: 0.00    Total pack years: 10.00    Types: Cigarettes   Smokeless tobacco: Never   Tobacco comments:    I usually smoke about 10 cigarettes a day.  Regular Exercise-No  Vaping Use   Vaping Use: Never used  Substance and Sexual Activity   Alcohol use: No   Drug use: Yes    Types: Marijuana    Comment: occ. to sleep per pt    Sexual activity: Not on file    Comment: Hysterectomy  Other Topics Concern   Not on file  Social History Narrative   Not on file   Social Determinants of Health   Financial Resource Strain: Not on file  Food Insecurity: Not on file  Transportation Needs: Not on file  Physical Activity: Not on file  Stress: Not on file  Social Connections: Not on file  Intimate Partner Violence: Not on file     Review of Systems: General: negative for chills, fever, night sweats or weight changes.  Cardiovascular: negative for chest pain, dyspnea on exertion, edema, orthopnea, palpitations, paroxysmal nocturnal dyspnea or shortness of breath Dermatological: negative for rash Respiratory: negative for cough or wheezing Urologic: negative for hematuria Abdominal: negative for nausea, vomiting, diarrhea, bright red blood per rectum, melena, or hematemesis Neurologic: negative for visual changes, syncope, or dizziness All other systems reviewed and are otherwise negative except as noted above.    Blood pressure 125/77, pulse 80, height 5\' 1"  (1.549 m), weight 136 lb (61.7 kg).  General appearance: alert and no distress Neck: no adenopathy, no carotid bruit, no JVD, supple, symmetrical, trachea midline, and thyroid not enlarged, symmetric, no tenderness/mass/nodules Lungs: clear to auscultation bilaterally Heart: regular rate and rhythm, S1, S2 normal, no murmur, click, rub or gallop Extremities: extremities normal, atraumatic, no cyanosis or edema Pulses: 2+ and symmetric Skin: Skin color, texture, turgor normal. No rashes or lesions Neurologic: Grossly normal  EKG sinus rhythm at 80 without ST or T wave changes.  Personally reviewed this EKG.  ASSESSMENT AND PLAN:   Dyslipidemia History of hyperlipidemia currently not on statin therapy with lipid profile performed 05/14/2021 revealing total cholesterol 206, LDL 123 and HDL of 46.  I am going to recheck a fasting lipid liver profile.  Given  her elevated coronary calcium score she probably should be therapy.  She apparently has a diagnosis of "statin myopathy".  TOBACCO USE DISORDER/SMOKER-SMOKING CESSATION DISCUSSED Long history tobacco abuse having smoked 1 pack/day for 40 years and quit 3 years ago.  Carotid stenosis History of moderate right ICA stenosis by duplex ultrasound most recently performed 11/11/2021.  This will be repeated.  Elevated coronary artery calcium score Coronary calcium were performed 11/23/2018 was 99.  There was no obstructive CAD.  The patient does complain of episodic atypical chest heaviness.  I am going to get a coronary CTA to further evaluate.     Runell Gess MD FACP,FACC,FAHA, [facility] 02/08/2023 3:40 PM

## 2023-02-08 NOTE — Assessment & Plan Note (Addendum)
History of hyperlipidemia currently not on statin therapy with lipid profile performed 05/14/2021 revealing total cholesterol 206, LDL 123 and HDL of 46.  I am going to recheck a fasting lipid liver profile.  Given her elevated coronary calcium score she probably should be therapy.  She apparently has a diagnosis of "statin myopathy".

## 2023-02-08 NOTE — Addendum Note (Signed)
Addended by: Dorris Fetch on: 02/08/2023 04:10 PM   Modules accepted: Orders

## 2023-02-08 NOTE — Assessment & Plan Note (Signed)
History of moderate right ICA stenosis by duplex ultrasound most recently performed 11/11/2021.  This will be repeated.

## 2023-02-14 ENCOUNTER — Other Ambulatory Visit: Payer: Self-pay | Admitting: Internal Medicine

## 2023-02-16 ENCOUNTER — Other Ambulatory Visit: Payer: Self-pay | Admitting: Internal Medicine

## 2023-02-16 ENCOUNTER — Ambulatory Visit (HOSPITAL_COMMUNITY): Payer: Medicare HMO

## 2023-02-16 DIAGNOSIS — E785 Hyperlipidemia, unspecified: Secondary | ICD-10-CM

## 2023-02-16 DIAGNOSIS — I3139 Other pericardial effusion (noninflammatory): Secondary | ICD-10-CM

## 2023-02-23 ENCOUNTER — Encounter (HOSPITAL_COMMUNITY): Payer: Medicare HMO

## 2023-02-23 ENCOUNTER — Other Ambulatory Visit (INDEPENDENT_AMBULATORY_CARE_PROVIDER_SITE_OTHER): Payer: Medicare HMO

## 2023-02-23 DIAGNOSIS — E785 Hyperlipidemia, unspecified: Secondary | ICD-10-CM | POA: Diagnosis not present

## 2023-02-23 DIAGNOSIS — I3139 Other pericardial effusion (noninflammatory): Secondary | ICD-10-CM

## 2023-02-23 LAB — COMPREHENSIVE METABOLIC PANEL
ALT: 14 U/L (ref 0–35)
AST: 17 U/L (ref 0–37)
Albumin: 4.1 g/dL (ref 3.5–5.2)
Alkaline Phosphatase: 58 U/L (ref 39–117)
BUN: 10 mg/dL (ref 6–23)
CO2: 30 mEq/L (ref 19–32)
Calcium: 9.5 mg/dL (ref 8.4–10.5)
Chloride: 102 mEq/L (ref 96–112)
Creatinine, Ser: 0.69 mg/dL (ref 0.40–1.20)
GFR: 83.65 mL/min (ref >60.00)
Glucose, Bld: 103 mg/dL — ABNORMAL HIGH (ref 70–99)
Potassium: 3.9 mEq/L (ref 3.5–5.1)
Sodium: 140 mEq/L (ref 135–145)
Total Bilirubin: 0.5 mg/dL (ref 0.2–1.2)
Total Protein: 7 g/dL (ref 6.0–8.3)

## 2023-02-23 LAB — LIPID PANEL
Cholesterol: 234 mg/dL — ABNORMAL HIGH (ref 0–200)
HDL: 51.9 mg/dL (ref >39.00)
LDL Cholesterol: 160 mg/dL — ABNORMAL HIGH (ref 0–99)
NonHDL: 182.4
Total CHOL/HDL Ratio: 5
Triglycerides: 112 mg/dL (ref 0.0–149.0)
VLDL: 22.4 mg/dL (ref 0.0–40.0)

## 2023-02-27 ENCOUNTER — Ambulatory Visit (HOSPITAL_COMMUNITY)
Admission: RE | Admit: 2023-02-27 | Discharge: 2023-02-27 | Disposition: A | Discharge status: Home / Self Care | Payer: Medicare HMO | Source: Ambulatory Visit | Attending: Surgery | Admitting: Surgery

## 2023-02-27 DIAGNOSIS — I6521 Occlusion and stenosis of right carotid artery: Secondary | ICD-10-CM | POA: Diagnosis not present

## 2023-02-28 ENCOUNTER — Telehealth: Payer: Self-pay | Admitting: Cardiovascular Disease

## 2023-02-28 NOTE — Telephone Encounter (Signed)
Pt would like a callback to get clarity on when she should see MD again because her OV discharge says to come see him after the CT but she said she was given a pink slip that's tell her to come see him a week before CT. Please advise

## 2023-02-28 NOTE — Telephone Encounter (Signed)
Called patient, LVM to call back.  Left call back number.   

## 2023-03-01 NOTE — Telephone Encounter (Signed)
Left message for patient, the appointment one week prior to the CT scan is for labs, bmp. Then , she will see dr berry after the CT scan is complete. She is to call with questions.

## 2023-03-10 ENCOUNTER — Telehealth (HOSPITAL_COMMUNITY): Payer: Self-pay | Admitting: Emergency Medicine

## 2023-03-10 NOTE — Telephone Encounter (Signed)
Attempted to call patient regarding upcoming cardiac CT appointment. °Left message on voicemail with name and callback number °Wendell Nicoson RN Navigator Cardiac Imaging °[facility] °[phone] Office °[phone] Cell ° °

## 2023-03-13 ENCOUNTER — Ambulatory Visit (HOSPITAL_COMMUNITY)
Admission: RE | Admit: 2023-03-13 | Discharge: 2023-03-13 | Disposition: A | Discharge status: Home / Self Care | Payer: Medicare HMO | Source: Ambulatory Visit | Attending: Cardiovascular Disease | Admitting: Cardiovascular Disease

## 2023-03-13 DIAGNOSIS — R931 Abnormal findings on diagnostic imaging of heart and coronary circulation: Secondary | ICD-10-CM

## 2023-03-13 DIAGNOSIS — I700 Atherosclerosis of aorta: Secondary | ICD-10-CM | POA: Diagnosis not present

## 2023-03-13 DIAGNOSIS — R072 Precordial pain: Secondary | ICD-10-CM | POA: Diagnosis not present

## 2023-03-13 MED ORDER — NITROGLYCERIN 0.4 MG SL SUBL
0.8000 mg | SUBLINGUAL_TABLET | Freq: Once | SUBLINGUAL | Status: AC
  Administered 2023-03-13: 0.8 mg via SUBLINGUAL

## 2023-03-13 MED ORDER — IOHEXOL 350 MG/ML SOLN
95.0000 mL | Freq: Once | INTRAVENOUS | Status: AC | PRN
  Administered 2023-03-13: 95 mL via INTRAVENOUS

## 2023-03-13 MED ORDER — NITROGLYCERIN 0.4 MG SL SUBL
SUBLINGUAL_TABLET | SUBLINGUAL | Status: AC
  Filled 2023-03-13: qty 2

## 2023-03-30 ENCOUNTER — Other Ambulatory Visit: Payer: Self-pay | Admitting: Internal Medicine

## 2023-05-04 DIAGNOSIS — H04223 Epiphora due to insufficient drainage, bilateral lacrimal glands: Secondary | ICD-10-CM | POA: Diagnosis not present

## 2023-05-04 DIAGNOSIS — H04553 Acquired stenosis of bilateral nasolacrimal duct: Secondary | ICD-10-CM | POA: Diagnosis not present

## 2023-05-04 DIAGNOSIS — H0279 Other degenerative disorders of eyelid and periocular area: Secondary | ICD-10-CM | POA: Diagnosis not present

## 2023-05-04 DIAGNOSIS — H04563 Stenosis of bilateral lacrimal punctum: Secondary | ICD-10-CM | POA: Diagnosis not present

## 2023-05-04 DIAGNOSIS — H527 Unspecified disorder of refraction: Secondary | ICD-10-CM | POA: Diagnosis not present

## 2023-06-07 ENCOUNTER — Ambulatory Visit (INDEPENDENT_AMBULATORY_CARE_PROVIDER_SITE_OTHER): Payer: Medicare HMO | Admitting: Internal Medicine

## 2023-06-07 ENCOUNTER — Encounter: Payer: Self-pay | Admitting: Internal Medicine

## 2023-06-07 VITALS — BP 120/72 | HR 80 | Temp 98.6°F | Ht 61.0 in | Wt 129.0 lb

## 2023-06-07 DIAGNOSIS — H04123 Dry eye syndrome of bilateral lacrimal glands: Secondary | ICD-10-CM

## 2023-06-07 DIAGNOSIS — R634 Abnormal weight loss: Secondary | ICD-10-CM

## 2023-06-07 DIAGNOSIS — D509 Iron deficiency anemia, unspecified: Secondary | ICD-10-CM

## 2023-06-07 DIAGNOSIS — K2101 Gastro-esophageal reflux disease with esophagitis, with bleeding: Secondary | ICD-10-CM | POA: Diagnosis not present

## 2023-06-07 DIAGNOSIS — R682 Dry mouth, unspecified: Secondary | ICD-10-CM | POA: Diagnosis not present

## 2023-06-07 DIAGNOSIS — R490 Dysphonia: Secondary | ICD-10-CM | POA: Diagnosis not present

## 2023-06-07 LAB — SEDIMENTATION RATE: Sed Rate: 26 mm/h (ref 0–30)

## 2023-06-07 LAB — CBC WITH DIFFERENTIAL/PLATELET
Basophils Absolute: 0.1 10*3/uL (ref 0.0–0.1)
Basophils Relative: 0.9 % (ref 0.0–3.0)
Eosinophils Absolute: 0.2 10*3/uL (ref 0.0–0.7)
Eosinophils Relative: 3.1 % (ref 0.0–5.0)
HCT: 42 % (ref 36.0–46.0)
Hemoglobin: 13.5 g/dL (ref 12.0–15.0)
Lymphocytes Relative: 28 % (ref 12.0–46.0)
Lymphs Abs: 2.1 10*3/uL (ref 0.7–4.0)
MCHC: 32.1 g/dL (ref 30.0–36.0)
MCV: 81.3 fl (ref 78.0–100.0)
Monocytes Absolute: 0.7 10*3/uL (ref 0.1–1.0)
Monocytes Relative: 9.9 % (ref 3.0–12.0)
Neutro Abs: 4.3 10*3/uL (ref 1.4–7.7)
Neutrophils Relative %: 58.1 % (ref 43.0–77.0)
Platelets: 237 10*3/uL (ref 150.0–400.0)
RBC: 5.16 Mil/uL — ABNORMAL HIGH (ref 3.87–5.11)
RDW: 15.1 % (ref 11.5–15.5)
WBC: 7.5 10*3/uL (ref 4.0–10.5)

## 2023-06-07 LAB — COMPREHENSIVE METABOLIC PANEL
ALT: 13 U/L (ref 0–35)
AST: 16 U/L (ref 0–37)
Albumin: 4.2 g/dL (ref 3.5–5.2)
Alkaline Phosphatase: 68 U/L (ref 39–117)
BUN: 13 mg/dL (ref 6–23)
CO2: 32 meq/L (ref 19–32)
Calcium: 9.7 mg/dL (ref 8.4–10.5)
Chloride: 101 meq/L (ref 96–112)
Creatinine, Ser: 0.68 mg/dL (ref 0.40–1.20)
GFR: 83.78 mL/min (ref >60.00)
Glucose, Bld: 94 mg/dL (ref 70–99)
Potassium: 3.2 meq/L — ABNORMAL LOW (ref 3.5–5.1)
Sodium: 142 meq/L (ref 135–145)
Total Bilirubin: 0.7 mg/dL (ref 0.2–1.2)
Total Protein: 7.2 g/dL (ref 6.0–8.3)

## 2023-06-07 MED ORDER — PANCRELIPASE (LIP-PROT-AMYL) 36000-114000 UNITS PO CPEP: 36000.0000 [IU] | ORAL_CAPSULE | Freq: Three times a day (TID) | ORAL | 11 refills | Status: AC

## 2023-06-07 MED ORDER — SODIUM FLUORIDE 1.1 % DT PSTE
PASTE | DENTAL | 0 refills | Status: AC
Start: 2023-06-07 — End: ?

## 2023-06-07 MED ORDER — PREDNISONE 10 MG PO TABS
ORAL_TABLET | ORAL | 1 refills | Status: DC
Start: 2023-06-07 — End: 2023-07-05

## 2023-06-07 NOTE — Assessment & Plan Note (Signed)
Worse Ref to Dr Pollyann Kennedy

## 2023-06-07 NOTE — Assessment & Plan Note (Signed)
Can't take iron po lately

## 2023-06-07 NOTE — Progress Notes (Signed)
Subjective:  Patient ID: Theresa Aguirre, female    DOB: [birth year]  Age: 77 y.o. MRN: [id number]  CC: Follow-up (Discuss eye sight and medication, discuss lost of voice)   HPI Theresa Aguirre presents for voice loss Dry skin, dry mouth F/u COPD, GERD   Outpatient Medications Prior to Visit  Medication Sig Dispense Refill   aspirin (BAYER ASPIRIN) 325 MG tablet Take 0.5 tablets (162 mg total) by mouth daily. 100 tablet 3   b complex vitamins tablet Take 1 tablet by mouth daily. 100 tablet 3   buPROPion (WELLBUTRIN XL) 150 MG 24 hr tablet TAKE 1 TABLET(150 MG) BY MOUTH IN THE MORNING 90 tablet 1   clobetasol cream (TEMOVATE) 0.05 % Apply 1 application topically 2 (two) times daily. 60 g 3   cloNIDine (CATAPRES) 0.1 MG tablet Take 1 tablet (0.1 mg total) by mouth 3 (three) times daily as needed (take if top blood pressure is over 180). 90 tablet 3   furosemide (LASIX) 20 MG tablet Take 1 tablet (20 mg total) by mouth 2 (two) times daily as needed for edema. 90 tablet 1   hydrochlorothiazide (MICROZIDE) 12.5 MG capsule TAKE 1 CAPSULE(12.5 MG) BY MOUTH DAILY 90 capsule 3   ketotifen (ZADITOR) 0.025 % ophthalmic solution Place 2 drops into both eyes 2 (two) times daily. 5 mL 1   levalbuterol (XOPENEX HFA) 45 MCG/ACT inhaler INHALE 2 PUFFS INTO THE LUNGS EVERY 6 HOURS AS NEEDED FOR WHEEZING OR SHORTNESS OF BREATH 45 g 3   loratadine (CLARITIN) 10 MG tablet TAKE 1 TABLET(10 MG) BY MOUTH DAILY 90 tablet 3   metoprolol tartrate (LOPRESSOR) 50 MG tablet Take 2 hours prior to procedure 1 tablet 0   oxymetazoline (AFRIN 12 HOUR) 0.05 % nasal spray Place 1 spray into both nostrils 2 (two) times daily. No longer than 5 days 30 mL 1   pantoprazole (PROTONIX) 40 MG tablet Take 1 tablet (40 mg total) by mouth 2 (two) times daily. 60 tablet 3   potassium chloride (K-DUR) 10 MEQ tablet Take qd prn with Lasix 90 tablet 1   roflumilast (DALIRESP) 500 MCG TABS tablet Take 1 tablet (500 mcg total) by mouth  daily. 30 tablet 11   Simethicone (GAS-X PO) Take by mouth.     temazepam (RESTORIL) 15 MG capsule 1-2 qhs prn 60 capsule 5   Tiotropium Bromide Monohydrate (SPIRIVA RESPIMAT) 2.5 MCG/ACT AERS Inhale 2 puffs into the lungs daily. 4 g 11   lipase/protease/amylase (CREON) 36000 UNITS CPEP capsule Take 1 capsule (36,000 Units total) by mouth 3 (three) times daily before meals. 90 capsule 11   methylPREDNISolone (MEDROL DOSEPAK) 4 MG TBPK tablet As directed 21 tablet 0   No facility-administered medications prior to visit.    ROS: Review of Systems  Constitutional:  Negative for activity change, appetite change, chills, fatigue and unexpected weight change.  HENT:  Negative for congestion, mouth sores and sinus pressure.   Eyes:  Negative for visual disturbance.  Respiratory:  Negative for cough and chest tightness.   Gastrointestinal:  Negative for abdominal pain and nausea.  Genitourinary:  Negative for difficulty urinating, frequency and vaginal pain.  Musculoskeletal:  Negative for back pain and gait problem.  Skin:  Negative for pallor and rash.  Neurological:  Negative for dizziness, tremors, weakness, numbness and headaches.  Psychiatric/Behavioral:  Negative for confusion and sleep disturbance.     Objective:  BP 120/72 (BP Location: Right Arm, Patient Position: Sitting, Cuff Size: Large)   Pulse 80  Temp 98.6 F (37 C) (Oral)   Ht 5\' 1"  (1.549 m)   Wt 129 lb (58.5 kg)   SpO2 98%   BMI 24.37 kg/m   BP Readings from Last 3 Encounters:  06/07/23 120/72  03/13/23 121/77  02/08/23 125/77    Wt Readings from Last 3 Encounters:  06/07/23 129 lb (58.5 kg)  02/08/23 136 lb (61.7 kg)  11/08/22 144 lb (65.3 kg)    Physical Exam Constitutional:      General: She is not in acute distress.    Appearance: She is well-developed. She is obese.  HENT:     Head: Normocephalic.     Right Ear: External ear normal.     Left Ear: External ear normal.     Nose: Nose normal.   Eyes:     General:        Right eye: No discharge.        Left eye: No discharge.     Conjunctiva/sclera: Conjunctivae normal.     Pupils: Pupils are equal, round, and reactive to light.  Neck:     Thyroid: No thyromegaly.     Vascular: No JVD.     Trachea: No tracheal deviation.  Cardiovascular:     Rate and Rhythm: Normal rate and regular rhythm.     Heart sounds: Normal heart sounds.  Pulmonary:     Effort: No respiratory distress.     Breath sounds: No stridor. No wheezing.  Abdominal:     General: Bowel sounds are normal. There is no distension.     Palpations: Abdomen is soft. There is no mass.     Tenderness: There is no abdominal tenderness. There is no guarding or rebound.  Musculoskeletal:        General: No tenderness.     Cervical back: Normal range of motion and neck supple. No rigidity.  Lymphadenopathy:     Cervical: No cervical adenopathy.  Skin:    Findings: No erythema or rash.  Neurological:     Cranial Nerves: No cranial nerve deficit.     Motor: No abnormal muscle tone.     Coordination: Coordination normal.     Deep Tendon Reflexes: Reflexes normal.  Psychiatric:        Behavior: Behavior normal.        Thought Content: Thought content normal.        Judgment: Judgment normal.     Lab Results  Component Value Date   WBC 8.0 11/08/2022   HGB 14.1 11/08/2022   HCT 41.6 11/08/2022   PLT 233.0 11/08/2022   GLUCOSE 103 (H) 02/23/2023   CHOL 234 (H) 02/23/2023   TRIG 112.0 02/23/2023   HDL 51.90 02/23/2023   LDLDIRECT 162.8 06/28/2011   LDLCALC 160 (H) 02/23/2023   ALT 14 02/23/2023   AST 17 02/23/2023   NA 140 02/23/2023   K 3.9 02/23/2023   CL 102 02/23/2023   CREATININE 0.69 02/23/2023   BUN 10 02/23/2023   CO2 30 02/23/2023   TSH 0.67 11/08/2022   HGBA1C 5.5 01/28/2020    CT CORONARY MORPH W/CTA COR W/SCORE W/CA W/CM &/OR WO/CM  Addendum Date: 03/18/2023   ADDENDUM REPORT: 03/18/2023 02:18 EXAM: OVER-READ INTERPRETATION  CT CHEST  The following report is an over-read performed by radiologist Dr. Aram Candela of [facility] on 03/18/2023. This over-read does not include interpretation of cardiac or coronary anatomy or pathology. The coronary calcium score/coronary CTA interpretation by the cardiologist is attached. COMPARISON:  None.  FINDINGS: Cardiovascular: There are no significant extracardiac vascular findings. Mediastinum/Nodes: There are no enlarged lymph nodes within the visualized mediastinum. Lungs/Pleura: There is no pleural effusion. The visualized lungs appear clear. Upper abdomen: There is a small hiatal hernia. Musculoskeletal/Chest wall: No chest wall mass or suspicious osseous findings within the visualized chest. IMPRESSION: No significant extracardiac findings within the visualized chest. Electronically Signed   By: Aram Candela M.D.   On: 03/18/2023 02:18   Result Date: 03/18/2023 HISTORY: 77 yo female with chest pain, nonspecific EXAM: Cardiac/Coronary CTA TECHNIQUE: The patient was scanned on a [employer]. PROTOCOL: A 120 kV prospective scan was triggered in the descending thoracic aorta at 111 HU's. Axial non-contrast 3 mm slices were carried out through the heart. The data set was analyzed on a dedicated work station and scored using the Agatson method. Gantry rotation speed was 250 msecs and collimation was .6 mm. Beta blockade and 0.8 mg of sl NTG was given. The 3D data set was reconstructed in 5% intervals of the 35-75 % of the R-R cycle. Diastolic phases were analyzed on a dedicated work station using MPR, MIP and VRT modes. The patient received 95mL OMNIPAQUE IOHEXOL 350 MG/ML SOLN contrast. FINDINGS: Quality: Very good, HR 61 Coronary calcium score: The patient's coronary artery calcium score is 317, which places the patient in the 76th percentile. Coronary arteries: Normal coronary origins.  Right dominance. Right Coronary Artery: Dominant. Punctate proximal calcification without  stenosis. Normal R-PDA and R-PLB branches. Left Main Coronary Artery: Normal. Bifurcates into the LAD and LCx arteries. Left Anterior Descending Coronary Artery: Large anterior artery that wraps around the apex. Minimal 1-24% mixed proximal stenosis. The mid to distal vessel is normal. Large proximal D1 branch without disease. Left Circumflex Artery: Large AV groove vessel. Punctate proximal calcification without stenosis. Large OM branch without disease. Aorta: Normal size, 31 mm at the mid ascending aorta (level of the PA bifurcation) measured double oblique. Aortic atherosclerosis. No dissection. Aortic Valve: Trileaflet. No calcifications. Other findings: Normal pulmonary vein drainage into the left atrium. Normal left atrial appendage without a thrombus. Normal size of the pulmonary artery. IMPRESSION: 1. Smooth-appearing coronary arteries with minimal mixed non-obstructive CAD, CADRADS = 1. 2. Coronary artery calcium score is 317, which places the patient in the 76th percentile for age/race and sex-matched control. 3. Normal coronary origin with right dominance. 4. Aortic atherosclerosis. 5. Cardiovascular risk factor modification is advised. Electronically Signed: By: Chrystie Nose M.D. On: 03/13/2023 19:59    Assessment & Plan:   Problem List Items Addressed This Visit     GERD    Cont w/Creon      Relevant Medications   lipase/protease/amylase (CREON) 36000 UNITS CPEP capsule   Hoarseness    Worse Ref to Dr Pollyann Kennedy      Relevant Orders   Ambulatory referral to ENT   CBC with Differential/Platelet   Comprehensive metabolic panel   TSH   Sedimentation rate   Iron deficiency anemia    Can't take iron po lately      Relevant Orders   Iron, TIBC and Ferritin Panel   Weight loss    Wt Readings from Last 3 Encounters:  06/07/23 129 lb (58.5 kg)  02/08/23 136 lb (61.7 kg)  11/08/22 144 lb (65.3 kg)         Other Visit Diagnoses     Dry mouth and eyes    -  Primary    Relevant Medications   Sodium Fluoride 1.1 %  PSTE   Other Relevant Orders   Sjogrens syndrome-A extractable nuclear antibody   Sjogrens syndrome-B extractable nuclear antibody   Ambulatory referral to ENT   CBC with Differential/Platelet   Comprehensive metabolic panel   TSH   Sedimentation rate         Meds ordered this encounter  Medications   Sodium Fluoride 1.1 % PSTE    Sig: Use bid    Dispense:  100 g    Refill:  0   predniSONE (DELTASONE) 10 MG tablet    Sig: Prednisone 10 mg: take 4 tabs a day x 3 days; then 3 tabs a day x 4 days; then 2 tabs a day x 4 days, then 1 tab a day x 6 days, then stop. Take pc.    Dispense:  38 tablet    Refill:  1   lipase/protease/amylase (CREON) 36000 UNITS CPEP capsule    Sig: Take 1 capsule (36,000 Units total) by mouth 3 (three) times daily before meals.    Dispense:  90 capsule    Refill:  11      Follow-up: Return in about 6 weeks (around 07/19/2023) for a follow-up visit.  Sonda Primes, MD

## 2023-06-07 NOTE — Assessment & Plan Note (Signed)
Wt Readings from Last 3 Encounters:  06/07/23 129 lb (58.5 kg)  02/08/23 136 lb (61.7 kg)  11/08/22 144 lb (65.3 kg)

## 2023-06-07 NOTE — Assessment & Plan Note (Signed)
Cont w/Creon

## 2023-06-08 LAB — IRON,TIBC AND FERRITIN PANEL
%SAT: 15 % — ABNORMAL LOW (ref 16–45)
Ferritin: 17 ng/mL (ref 16–288)
Iron: 49 ug/dL (ref 45–160)
TIBC: 327 ug/dL (ref 250–450)

## 2023-06-08 LAB — SJOGRENS SYNDROME-A EXTRACTABLE NUCLEAR ANTIBODY: SSA (Ro) (ENA) Antibody, IgG: <1 AI

## 2023-06-08 LAB — SJOGRENS SYNDROME-B EXTRACTABLE NUCLEAR ANTIBODY: SSB (La) (ENA) Antibody, IgG: <1 AI

## 2023-06-08 LAB — TSH: TSH: 0.56 u[IU]/mL (ref 0.35–5.50)

## 2023-06-09 MED ORDER — POTASSIUM CHLORIDE CRYS ER 20 MEQ PO TBCR: 20.0000 meq | EXTENDED_RELEASE_TABLET | Freq: Every day | ORAL | 3 refills | Status: AC

## 2023-06-09 NOTE — Addendum Note (Signed)
Addended by: Tresa Garter on: 06/09/2023 12:19 AM   Modules accepted: Orders

## 2023-07-05 ENCOUNTER — Other Ambulatory Visit: Payer: Self-pay | Admitting: Internal Medicine

## 2023-07-05 MED ORDER — PREDNISONE 5 MG PO TABS
5.0000 mg | ORAL_TABLET | Freq: Every day | ORAL | 5 refills | Status: DC
Start: 2023-07-05 — End: 2024-07-25

## 2023-07-05 NOTE — Progress Notes (Signed)
Needs Rx

## 2023-07-09 ENCOUNTER — Other Ambulatory Visit: Payer: Self-pay | Admitting: Internal Medicine

## 2023-07-20 ENCOUNTER — Ambulatory Visit: Payer: Medicare HMO | Admitting: Internal Medicine

## 2023-08-08 ENCOUNTER — Other Ambulatory Visit: Payer: Self-pay | Admitting: Internal Medicine

## 2023-08-29 ENCOUNTER — Other Ambulatory Visit: Payer: Self-pay

## 2023-08-29 ENCOUNTER — Telehealth: Payer: Self-pay | Admitting: Internal Medicine

## 2023-08-29 MED ORDER — PANTOPRAZOLE SODIUM 40 MG PO TBEC: 40.0000 mg | DELAYED_RELEASE_TABLET | Freq: Two times a day (BID) | ORAL | 3 refills | Status: DC

## 2023-08-29 NOTE — Telephone Encounter (Signed)
Prescription Request  08/29/2023  LOV: 06/07/2023  What is the name of the medication or equipment?  pantoprazole (PROTONIX) 40 MG tablet [   Have you contacted your pharmacy to request a refill? No   Which pharmacy would you like this sent to?  [id number] [place], [place] - [address] AT [facility] [address] [facility] [address] & [facility] [address] [place] [address] Phone: [phone] Fax: [phone]    Patient notified that their request is being sent to the clinical staff for review and that they should receive a response within 2 business days.   Please advise at Mobile [phone] (mobile)

## 2023-08-31 DIAGNOSIS — H04123 Dry eye syndrome of bilateral lacrimal glands: Secondary | ICD-10-CM | POA: Diagnosis not present

## 2023-08-31 DIAGNOSIS — H04221 Epiphora due to insufficient drainage, right lacrimal gland: Secondary | ICD-10-CM | POA: Diagnosis not present

## 2023-08-31 NOTE — Telephone Encounter (Signed)
Telephone call  

## 2023-10-09 ENCOUNTER — Ambulatory Visit: Payer: Medicare HMO | Admitting: Internal Medicine

## 2023-10-18 ENCOUNTER — Other Ambulatory Visit: Payer: Self-pay | Admitting: Internal Medicine

## 2023-10-19 ENCOUNTER — Encounter: Payer: Self-pay | Admitting: Internal Medicine

## 2023-10-19 ENCOUNTER — Ambulatory Visit (INDEPENDENT_AMBULATORY_CARE_PROVIDER_SITE_OTHER): Payer: Medicare HMO | Admitting: Internal Medicine

## 2023-10-19 VITALS — BP 126/78 | HR 76 | Temp 98.2°F | Ht 61.0 in | Wt 141.2 lb

## 2023-10-19 DIAGNOSIS — F331 Major depressive disorder, recurrent, moderate: Secondary | ICD-10-CM | POA: Diagnosis not present

## 2023-10-19 DIAGNOSIS — F17200 Nicotine dependence, unspecified, uncomplicated: Secondary | ICD-10-CM

## 2023-10-19 DIAGNOSIS — J449 Chronic obstructive pulmonary disease, unspecified: Secondary | ICD-10-CM | POA: Diagnosis not present

## 2023-10-19 DIAGNOSIS — F4323 Adjustment disorder with mixed anxiety and depressed mood: Secondary | ICD-10-CM | POA: Diagnosis not present

## 2023-10-19 DIAGNOSIS — R7309 Other abnormal glucose: Secondary | ICD-10-CM

## 2023-10-19 LAB — CBC WITH DIFFERENTIAL/PLATELET
Basophils Absolute: 0.1 10*3/uL (ref 0.0–0.1)
Basophils Relative: 0.9 % (ref 0.0–3.0)
Eosinophils Absolute: 0.3 10*3/uL (ref 0.0–0.7)
Eosinophils Relative: 4.2 % (ref 0.0–5.0)
HCT: 43 % (ref 36.0–46.0)
Hemoglobin: 14 g/dL (ref 12.0–15.0)
Lymphocytes Relative: 30.1 % (ref 12.0–46.0)
Lymphs Abs: 2.1 10*3/uL (ref 0.7–4.0)
MCHC: 32.5 g/dL (ref 30.0–36.0)
MCV: 84.2 fL (ref 78.0–100.0)
Monocytes Absolute: 0.5 10*3/uL (ref 0.1–1.0)
Monocytes Relative: 7.1 % (ref 3.0–12.0)
Neutro Abs: 4 10*3/uL (ref 1.4–7.7)
Neutrophils Relative %: 57.7 % (ref 43.0–77.0)
Platelets: 218 10*3/uL (ref 150.0–400.0)
RBC: 5.1 Mil/uL (ref 3.87–5.11)
RDW: 15.1 % (ref 11.5–15.5)
WBC: 6.9 10*3/uL (ref 4.0–10.5)

## 2023-10-19 LAB — COMPREHENSIVE METABOLIC PANEL
ALT: 9 U/L (ref 0–35)
AST: 14 U/L (ref 0–37)
Albumin: 4.6 g/dL (ref 3.5–5.2)
Alkaline Phosphatase: 69 U/L (ref 39–117)
BUN: 14 mg/dL (ref 6–23)
CO2: 29 meq/L (ref 19–32)
Calcium: 9.8 mg/dL (ref 8.4–10.5)
Chloride: 104 meq/L (ref 96–112)
Creatinine, Ser: 0.66 mg/dL (ref 0.40–1.20)
GFR: 84.17 mL/min (ref >60.00)
Glucose, Bld: 90 mg/dL (ref 70–99)
Potassium: 3.9 meq/L (ref 3.5–5.1)
Sodium: 142 meq/L (ref 135–145)
Total Bilirubin: 0.5 mg/dL (ref 0.2–1.2)
Total Protein: 7.4 g/dL (ref 6.0–8.3)

## 2023-10-19 LAB — TSH: TSH: 0.98 u[IU]/mL (ref 0.35–5.50)

## 2023-10-19 MED ORDER — ALPRAZOLAM 0.25 MG PO TABS: 0.2500 mg | ORAL_TABLET | Freq: Two times a day (BID) | ORAL | 1 refills | Status: AC | PRN

## 2023-10-19 NOTE — Progress Notes (Signed)
 Subjective:  Patient ID: Theresa Aguirre, female    DOB: [birth year]  Age: 78 y.o. MRN: [id number]  CC: Annual Exam (Check up, TB blood test)   HPI Braylon Lemmons presents for - needs TB test F/u on COPD, OA, LBP  Outpatient Medications Prior to Visit  Medication Sig Dispense Refill   aspirin  (BAYER ASPIRIN ) 325 MG tablet Take 0.5 tablets (162 mg total) by mouth daily. 100 tablet 3   b complex vitamins tablet Take 1 tablet by mouth daily. 100 tablet 3   buPROPion  (WELLBUTRIN  XL) 150 MG 24 hr tablet TAKE 1 TABLET(150 MG) BY MOUTH IN THE MORNING 90 tablet 1   clobetasol  cream (TEMOVATE ) 0.05 % Apply 1 application topically 2 (two) times daily. 60 g 3   cloNIDine  (CATAPRES ) 0.1 MG tablet Take 1 tablet (0.1 mg total) by mouth 3 (three) times daily as needed (take if top blood pressure is over 180). 90 tablet 3   furosemide  (LASIX ) 20 MG tablet TAKE 1 TABLET(20 MG) BY MOUTH TWICE DAILY AS NEEDED FOR SWELLING 90 tablet 1   hydrochlorothiazide  (MICROZIDE ) 12.5 MG capsule TAKE 1 CAPSULE(12.5 MG) BY MOUTH DAILY 90 capsule 3   ketotifen  (ZADITOR ) 0.025 % ophthalmic solution Place 2 drops into both eyes 2 (two) times daily. 5 mL 1   levalbuterol  (XOPENEX  HFA) 45 MCG/ACT inhaler INHALE 2 PUFFS INTO THE LUNGS EVERY 6 HOURS AS NEEDED FOR WHEEZING OR SHORTNESS OF BREATH 45 g 3   lipase/protease/amylase (CREON ) 36000 UNITS CPEP capsule Take 1 capsule (36,000 Units total) by mouth 3 (three) times daily before meals. 90 capsule 11   loratadine  (CLARITIN ) 10 MG tablet TAKE 1 TABLET(10 MG) BY MOUTH DAILY 90 tablet 3   metoprolol  tartrate (LOPRESSOR ) 50 MG tablet Take 2 hours prior to procedure 1 tablet 0   oxymetazoline  (AFRIN 12 HOUR) 0.05 % nasal spray Place 1 spray into both nostrils 2 (two) times daily. No longer than 5 days 30 mL 1   pantoprazole  (PROTONIX ) 40 MG tablet Take 1 tablet (40 mg total) by mouth 2 (two) times daily. 60 tablet 3   potassium chloride  SA (KLOR-CON  M) 20 MEQ tablet Take 1 tablet  (20 mEq total) by mouth daily. 30 tablet 3   predniSONE  (DELTASONE ) 5 MG tablet Take 1 tablet (5 mg total) by mouth daily with breakfast. 30 tablet 5   roflumilast  (DALIRESP ) 500 MCG TABS tablet Take 1 tablet (500 mcg total) by mouth daily. 30 tablet 11   Simethicone (GAS-X PO) Take by mouth.     Sodium Fluoride  1.1 % PSTE Use bid 100 g 0   temazepam  (RESTORIL ) 15 MG capsule TAKE 1 TO 2 CAPSULES BY MOUTH EVERY NIGHT AT BEDTIME AS NEEDED 60 capsule 3   Tiotropium Bromide  Monohydrate (SPIRIVA  RESPIMAT) 2.5 MCG/ACT AERS Inhale 2 puffs into the lungs daily. 4 g 11   No facility-administered medications prior to visit.    ROS: Review of Systems  Constitutional:  Negative for activity change, appetite change, chills, fatigue and unexpected weight change.  HENT:  Negative for congestion, mouth sores and sinus pressure.   Eyes:  Negative for visual disturbance.  Respiratory:  Positive for cough and wheezing. Negative for chest tightness.   Gastrointestinal:  Negative for abdominal pain and nausea.  Genitourinary:  Negative for difficulty urinating, frequency and vaginal pain.  Musculoskeletal:  Positive for arthralgias and back pain. Negative for gait problem.  Skin:  Negative for pallor and rash.  Neurological:  Negative for dizziness, tremors, weakness, numbness  and headaches.  Psychiatric/Behavioral:  Negative for confusion and sleep disturbance.     Objective:  BP 126/78   Pulse 76   Temp 98.2 F (36.8 C)   Ht 5' 1 (1.549 m)   Wt 141 lb 3.2 oz (64 kg)   SpO2 99%   BMI 26.68 kg/m   BP Readings from Last 3 Encounters:  10/19/23 126/78  06/07/23 120/72  03/13/23 121/77    Wt Readings from Last 3 Encounters:  10/19/23 141 lb 3.2 oz (64 kg)  06/07/23 129 lb (58.5 kg)  02/08/23 136 lb (61.7 kg)    Physical Exam Constitutional:      General: She is not in acute distress.    Appearance: She is well-developed.  HENT:     Head: Normocephalic.     Right Ear: External ear  normal.     Left Ear: External ear normal.     Nose: Nose normal.  Eyes:     General:        Right eye: No discharge.        Left eye: No discharge.     Conjunctiva/sclera: Conjunctivae normal.     Pupils: Pupils are equal, round, and reactive to light.  Neck:     Thyroid : No thyromegaly.     Vascular: No JVD.     Trachea: No tracheal deviation.  Cardiovascular:     Rate and Rhythm: Normal rate and regular rhythm.     Heart sounds: Normal heart sounds.  Pulmonary:     Effort: No respiratory distress.     Breath sounds: No stridor. No wheezing.  Abdominal:     General: Bowel sounds are normal. There is no distension.     Palpations: Abdomen is soft. There is no mass.     Tenderness: There is no abdominal tenderness. There is no guarding or rebound.  Musculoskeletal:        General: No tenderness.     Cervical back: Normal range of motion and neck supple. No rigidity.  Lymphadenopathy:     Cervical: No cervical adenopathy.  Skin:    Findings: No erythema or rash.  Neurological:     Cranial Nerves: No cranial nerve deficit.     Motor: No abnormal muscle tone.     Coordination: Coordination normal.     Deep Tendon Reflexes: Reflexes normal.  Psychiatric:        Behavior: Behavior normal.        Thought Content: Thought content normal.        Judgment: Judgment normal.     Lab Results  Component Value Date   WBC 7.5 06/07/2023   HGB 13.5 06/07/2023   HCT 42.0 06/07/2023   PLT 237.0 06/07/2023   GLUCOSE 94 06/07/2023   CHOL 234 (H) 02/23/2023   TRIG 112.0 02/23/2023   HDL 51.90 02/23/2023   LDLDIRECT 162.8 06/28/2011   LDLCALC 160 (H) 02/23/2023   ALT 13 06/07/2023   AST 16 06/07/2023   NA 142 06/07/2023   K 3.2 (L) 06/07/2023   CL 101 06/07/2023   CREATININE 0.68 06/07/2023   BUN 13 06/07/2023   CO2 32 06/07/2023   TSH 0.56 06/07/2023   HGBA1C 5.5 01/28/2020    CT CORONARY MORPH W/CTA COR W/SCORE W/CA W/CM &/OR WO/CM Addendum Date: 03/18/2023 ADDENDUM  REPORT: 03/18/2023 02:18 EXAM: OVER-READ INTERPRETATION  CT CHEST The following report is an over-read performed by radiologist Dr. Suzen Dials of [facility] on 03/18/2023. This over-read does not include  interpretation of cardiac or coronary anatomy or pathology. The coronary calcium  score/coronary CTA interpretation by the cardiologist is attached. COMPARISON:  None. FINDINGS: Cardiovascular: There are no significant extracardiac vascular findings. Mediastinum/Nodes: There are no enlarged lymph nodes within the visualized mediastinum. Lungs/Pleura: There is no pleural effusion. The visualized lungs appear clear. Upper abdomen: There is a small hiatal hernia. Musculoskeletal/Chest wall: No chest wall mass or suspicious osseous findings within the visualized chest. IMPRESSION: No significant extracardiac findings within the visualized chest. Electronically Signed   By: Suzen Dials M.D.   On: 03/18/2023 02:18   Result Date: 03/18/2023 HISTORY: 78 yo female with chest pain, nonspecific EXAM: Cardiac/Coronary CTA TECHNIQUE: The patient was scanned on a [employer]. PROTOCOL: A 120 kV prospective scan was triggered in the descending thoracic aorta at 111 HU's. Axial non-contrast 3 mm slices were carried out through the heart. The data set was analyzed on a dedicated work station and scored using the Agatson method. Gantry rotation speed was 250 msecs and collimation was .6 mm. Beta blockade and 0.8 mg of sl NTG was given. The 3D data set was reconstructed in 5% intervals of the 35-75 % of the R-R cycle. Diastolic phases were analyzed on a dedicated work station using MPR, MIP and VRT modes. The patient received 95mL OMNIPAQUE  IOHEXOL  350 MG/ML SOLN contrast. FINDINGS: Quality: Very good, HR 61 Coronary calcium  score: The patient's coronary artery calcium  score is 317, which places the patient in the 76th percentile. Coronary arteries: Normal coronary origins.  Right dominance. Right  Coronary Artery: Dominant. Punctate proximal calcification without stenosis. Normal R-PDA and R-PLB branches. Left Main Coronary Artery: Normal. Bifurcates into the LAD and LCx arteries. Left Anterior Descending Coronary Artery: Large anterior artery that wraps around the apex. Minimal 1-24% mixed proximal stenosis. The mid to distal vessel is normal. Large proximal D1 branch without disease. Left Circumflex Artery: Large AV groove vessel. Punctate proximal calcification without stenosis. Large OM branch without disease. Aorta: Normal size, 31 mm at the mid ascending aorta (level of the PA bifurcation) measured double oblique. Aortic atherosclerosis. No dissection. Aortic Valve: Trileaflet. No calcifications. Other findings: Normal pulmonary vein drainage into the left atrium. Normal left atrial appendage without a thrombus. Normal size of the pulmonary artery. IMPRESSION: 1. Smooth-appearing coronary arteries with minimal mixed non-obstructive CAD, CADRADS = 1. 2. Coronary artery calcium  score is 317, which places the patient in the 76th percentile for age/race and sex-matched control. 3. Normal coronary origin with right dominance. 4. Aortic atherosclerosis. 5. Cardiovascular risk factor modification is advised. Electronically Signed: By: Vinie JAYSON Maxcy M.D. On: 03/13/2023 19:59    Assessment & Plan:   Problem List Items Addressed This Visit     Adjustment disorder with mixed anxiety and depressed mood   On Wellbutrin  XL qd      TOBACCO USE DISORDER/SMOKER-SMOKING CESSATION DISCUSSED   Gold test Vaping now      Relevant Orders   QuantiFERON-TB Gold Plus   Major depressive disorder, recurrent episode, moderate (HCC)   Discussed stress - dog is sick w/CHF - died      Relevant Orders   CBC with Differential/Platelet   Comprehensive metabolic panel   TSH   QuantiFERON-TB Gold Plus   COPD, moderate (HCC)   On Daliresp   Walk more      Relevant Orders   CBC with Differential/Platelet    Comprehensive metabolic panel   TSH   QuantiFERON-TB Gold Plus   HYPERGLYCEMIA - Primary   A1c TSH  No orders of the defined types were placed in this encounter.     Follow-up: Return in about 3 months (around 01/17/2024) for a follow-up visit.  Marolyn Noel, MD

## 2023-10-19 NOTE — Assessment & Plan Note (Signed)
A1c TSH

## 2023-10-19 NOTE — Assessment & Plan Note (Signed)
 Discussed stress - dog is sick w/CHF - died

## 2023-10-19 NOTE — Addendum Note (Signed)
 Addended by: Tresa Garter on: 10/19/2023 03:17 PM   Modules accepted: Orders

## 2023-10-19 NOTE — Assessment & Plan Note (Signed)
 On Daliresp  Walk more

## 2023-10-19 NOTE — Assessment & Plan Note (Addendum)
 Gold test Vaping now

## 2023-10-19 NOTE — Assessment & Plan Note (Signed)
On Wellbutrin XL qd

## 2023-10-22 LAB — QUANTIFERON-TB GOLD PLUS
Mitogen-NIL: >10 [IU]/mL
NIL: 0.03 [IU]/mL
QuantiFERON-TB Gold Plus: POSITIVE — AB
TB1-NIL: 2.28 [IU]/mL
TB2-NIL: 2.29 [IU]/mL

## 2023-11-20 ENCOUNTER — Other Ambulatory Visit: Payer: Self-pay | Admitting: Internal Medicine

## 2023-11-20 DIAGNOSIS — R7612 Nonspecific reaction to cell mediated immunity measurement of gamma interferon antigen response without active tuberculosis: Secondary | ICD-10-CM | POA: Insufficient documentation

## 2023-11-24 ENCOUNTER — Ambulatory Visit: Payer: Medicare HMO

## 2023-11-24 ENCOUNTER — Other Ambulatory Visit: Payer: Medicare HMO

## 2023-11-24 DIAGNOSIS — J449 Chronic obstructive pulmonary disease, unspecified: Secondary | ICD-10-CM | POA: Diagnosis not present

## 2023-11-24 DIAGNOSIS — E785 Hyperlipidemia, unspecified: Secondary | ICD-10-CM | POA: Diagnosis not present

## 2023-11-24 DIAGNOSIS — R7612 Nonspecific reaction to cell mediated immunity measurement of gamma interferon antigen response without active tuberculosis: Secondary | ICD-10-CM | POA: Diagnosis not present

## 2023-11-24 DIAGNOSIS — I700 Atherosclerosis of aorta: Secondary | ICD-10-CM | POA: Diagnosis not present

## 2023-11-25 LAB — LIPID PANEL
Chol/HDL Ratio: 3.8 {ratio} (ref 0.0–4.4)
Cholesterol, Total: 228 mg/dL — ABNORMAL HIGH (ref 100–199)
HDL: 60 mg/dL (ref >39)
LDL Chol Calc (NIH): 152 mg/dL — ABNORMAL HIGH (ref 0–99)
Triglycerides: 89 mg/dL (ref 0–149)
VLDL Cholesterol Cal: 16 mg/dL (ref 5–40)

## 2023-11-25 LAB — HEPATIC FUNCTION PANEL
ALT: 8 [IU]/L (ref 0–32)
AST: 13 [IU]/L (ref 0–40)
Albumin: 4.6 g/dL (ref 3.8–4.8)
Alkaline Phosphatase: 87 [IU]/L (ref 44–121)
Bilirubin Total: 0.6 mg/dL (ref 0.0–1.2)
Bilirubin, Direct: 0.17 mg/dL (ref 0.00–0.40)
Total Protein: 6.8 g/dL (ref 6.0–8.5)

## 2023-11-25 LAB — SPECIMEN STATUS REPORT

## 2023-12-01 ENCOUNTER — Ambulatory Visit: Payer: Medicare HMO | Admitting: Internal Medicine

## 2023-12-11 ENCOUNTER — Encounter: Payer: Self-pay | Admitting: Internal Medicine

## 2023-12-12 NOTE — Telephone Encounter (Unsigned)
 Copied from CRM [id number]. Topic: Clinical - Lab/Test Results >> Dec 12, 2023  2:53 PM Theresa Aguirre wrote: Reason for CRM: Patient is returning call to clinic regarding lab results, per message. Agent provided patient with x ray results as stated by provider and patient acknowledged. Please reach out to patient regarding lab results, thanks.  Theresa Aguirre [phone]

## 2023-12-14 DIAGNOSIS — H5203 Hypermetropia, bilateral: Secondary | ICD-10-CM | POA: Diagnosis not present

## 2023-12-15 ENCOUNTER — Telehealth: Payer: Self-pay

## 2023-12-15 ENCOUNTER — Other Ambulatory Visit: Payer: Self-pay | Admitting: Internal Medicine

## 2023-12-15 DIAGNOSIS — R7612 Nonspecific reaction to cell mediated immunity measurement of gamma interferon antigen response without active tuberculosis: Secondary | ICD-10-CM

## 2023-12-15 NOTE — Telephone Encounter (Signed)
 Pt has to contact the office where she had blood work done as we did not do those labs from 11/24/2023.

## 2023-12-15 NOTE — Telephone Encounter (Signed)
 Spoke with the pt and she has stated she is needing her Theresa Aguirre labs resulted and printed a/w chest x-ray results.  Chest x-ray was resulted by PCP and lab was contacted due to [facility] labs not in as of yet.

## 2023-12-15 NOTE — Telephone Encounter (Signed)
 Copied from CRM [id number]. Topic: Clinical - Lab/Test Results >> Dec 15, 2023 11:06 AM Theresa Aguirre wrote: Reason for CRM: Patient has requested for the providers to mail her lab results from 11/24/2023 to her residence: [address] [place] [address]  Patient has requested a follow up call after her lab results have been mailed off #[phone]

## 2023-12-21 ENCOUNTER — Other Ambulatory Visit: Payer: Self-pay | Admitting: Internal Medicine

## 2023-12-28 NOTE — Telephone Encounter (Signed)
 Copied from CRM [id number]. Topic: General - Other >> Dec 28, 2023  3:29 PM Theresa Aguirre wrote: Reason for CRM: Patient is calling to check if the copy of her lab results & x-ray are ready to be picked up, stating she has been waiting since 3/7.  Patient is requesting Aguirre call back as soon as possible, her job is requiring the results.

## 2023-12-29 ENCOUNTER — Other Ambulatory Visit: Payer: Self-pay | Admitting: Internal Medicine

## 2023-12-29 NOTE — Telephone Encounter (Signed)
 Both have been printed and left up front for pt to pick up.

## 2024-01-08 ENCOUNTER — Ambulatory Visit (INDEPENDENT_AMBULATORY_CARE_PROVIDER_SITE_OTHER): Admitting: Internal Medicine

## 2024-01-08 ENCOUNTER — Encounter: Payer: Self-pay | Admitting: Internal Medicine

## 2024-01-08 ENCOUNTER — Ambulatory Visit: Admitting: Internal Medicine

## 2024-01-08 VITALS — BP 120/84 | HR 90 | Temp 97.7°F | Ht 61.0 in | Wt 139.6 lb

## 2024-01-08 DIAGNOSIS — D509 Iron deficiency anemia, unspecified: Secondary | ICD-10-CM | POA: Diagnosis not present

## 2024-01-08 DIAGNOSIS — J449 Chronic obstructive pulmonary disease, unspecified: Secondary | ICD-10-CM

## 2024-01-08 DIAGNOSIS — F4323 Adjustment disorder with mixed anxiety and depressed mood: Secondary | ICD-10-CM | POA: Diagnosis not present

## 2024-01-08 DIAGNOSIS — L219 Seborrheic dermatitis, unspecified: Secondary | ICD-10-CM | POA: Insufficient documentation

## 2024-01-08 DIAGNOSIS — R7612 Nonspecific reaction to cell mediated immunity measurement of gamma interferon antigen response without active tuberculosis: Secondary | ICD-10-CM

## 2024-01-08 MED ORDER — ROFLUMILAST 250 MCG PO TABS: 1.0000 | ORAL_TABLET | Freq: Every day | ORAL | 3 refills | Status: AC

## 2024-01-08 MED ORDER — RIFAMPIN 300 MG PO CAPS: 600.0000 mg | ORAL_CAPSULE | Freq: Every day | ORAL | 3 refills | Status: DC

## 2024-01-08 MED ORDER — TEMAZEPAM 15 MG PO CAPS: ORAL_CAPSULE | ORAL | 3 refills | Status: DC

## 2024-01-08 NOTE — Progress Notes (Unsigned)
 Subjective:  Patient ID: Theresa Aguirre, female    DOB: March 15, 1946  Age: 78 y.o. MRN: [id number]  CC: Hypertension (Previous TB test concerns. BP concerns due to stopping medication (roflumilast) due to shivering, feeling of being cold, and muscle spasms. Would like to repeat TB test/review last set of TB results. Swelling in left ankle)   HPI Theresa Aguirre presents for TB test concerns. BP concerns due to stopping medication (roflumilast) due to shivering, feeling of being cold, and muscle spasms. Would like to repeat TB test/review last set of TB results. C/o swelling in left ankle. C/o SOB x 1 d - Deliresp helps (re-started in Feb 2025)  TB God test was (+), CXR was (-)  Outpatient Medications Prior to Visit  Medication Sig Dispense Refill   ALPRAZolam (XANAX) 0.25 MG tablet Take 1 tablet (0.25 mg total) by mouth 2 (two) times daily as needed for anxiety. 30 tablet 1   aspirin (BAYER ASPIRIN) 325 MG tablet Take 0.5 tablets (162 mg total) by mouth daily. 100 tablet 3   b complex vitamins tablet Take 1 tablet by mouth daily. 100 tablet 3   buPROPion (WELLBUTRIN XL) 150 MG 24 hr tablet TAKE 1 TABLET(150 MG) BY MOUTH IN THE MORNING 90 tablet 1   clobetasol cream (TEMOVATE) 0.05 % Apply 1 application topically 2 (two) times daily. 60 g 3   cloNIDine (CATAPRES) 0.1 MG tablet Take 1 tablet (0.1 mg total) by mouth 3 (three) times daily as needed (take if top blood pressure is over 180). 90 tablet 3   furosemide (LASIX) 20 MG tablet TAKE 1 TABLET(20 MG) BY MOUTH TWICE DAILY AS NEEDED FOR SWELLING 90 tablet 1   hydrochlorothiazide (MICROZIDE) 12.5 MG capsule TAKE 1 CAPSULE(12.5 MG) BY MOUTH DAILY 90 capsule 3   ketotifen (ZADITOR) 0.025 % ophthalmic solution Place 2 drops into both eyes 2 (two) times daily. 5 mL 1   levalbuterol (XOPENEX HFA) 45 MCG/ACT inhaler INHALE 2 PUFFS INTO THE LUNGS EVERY 6 HOURS AS NEEDED FOR WHEEZING OR SHORTNESS OF BREATH 45 g 3   lipase/protease/amylase (CREON) 36000  UNITS CPEP capsule Take 1 capsule (36,000 Units total) by mouth 3 (three) times daily before meals. 90 capsule 11   loratadine (CLARITIN) 10 MG tablet TAKE 1 TABLET(10 MG) BY MOUTH DAILY 90 tablet 3   metoprolol tartrate (LOPRESSOR) 50 MG tablet Take 2 hours prior to procedure 1 tablet 0   oxymetazoline (AFRIN 12 HOUR) 0.05 % nasal spray Place 1 spray into both nostrils 2 (two) times daily. No longer than 5 days 30 mL 1   pantoprazole (PROTONIX) 40 MG tablet TAKE 1 TABLET(40 MG) BY MOUTH TWICE DAILY 180 tablet 0   potassium chloride SA (KLOR-CON M) 20 MEQ tablet Take 1 tablet (20 mEq total) by mouth daily. 30 tablet 3   predniSONE (DELTASONE) 5 MG tablet Take 1 tablet (5 mg total) by mouth daily with breakfast. 30 tablet 5   Simethicone (GAS-X PO) Take by mouth.     Sodium Fluoride 1.1 % PSTE Use bid 100 g 0   Tiotropium Bromide Monohydrate (SPIRIVA RESPIMAT) 2.5 MCG/ACT AERS Inhale 2 puffs into the lungs daily. 4 g 11   roflumilast (DALIRESP) 500 MCG TABS tablet Take 1 tablet (500 mcg total) by mouth daily. 30 tablet 11   temazepam (RESTORIL) 15 MG capsule TAKE 1 TO 2 CAPSULES BY MOUTH EVERY NIGHT AT BEDTIME AS NEEDED 60 capsule 3   No facility-administered medications prior to visit.    ROS:  Review of Systems  Constitutional:  Positive for fatigue. Negative for activity change, appetite change, chills and unexpected weight change.  HENT:  Positive for congestion. Negative for mouth sores, sinus pressure and voice change.   Eyes:  Negative for visual disturbance.  Respiratory:  Positive for shortness of breath and wheezing. Negative for cough and chest tightness.   Gastrointestinal:  Negative for abdominal pain and nausea.  Genitourinary:  Negative for difficulty urinating, frequency and vaginal pain.  Musculoskeletal:  Positive for arthralgias and back pain. Negative for gait problem.  Skin:  Negative for pallor and rash.  Neurological:  Negative for dizziness, tremors, weakness, numbness  and headaches.  Hematological:  Bruises/bleeds easily.  Psychiatric/Behavioral:  Positive for decreased concentration and sleep disturbance. Negative for confusion and suicidal ideas. The patient is nervous/anxious.     Objective:  BP 120/84   Pulse 90   Temp 97.7 F (36.5 C)   Ht 5\' 1"  (1.549 m)   Wt 139 lb 9.6 oz (63.3 kg)   SpO2 95%   BMI 26.38 kg/m   BP Readings from Last 3 Encounters:  01/08/24 120/84  10/19/23 126/78  06/07/23 120/72    Wt Readings from Last 3 Encounters:  01/08/24 139 lb 9.6 oz (63.3 kg)  10/19/23 141 lb 3.2 oz (64 kg)  06/07/23 129 lb (58.5 kg)    Physical Exam Constitutional:      General: She is not in acute distress.    Appearance: She is well-developed.  HENT:     Head: Normocephalic.     Right Ear: External ear normal.     Left Ear: External ear normal.     Nose: Nose normal.  Eyes:     General:        Right eye: No discharge.        Left eye: No discharge.     Conjunctiva/sclera: Conjunctivae normal.     Pupils: Pupils are equal, round, and reactive to light.  Neck:     Thyroid: No thyromegaly.     Vascular: No JVD.     Trachea: No tracheal deviation.  Cardiovascular:     Rate and Rhythm: Normal rate and regular rhythm.     Heart sounds: Normal heart sounds.  Pulmonary:     Effort: No respiratory distress.     Breath sounds: No stridor. No wheezing.  Abdominal:     General: Bowel sounds are normal. There is no distension.     Palpations: Abdomen is soft. There is no mass.     Tenderness: There is no abdominal tenderness. There is no guarding or rebound.  Musculoskeletal:        General: No tenderness.     Cervical back: Normal range of motion and neck supple. No rigidity.  Lymphadenopathy:     Cervical: No cervical adenopathy.  Skin:    Findings: No erythema or rash.  Neurological:     Cranial Nerves: No cranial nerve deficit.     Motor: No abnormal muscle tone.     Coordination: Coordination normal.     Gait: Gait  normal.     Deep Tendon Reflexes: Reflexes normal.  Psychiatric:        Behavior: Behavior normal.        Thought Content: Thought content normal.        Judgment: Judgment normal.   SD rash    A total time of 45 minutes was spent preparing to see the patient, reviewing tests, x-rays, operative reports and other  medical records.  Also, obtaining history and performing comprehensive physical exam.  Additionally, counseling the patient regarding the above listed issues positive TB test, COPD, SD.   Finally, documenting clinical information in the health records, coordination of care, educating the patient. It is a complex case.  Lab Results  Component Value Date   WBC 6.9 10/19/2023   HGB 14.0 10/19/2023   HCT 43.0 10/19/2023   PLT 218.0 10/19/2023   GLUCOSE 90 10/19/2023   CHOL 228 (H) 11/24/2023   TRIG 89 11/24/2023   HDL 60 11/24/2023   LDLDIRECT 162.8 06/28/2011   LDLCALC 152 (H) 11/24/2023   ALT 8 11/24/2023   AST 13 11/24/2023   NA 142 10/19/2023   K 3.9 10/19/2023   CL 104 10/19/2023   CREATININE 0.66 10/19/2023   BUN 14 10/19/2023   CO2 29 10/19/2023   TSH 0.98 10/19/2023   HGBA1C 5.5 01/28/2020    CT CORONARY MORPH W/CTA COR W/SCORE W/CA W/CM &/OR WO/CM Addendum Date: 03/18/2023 ADDENDUM REPORT: 03/18/2023 02:18 EXAM: OVER-READ INTERPRETATION  CT CHEST The following report is an over-read performed by radiologist Dr. Aram Candela of [facility] on 03/18/2023. This over-read does not include interpretation of cardiac or coronary anatomy or pathology. The coronary calcium score/coronary CTA interpretation by the cardiologist is attached. COMPARISON:  None. FINDINGS: Cardiovascular: There are no significant extracardiac vascular findings. Mediastinum/Nodes: There are no enlarged lymph nodes within the visualized mediastinum. Lungs/Pleura: There is no pleural effusion. The visualized lungs appear clear. Upper abdomen: There is a small hiatal hernia.  Musculoskeletal/Chest wall: No chest wall mass or suspicious osseous findings within the visualized chest. IMPRESSION: No significant extracardiac findings within the visualized chest. Electronically Signed   By: Aram Candela M.D.   On: 03/18/2023 02:18   Result Date: 03/18/2023 HISTORY: 78 yo female with chest pain, nonspecific EXAM: Cardiac/Coronary CTA TECHNIQUE: The patient was scanned on a [employer]. PROTOCOL: A 120 kV prospective scan was triggered in the descending thoracic aorta at 111 HU's. Axial non-contrast 3 mm slices were carried out through the heart. The data set was analyzed on a dedicated work station and scored using the Agatson method. Gantry rotation speed was 250 msecs and collimation was .6 mm. Beta blockade and 0.8 mg of sl NTG was given. The 3D data set was reconstructed in 5% intervals of the 35-75 % of the R-R cycle. Diastolic phases were analyzed on a dedicated work station using MPR, MIP and VRT modes. The patient received 95mL OMNIPAQUE IOHEXOL 350 MG/ML SOLN contrast. FINDINGS: Quality: Very good, HR 61 Coronary calcium score: The patient's coronary artery calcium score is 317, which places the patient in the 76th percentile. Coronary arteries: Normal coronary origins.  Right dominance. Right Coronary Artery: Dominant. Punctate proximal calcification without stenosis. Normal R-PDA and R-PLB branches. Left Main Coronary Artery: Normal. Bifurcates into the LAD and LCx arteries. Left Anterior Descending Coronary Artery: Large anterior artery that wraps around the apex. Minimal 1-24% mixed proximal stenosis. The mid to distal vessel is normal. Large proximal D1 branch without disease. Left Circumflex Artery: Large AV groove vessel. Punctate proximal calcification without stenosis. Large OM branch without disease. Aorta: Normal size, 31 mm at the mid ascending aorta (level of the PA bifurcation) measured double oblique. Aortic atherosclerosis. No dissection. Aortic Valve:  Trileaflet. No calcifications. Other findings: Normal pulmonary vein drainage into the left atrium. Normal left atrial appendage without a thrombus. Normal size of the pulmonary artery. IMPRESSION: 1. Smooth-appearing coronary arteries with  minimal mixed non-obstructive CAD, CADRADS = 1. 2. Coronary artery calcium score is 317, which places the patient in the 76th percentile for age/race and sex-matched control. 3. Normal coronary origin with right dominance. 4. Aortic atherosclerosis. 5. Cardiovascular risk factor modification is advised. Electronically Signed: By: Chrystie Nose M.D. On: 03/13/2023 19:59    Assessment & Plan:   Problem List Items Addressed This Visit     Adjustment disorder with mixed anxiety and depressed mood   Worse due to multiple factors. On Wellbutrin XL qd.  Continue with as needed alprazolam      COPD, moderate (HCC)   Relevant Medications   Roflumilast 250 MCG TABS   Iron deficiency anemia   Relevant Orders   Iron, TIBC and Ferritin Panel   Positive QuantiFERON-TB Gold test - Primary   Relevant Orders   QuantiFERON-TB Gold Plus   Comprehensive metabolic panel with GFR   CBC with Differential/Platelet   Seborrheic dermatitis   Refractory Rx - Zoryve foam 0.3%         Meds ordered this encounter  Medications   Roflumilast 250 MCG TABS    Sig: Take 1 tablet by mouth daily.    Dispense:  90 tablet    Refill:  3   rifampin (RIFADIN) 300 MG capsule    Sig: Take 2 capsules (600 mg total) by mouth daily.    Dispense:  60 capsule    Refill:  3   temazepam (RESTORIL) 15 MG capsule    Sig: TAKE 1 TO 2 CAPSULES BY MOUTH EVERY NIGHT AT BEDTIME AS NEEDED    Dispense:  60 capsule    Refill:  3   DISCONTD: Roflumilast, Antiseborrheic, (ZORYVE) 0.3 % FOAM    Sig: Apply 1 Act topically daily.    Dispense:  60 g    Refill:  2    Dx: L21.8. Pt failed steroid creams   Roflumilast, Antiseborrheic, (ZORYVE) 0.3 % FOAM    Sig: Apply 1 Act topically daily.     Dispense:  60 g    Refill:  2    Dx: L21.8. Pt failed steroid creams      Follow-up: Return in about 2 months (around 03/09/2024) for a follow-up visit.  Sonda Primes, MD

## 2024-01-08 NOTE — Assessment & Plan Note (Signed)
 Refractory Rx - Zoryve foam 0.3%

## 2024-01-08 NOTE — Assessment & Plan Note (Signed)
 Worse due to multiple factors. On Wellbutrin XL qd.  Continue with as needed alprazolam

## 2024-01-09 LAB — CBC WITH DIFFERENTIAL/PLATELET
Basophils Absolute: 0.1 10*3/uL (ref 0.0–0.1)
Basophils Relative: 0.9 % (ref 0.0–3.0)
Eosinophils Absolute: 0.2 10*3/uL (ref 0.0–0.7)
Eosinophils Relative: 3.9 % (ref 0.0–5.0)
HCT: 45.6 % (ref 36.0–46.0)
Hemoglobin: 15 g/dL (ref 12.0–15.0)
Lymphocytes Relative: 34.6 % (ref 12.0–46.0)
Lymphs Abs: 2.2 10*3/uL (ref 0.7–4.0)
MCHC: 32.9 g/dL (ref 30.0–36.0)
MCV: 86 fl (ref 78.0–100.0)
Monocytes Absolute: 0.6 10*3/uL (ref 0.1–1.0)
Monocytes Relative: 10 % (ref 3.0–12.0)
Neutro Abs: 3.2 10*3/uL (ref 1.4–7.7)
Neutrophils Relative %: 50.6 % (ref 43.0–77.0)
Platelets: 197 10*3/uL (ref 150.0–400.0)
RBC: 5.3 Mil/uL — ABNORMAL HIGH (ref 3.87–5.11)
RDW: 14.6 % (ref 11.5–15.5)
WBC: 6.3 10*3/uL (ref 4.0–10.5)

## 2024-01-09 LAB — COMPREHENSIVE METABOLIC PANEL WITH GFR
ALT: 12 U/L (ref 0–35)
AST: 18 U/L (ref 0–37)
Albumin: 4.8 g/dL (ref 3.5–5.2)
Alkaline Phosphatase: 77 U/L (ref 39–117)
BUN: 11 mg/dL (ref 6–23)
CO2: 29 meq/L (ref 19–32)
Calcium: 10 mg/dL (ref 8.4–10.5)
Chloride: 103 meq/L (ref 96–112)
Creatinine, Ser: 0.66 mg/dL (ref 0.40–1.20)
GFR: 84.03 mL/min (ref >60.00)
Glucose, Bld: 84 mg/dL (ref 70–99)
Potassium: 3.7 meq/L (ref 3.5–5.1)
Sodium: 141 meq/L (ref 135–145)
Total Bilirubin: 0.7 mg/dL (ref 0.2–1.2)
Total Protein: 7.4 g/dL (ref 6.0–8.3)

## 2024-01-09 MED ORDER — ZORYVE 0.3 % EX FOAM: 1.0000 | Freq: Every day | CUTANEOUS | 2 refills | Status: AC

## 2024-01-09 MED ORDER — ZORYVE 0.3 % EX FOAM: 1.0000 | Freq: Every day | CUTANEOUS | 2 refills | Status: DC

## 2024-01-10 ENCOUNTER — Encounter: Payer: Self-pay | Admitting: Internal Medicine

## 2024-01-10 ENCOUNTER — Other Ambulatory Visit (HOSPITAL_COMMUNITY): Payer: Self-pay

## 2024-01-10 ENCOUNTER — Telehealth: Payer: Self-pay

## 2024-01-10 LAB — QUANTIFERON-TB GOLD PLUS
Mitogen-NIL: 6.92 [IU]/mL
NIL: 0.05 [IU]/mL
QuantiFERON-TB Gold Plus: POSITIVE — AB
TB1-NIL: 3.32 [IU]/mL
TB2-NIL: 3.34 [IU]/mL

## 2024-01-10 LAB — IRON,TIBC AND FERRITIN PANEL
%SAT: 48 % — ABNORMAL HIGH (ref 16–45)
Ferritin: 39 ng/mL (ref 16–288)
Iron: 161 ug/dL — ABNORMAL HIGH (ref 45–160)
TIBC: 337 ug/dL (ref 250–450)

## 2024-01-10 NOTE — Telephone Encounter (Signed)
*  Primary  Pharmacy Patient Advocate Encounter   Received notification from Fax that prior authorization for Temazepam 15mg  capsules is required/requested.   Insurance verification completed.   The patient is insured through [employer] .   Per test claim:  1 capsule per day is preferred by the insurance.  If suggested medication is appropriate, Please send in a new RX and discontinue this one. If not, please advise as to why it's not appropriate so that we may request a Prior Authorization. Please note, some preferred medications may still require a PA.  If the suggested medications have not been trialed and there are no contraindications to their use, the PA will not be submitted, as it will not be approved.

## 2024-01-11 ENCOUNTER — Other Ambulatory Visit (HOSPITAL_COMMUNITY): Payer: Self-pay

## 2024-01-11 MED ORDER — TEMAZEPAM 30 MG PO CAPS: 30.0000 mg | ORAL_CAPSULE | Freq: Every evening | ORAL | 3 refills | Status: DC | PRN

## 2024-01-11 NOTE — Telephone Encounter (Signed)
 Noted.  I will send the prescription for 30 mg capsules.  Thanks

## 2024-01-15 ENCOUNTER — Telehealth: Payer: Self-pay | Admitting: Internal Medicine

## 2024-01-15 NOTE — Telephone Encounter (Signed)
 Copied from CRM [id number]. Topic: Clinical - Lab/Test Results >> Jan 12, 2024  3:27 PM Saverio Danker wrote: Reason for CRM: Patient is calling to compare lab results from her last and current results. Would like a call around 1pm on 01/15/24.

## 2024-01-16 ENCOUNTER — Other Ambulatory Visit (HOSPITAL_COMMUNITY): Payer: Self-pay

## 2024-01-16 ENCOUNTER — Telehealth: Payer: Self-pay | Admitting: Pharmacy Technician

## 2024-01-16 NOTE — Telephone Encounter (Signed)
 Pharmacy Patient Advocate Encounter  Received notification from [facility] that Prior Authorization for Temazepam 30MG  capsules has been DENIED.  Full denial letter will be uploaded to the media tab. See denial reason below.   PA #/Case ID/Reference #: [id number]

## 2024-01-16 NOTE — Telephone Encounter (Signed)
 Pharmacy Patient Advocate Encounter   Received notification from CoverMyMeds that prior authorization for Temazepam 30MG  capsules is required/requested.   Insurance verification completed.   The patient is insured through [facility] .   Per test claim: PA required; PA submitted to above mentioned insurance via CoverMyMeds Key/confirmation #/EOC [id number] Status is pending

## 2024-01-18 ENCOUNTER — Other Ambulatory Visit (HOSPITAL_COMMUNITY): Payer: Self-pay

## 2024-01-23 ENCOUNTER — Telehealth: Payer: Self-pay | Admitting: Internal Medicine

## 2024-01-23 NOTE — Telephone Encounter (Signed)
 PA request has been Denied. New Encounter has been or will be created for follow up. For additional info see Pharmacy Prior Auth telephone encounter from 01/16/24.

## 2024-01-23 NOTE — Telephone Encounter (Signed)
 Copied from CRM [id number]. Topic: Clinical - Prescription Issue >> Jan 23, 2024 12:19 PM [place] A wrote: Reason for CRM: Patient called and said that the pharmacy needs a prior authorization for temazepam (RESTORIL) 30 MG capsule. Patient does not have Mychart please contact her by phone

## 2024-01-29 ENCOUNTER — Other Ambulatory Visit (HOSPITAL_COMMUNITY): Payer: Self-pay

## 2024-01-30 NOTE — Telephone Encounter (Signed)
 Called and informed patient of insurance decision. She had noted she would reach out to the pharmacy to see how much it would be OOP

## 2024-02-15 ENCOUNTER — Encounter (HOSPITAL_COMMUNITY): Payer: Self-pay

## 2024-02-22 ENCOUNTER — Encounter: Payer: Self-pay | Admitting: Internal Medicine

## 2024-02-22 ENCOUNTER — Ambulatory Visit: Admitting: Internal Medicine

## 2024-02-22 VITALS — BP 120/82 | HR 56 | Temp 98.3°F | Ht 61.0 in | Wt 139.0 lb

## 2024-02-22 DIAGNOSIS — J449 Chronic obstructive pulmonary disease, unspecified: Secondary | ICD-10-CM

## 2024-02-22 DIAGNOSIS — R0609 Other forms of dyspnea: Secondary | ICD-10-CM

## 2024-02-22 DIAGNOSIS — G4700 Insomnia, unspecified: Secondary | ICD-10-CM | POA: Insufficient documentation

## 2024-02-22 DIAGNOSIS — H9202 Otalgia, left ear: Secondary | ICD-10-CM | POA: Diagnosis not present

## 2024-02-22 DIAGNOSIS — H04203 Unspecified epiphora, bilateral lacrimal glands: Secondary | ICD-10-CM

## 2024-02-22 DIAGNOSIS — R634 Abnormal weight loss: Secondary | ICD-10-CM | POA: Diagnosis not present

## 2024-02-22 DIAGNOSIS — F5101 Primary insomnia: Secondary | ICD-10-CM

## 2024-02-22 DIAGNOSIS — R7612 Nonspecific reaction to cell mediated immunity measurement of gamma interferon antigen response without active tuberculosis: Secondary | ICD-10-CM | POA: Diagnosis not present

## 2024-02-22 DIAGNOSIS — I6521 Occlusion and stenosis of right carotid artery: Secondary | ICD-10-CM

## 2024-02-22 DIAGNOSIS — K2101 Gastro-esophageal reflux disease with esophagitis, with bleeding: Secondary | ICD-10-CM | POA: Diagnosis not present

## 2024-02-22 DIAGNOSIS — I6523 Occlusion and stenosis of bilateral carotid arteries: Secondary | ICD-10-CM | POA: Diagnosis not present

## 2024-02-22 MED ORDER — TEMAZEPAM 30 MG PO CAPS
30.0000 mg | ORAL_CAPSULE | Freq: Every evening | ORAL | 1 refills | Status: AC | PRN
Start: 2024-02-22 — End: ?

## 2024-02-22 MED ORDER — LORATADINE 10 MG PO TABS: 10.0000 mg | ORAL_TABLET | Freq: Every day | ORAL | 3 refills | Status: AC

## 2024-02-22 NOTE — Assessment & Plan Note (Signed)
 On Temazepam . Other meds do not help

## 2024-02-22 NOTE — Assessment & Plan Note (Signed)
 Wt Readings from Last 3 Encounters:  02/22/24 139 lb (63 kg)  01/08/24 139 lb 9.6 oz (63.3 kg)  10/19/23 141 lb 3.2 oz (64 kg)

## 2024-02-22 NOTE — Assessment & Plan Note (Signed)
 Rifampin  intolerant - d/c Other options discussed

## 2024-02-22 NOTE — Addendum Note (Signed)
 Addended by: Delcie Ruppert V on: 02/22/2024 02:08 PM   Modules accepted: Orders, Level of Service

## 2024-02-22 NOTE — Assessment & Plan Note (Signed)
 2D ECHO Better on Daliresp 

## 2024-02-22 NOTE — Progress Notes (Signed)
 Subjective:  Patient ID: Theresa Aguirre, female    DOB: 1946-04-11  Age: 78 y.o. MRN: [id number]  CC: Medical Management of Chronic Issues   HPI Theresa Aguirre presents for positive TB test, COPD, insomnia  Outpatient Medications Prior to Visit  Medication Sig Dispense Refill   ALPRAZolam  (XANAX ) 0.25 MG tablet Take 1 tablet (0.25 mg total) by mouth 2 (two) times daily as needed for anxiety. 30 tablet 1   aspirin  (BAYER ASPIRIN ) 325 MG tablet Take 0.5 tablets (162 mg total) by mouth daily. 100 tablet 3   b complex vitamins tablet Take 1 tablet by mouth daily. 100 tablet 3   buPROPion  (WELLBUTRIN  XL) 150 MG 24 hr tablet TAKE 1 TABLET(150 MG) BY MOUTH IN THE MORNING 90 tablet 1   clobetasol  cream (TEMOVATE ) 0.05 % Apply 1 application topically 2 (two) times daily. 60 g 3   cloNIDine  (CATAPRES ) 0.1 MG tablet Take 1 tablet (0.1 mg total) by mouth 3 (three) times daily as needed (take if top blood pressure is over 180). 90 tablet 3   furosemide  (LASIX ) 20 MG tablet TAKE 1 TABLET(20 MG) BY MOUTH TWICE DAILY AS NEEDED FOR SWELLING 90 tablet 1   hydrochlorothiazide  (MICROZIDE ) 12.5 MG capsule TAKE 1 CAPSULE(12.5 MG) BY MOUTH DAILY 90 capsule 3   ketotifen  (ZADITOR ) 0.025 % ophthalmic solution Place 2 drops into both eyes 2 (two) times daily. 5 mL 1   levalbuterol  (XOPENEX  HFA) 45 MCG/ACT inhaler INHALE 2 PUFFS INTO THE LUNGS EVERY 6 HOURS AS NEEDED FOR WHEEZING OR SHORTNESS OF BREATH 45 g 3   lipase/protease/amylase (CREON ) 36000 UNITS CPEP capsule Take 1 capsule (36,000 Units total) by mouth 3 (three) times daily before meals. 90 capsule 11   metoprolol  tartrate (LOPRESSOR ) 50 MG tablet Take 2 hours prior to procedure 1 tablet 0   oxymetazoline  (AFRIN 12 HOUR) 0.05 % nasal spray Place 1 spray into both nostrils 2 (two) times daily. No longer than 5 days 30 mL 1   pantoprazole  (PROTONIX ) 40 MG tablet TAKE 1 TABLET(40 MG) BY MOUTH TWICE DAILY 180 tablet 0   potassium chloride  SA (KLOR-CON  M) 20 MEQ  tablet Take 1 tablet (20 mEq total) by mouth daily. 30 tablet 3   predniSONE  (DELTASONE ) 5 MG tablet Take 1 tablet (5 mg total) by mouth daily with breakfast. 30 tablet 5   rifampin  (RIFADIN ) 300 MG capsule Take 2 capsules (600 mg total) by mouth daily. 60 capsule 3   Roflumilast  250 MCG TABS Take 1 tablet by mouth daily. 90 tablet 3   Roflumilast , Antiseborrheic, (ZORYVE ) 0.3 % FOAM Apply 1 Act topically daily. 60 g 2   Simethicone (GAS-X PO) Take by mouth.     Sodium Fluoride  1.1 % PSTE Use bid 100 g 0   Tiotropium Bromide  Monohydrate (SPIRIVA  RESPIMAT) 2.5 MCG/ACT AERS Inhale 2 puffs into the lungs daily. 4 g 11   loratadine  (CLARITIN ) 10 MG tablet TAKE 1 TABLET(10 MG) BY MOUTH DAILY 90 tablet 3   temazepam  (RESTORIL ) 30 MG capsule Take 1 capsule (30 mg total) by mouth at bedtime as needed for sleep. 30 capsule 3   No facility-administered medications prior to visit.    ROS: Review of Systems  Constitutional:  Negative for activity change, appetite change, chills, fatigue and unexpected weight change.  HENT:  Negative for congestion, mouth sores and sinus pressure.   Eyes:  Negative for visual disturbance.  Respiratory:  Positive for shortness of breath. Negative for cough and chest tightness.  Gastrointestinal:  Negative for abdominal pain and nausea.  Genitourinary:  Negative for difficulty urinating, frequency and vaginal pain.  Musculoskeletal:  Negative for back pain and gait problem.  Skin:  Negative for pallor and rash.  Neurological:  Negative for dizziness, tremors, weakness, numbness and headaches.  Psychiatric/Behavioral:  Positive for sleep disturbance. Negative for confusion and suicidal ideas. The patient is nervous/anxious.     Objective:  BP 120/82   Pulse (!) 56   Temp 98.3 F (36.8 C) (Oral)   Ht 5\' 1"  (1.549 m)   Wt 139 lb (63 kg)   SpO2 93%   BMI 26.26 kg/m   BP Readings from Last 3 Encounters:  02/22/24 120/82  01/08/24 120/84  10/19/23 126/78     Wt Readings from Last 3 Encounters:  02/22/24 139 lb (63 kg)  01/08/24 139 lb 9.6 oz (63.3 kg)  10/19/23 141 lb 3.2 oz (64 kg)    Physical Exam Constitutional:      General: She is not in acute distress.    Appearance: Normal appearance. She is well-developed.  HENT:     Head: Normocephalic.     Right Ear: External ear normal.     Left Ear: External ear normal.     Nose: Nose normal.  Eyes:     General:        Right eye: No discharge.        Left eye: No discharge.     Conjunctiva/sclera: Conjunctivae normal.     Pupils: Pupils are equal, round, and reactive to light.  Neck:     Thyroid : No thyromegaly.     Vascular: No JVD.     Trachea: No tracheal deviation.  Cardiovascular:     Rate and Rhythm: Normal rate and regular rhythm.     Heart sounds: Normal heart sounds.  Pulmonary:     Effort: No respiratory distress.     Breath sounds: No stridor. No wheezing.  Abdominal:     General: Bowel sounds are normal. There is no distension.     Palpations: Abdomen is soft. There is no mass.     Tenderness: There is no abdominal tenderness. There is no guarding or rebound.  Musculoskeletal:        General: No tenderness.     Cervical back: Normal range of motion and neck supple. No rigidity.     Right lower leg: No edema.     Left lower leg: No edema.  Lymphadenopathy:     Cervical: No cervical adenopathy.  Skin:    Findings: No erythema or rash.  Neurological:     Mental Status: She is oriented to person, place, and time.     Cranial Nerves: No cranial nerve deficit.     Motor: No abnormal muscle tone.     Coordination: Coordination normal.     Deep Tendon Reflexes: Reflexes normal.  Psychiatric:        Behavior: Behavior normal.        Thought Content: Thought content normal.        Judgment: Judgment normal.     Lab Results  Component Value Date   WBC 6.3 01/08/2024   HGB 15.0 01/08/2024   HCT 45.6 01/08/2024   PLT 197.0 01/08/2024   GLUCOSE 84 01/08/2024    CHOL 228 (H) 11/24/2023   TRIG 89 11/24/2023   HDL 60 11/24/2023   LDLDIRECT 162.8 06/28/2011   LDLCALC 152 (H) 11/24/2023   ALT 12 01/08/2024   AST 18 01/08/2024  NA 141 01/08/2024   K 3.7 01/08/2024   CL 103 01/08/2024   CREATININE 0.66 01/08/2024   BUN 11 01/08/2024   CO2 29 01/08/2024   TSH 0.98 10/19/2023   HGBA1C 5.5 01/28/2020    CT CORONARY MORPH W/CTA COR W/SCORE W/CA W/CM &/OR WO/CM Addendum Date: 03/18/2023 ADDENDUM REPORT: 03/18/2023 02:18 EXAM: OVER-READ INTERPRETATION  CT CHEST The following report is an over-read performed by radiologist Dr. Virgle Grime of [facility] on 03/18/2023. This over-read does not include interpretation of cardiac or coronary anatomy or pathology. The coronary calcium  score/coronary CTA interpretation by the cardiologist is attached. COMPARISON:  None. FINDINGS: Cardiovascular: There are no significant extracardiac vascular findings. Mediastinum/Nodes: There are no enlarged lymph nodes within the visualized mediastinum. Lungs/Pleura: There is no pleural effusion. The visualized lungs appear clear. Upper abdomen: There is a small hiatal hernia. Musculoskeletal/Chest wall: No chest wall mass or suspicious osseous findings within the visualized chest. IMPRESSION: No significant extracardiac findings within the visualized chest. Electronically Signed   By: Virgle Grime M.D.   On: 03/18/2023 02:18   Result Date: 03/18/2023 HISTORY: 78 yo female with chest pain, nonspecific EXAM: Cardiac/Coronary CTA TECHNIQUE: The patient was scanned on a [employer]. PROTOCOL: A 120 kV prospective scan was triggered in the descending thoracic aorta at 111 HU's. Axial non-contrast 3 mm slices were carried out through the heart. The data set was analyzed on a dedicated work station and scored using the Agatson method. Gantry rotation speed was 250 msecs and collimation was .6 mm. Beta blockade and 0.8 mg of sl NTG was given. The 3D data set was  reconstructed in 5% intervals of the 35-75 % of the R-R cycle. Diastolic phases were analyzed on a dedicated work station using MPR, MIP and VRT modes. The patient received 95mL OMNIPAQUE  IOHEXOL  350 MG/ML SOLN contrast. FINDINGS: Quality: Very good, HR 61 Coronary calcium  score: The patient's coronary artery calcium  score is 317, which places the patient in the 76th percentile. Coronary arteries: Normal coronary origins.  Right dominance. Right Coronary Artery: Dominant. Punctate proximal calcification without stenosis. Normal R-PDA and R-PLB branches. Left Main Coronary Artery: Normal. Bifurcates into the LAD and LCx arteries. Left Anterior Descending Coronary Artery: Large anterior artery that wraps around the apex. Minimal 1-24% mixed proximal stenosis. The mid to distal vessel is normal. Large proximal D1 branch without disease. Left Circumflex Artery: Large AV groove vessel. Punctate proximal calcification without stenosis. Large OM branch without disease. Aorta: Normal size, 31 mm at the mid ascending aorta (level of the PA bifurcation) measured double oblique. Aortic atherosclerosis. No dissection. Aortic Valve: Trileaflet. No calcifications. Other findings: Normal pulmonary vein drainage into the left atrium. Normal left atrial appendage without a thrombus. Normal size of the pulmonary artery. IMPRESSION: 1. Smooth-appearing coronary arteries with minimal mixed non-obstructive CAD, CADRADS = 1. 2. Coronary artery calcium  score is 317, which places the patient in the 76th percentile for age/race and sex-matched control. 3. Normal coronary origin with right dominance. 4. Aortic atherosclerosis. 5. Cardiovascular risk factor modification is advised. Electronically Signed: By: Hazle Lites M.D. On: 03/13/2023 19:59    Assessment & Plan:   Problem List Items Addressed This Visit     COPD, moderate (HCC)   On Daliresp  Walk more      Relevant Medications   loratadine  (CLARITIN ) 10 MG tablet   GERD  - Primary   Cont w/Creon       Carotid stenosis   On ASA US  ordered  DOE (dyspnea on exertion)   2D ECHO Better on Daliresp       Weight loss   Wt Readings from Last 3 Encounters:  02/22/24 139 lb (63 kg)  01/08/24 139 lb 9.6 oz (63.3 kg)  10/19/23 141 lb 3.2 oz (64 kg)         Positive QuantiFERON-TB Gold test   Rifampin  intolerant - d/c Other options discussed      Insomnia disorder   On Temazepam . Other meds do not help         Meds ordered this encounter  Medications   temazepam  (RESTORIL ) 30 MG capsule    Sig: Take 1 capsule (30 mg total) by mouth at bedtime as needed for sleep.    Dispense:  90 capsule    Refill:  1    Unable to tolerate other benzodiazepines. Long term use Rx   loratadine  (CLARITIN ) 10 MG tablet    Sig: Take 1 tablet (10 mg total) by mouth daily.    Dispense:  90 tablet    Refill:  3      Follow-up: No follow-ups on file.  Anitra Barn, MD

## 2024-02-22 NOTE — Assessment & Plan Note (Signed)
Cont w/Creon

## 2024-02-22 NOTE — Assessment & Plan Note (Signed)
 On Daliresp  Walk more

## 2024-02-22 NOTE — Assessment & Plan Note (Addendum)
 On ASA US  ordered

## 2024-02-23 ENCOUNTER — Other Ambulatory Visit (HOSPITAL_COMMUNITY): Payer: Self-pay

## 2024-03-01 ENCOUNTER — Other Ambulatory Visit: Payer: Self-pay

## 2024-03-01 DIAGNOSIS — I6521 Occlusion and stenosis of right carotid artery: Secondary | ICD-10-CM

## 2024-03-05 ENCOUNTER — Ambulatory Visit

## 2024-03-05 VITALS — Ht 61.0 in | Wt 139.0 lb

## 2024-03-05 DIAGNOSIS — Z0000 Encounter for general adult medical examination without abnormal findings: Secondary | ICD-10-CM | POA: Diagnosis not present

## 2024-03-05 NOTE — Patient Instructions (Signed)
 Theresa Aguirre , Thank you for taking time out of your busy schedule to complete your Annual Wellness Visit with me. I enjoyed our conversation and look forward to speaking with you again next year. I, as well as your care team,  appreciate your ongoing commitment to your health goals. Please review the following plan we discussed and let me know if I can assist you in the future. Your Game plan/ To Do List    Follow up Visits: Next Medicare AWV with our clinical staff: 03/07/2025.   Have you seen your provider in the last 6 months (3 months if uncontrolled diabetes)? Yes Next Office Visit with your provider: LAST ov ON 02/22/2024.  Patient will call office to schedule.  Clinician Recommendations:  Aim for 30 minutes of exercise or brisk walking, 6-8 glasses of water, and 5 servings of fruits and vegetables each day. You are due for a Hep C screening.   Keep up the good work.      This is a list of the screening recommended for you and due dates:  Health Maintenance  Topic Date Due   Medicare Annual Wellness Visit  Never done   Hepatitis C Screening  Never done   Zoster (Shingles) Vaccine (1 of 2) Never done   DEXA scan (bone density measurement)  Never done   COVID-19 Vaccine (3 - 2024-25 season) 06/11/2023   Flu Shot  05/10/2024   DTaP/Tdap/Td vaccine (2 - Td or Tdap) 08/17/2025   Pneumonia Vaccine  Completed   HPV Vaccine  Aged Out   Meningitis B Vaccine  Aged Out    Advanced directives: (Copy Requested) Please bring a copy of your health care power of attorney and living will to the office to be added to your chart at your convenience. You can mail to [facility] [address] [place], [place] [address] or email to [email or web address] Advance Care Planning is important because it:  [x]  Makes sure you receive the medical care that is consistent with your values, goals, and preferences  [x]  It provides guidance to your family and loved ones and reduces their  decisional burden about whether or not they are making the right decisions based on your wishes.  Follow the link provided in your after visit summary or read over the paperwork we have mailed to you to help you started getting your Advance Directives in place. If you need assistance in completing these, please reach out to us  so that we can help you!  See attachments for Preventive Care and Fall Prevention Tips.

## 2024-03-05 NOTE — Progress Notes (Addendum)
 Subjective:   Theresa Aguirre is a 78 y.o. who presents for a Medicare Wellness preventive visit.  As a reminder, Annual Wellness Visits don't include a physical exam, and some assessments may be limited, especially if this visit is performed virtually. We may recommend an in-person follow-up visit with your provider if needed.  Visit Complete: Virtual I connected with  Theresa Aguirre on 03/05/24 by a audio enabled telemedicine application and verified that I am speaking with the correct person using two identifiers.  Patient Location: Home  Provider Location: Home Office  I discussed the limitations of evaluation and management by telemedicine. The patient expressed understanding and agreed to proceed.  Vital Signs: Because this visit was a virtual/telehealth visit, some criteria may be missing or patient reported. Any vitals not documented were not able to be obtained and vitals that have been documented are patient reported.  VideoDeclined- This patient declined Librarian, academic. Therefore the visit was completed with audio only.  Persons Participating in Visit: Patient.  AWV Questionnaire: Yes: Patient Medicare AWV questionnaire was completed by the patient on 02/22/2024; I have confirmed that all information answered by patient is correct and no changes since this date.  Cardiac Risk Factors include: advanced age (>18men, >62 women)     Objective:     There were no vitals filed for this visit. There is no height or weight on file to calculate BMI.     03/05/2024    3:22 PM 09/22/2016    1:05 PM 09/22/2016   12:58 PM 09/19/2016   12:12 PM 07/13/2016    2:52 PM  Advanced Directives  Does Patient Have a Medical Advance Directive? No No No No No  Would patient like information on creating a medical advance directive?  No - Patient declined No - Patient declined No - Patient declined     Current Medications (verified) Outpatient Encounter  Medications as of 03/05/2024  Medication Sig   ALPRAZolam  (XANAX ) 0.25 MG tablet Take 1 tablet (0.25 mg total) by mouth 2 (two) times daily as needed for anxiety.   aspirin  (BAYER ASPIRIN ) 325 MG tablet Take 0.5 tablets (162 mg total) by mouth daily.   b complex vitamins tablet Take 1 tablet by mouth daily.   loratadine  (CLARITIN ) 10 MG tablet Take 1 tablet (10 mg total) by mouth daily.   pantoprazole  (PROTONIX ) 40 MG tablet TAKE 1 TABLET(40 MG) BY MOUTH TWICE DAILY   Roflumilast  250 MCG TABS Take 1 tablet by mouth daily.   temazepam  (RESTORIL ) 30 MG capsule Take 1 capsule (30 mg total) by mouth at bedtime as needed for sleep.   buPROPion  (WELLBUTRIN  XL) 150 MG 24 hr tablet TAKE 1 TABLET(150 MG) BY MOUTH IN THE MORNING (Patient not taking: Reported on 03/05/2024)   clobetasol  cream (TEMOVATE ) 0.05 % Apply 1 application topically 2 (two) times daily. (Patient not taking: Reported on 03/05/2024)   cloNIDine  (CATAPRES ) 0.1 MG tablet Take 1 tablet (0.1 mg total) by mouth 3 (three) times daily as needed (take if top blood pressure is over 180). (Patient not taking: Reported on 03/05/2024)   furosemide  (LASIX ) 20 MG tablet TAKE 1 TABLET(20 MG) BY MOUTH TWICE DAILY AS NEEDED FOR SWELLING (Patient not taking: Reported on 03/05/2024)   hydrochlorothiazide  (MICROZIDE ) 12.5 MG capsule TAKE 1 CAPSULE(12.5 MG) BY MOUTH DAILY (Patient not taking: Reported on 03/05/2024)   ketotifen  (ZADITOR ) 0.025 % ophthalmic solution Place 2 drops into both eyes 2 (two) times daily. (Patient not taking: Reported on 03/05/2024)  levalbuterol  (XOPENEX  HFA) 45 MCG/ACT inhaler INHALE 2 PUFFS INTO THE LUNGS EVERY 6 HOURS AS NEEDED FOR WHEEZING OR SHORTNESS OF BREATH (Patient not taking: Reported on 03/05/2024)   lipase/protease/amylase (CREON ) 36000 UNITS CPEP capsule Take 1 capsule (36,000 Units total) by mouth 3 (three) times daily before meals. (Patient not taking: Reported on 03/05/2024)   metoprolol  tartrate (LOPRESSOR ) 50 MG tablet  Take 2 hours prior to procedure (Patient not taking: Reported on 03/05/2024)   oxymetazoline  (AFRIN 12 HOUR) 0.05 % nasal spray Place 1 spray into both nostrils 2 (two) times daily. No longer than 5 days (Patient not taking: Reported on 03/05/2024)   potassium chloride  SA (KLOR-CON  M) 20 MEQ tablet Take 1 tablet (20 mEq total) by mouth daily. (Patient not taking: Reported on 03/05/2024)   predniSONE  (DELTASONE ) 5 MG tablet Take 1 tablet (5 mg total) by mouth daily with breakfast. (Patient not taking: Reported on 03/05/2024)   Roflumilast , Antiseborrheic, (ZORYVE ) 0.3 % FOAM Apply 1 Act topically daily. (Patient not taking: Reported on 03/05/2024)   Simethicone (GAS-X PO) Take by mouth. (Patient not taking: Reported on 03/05/2024)   Sodium Fluoride  1.1 % PSTE Use bid (Patient not taking: Reported on 03/05/2024)   Tiotropium Bromide  Monohydrate (SPIRIVA  RESPIMAT) 2.5 MCG/ACT AERS Inhale 2 puffs into the lungs daily. (Patient not taking: Reported on 03/05/2024)   No facility-administered encounter medications on file as of 03/05/2024.    Allergies (verified) Albuterol , Desvenlafaxine , Doxycycline, Duloxetine, Fluoxetine hcl, Pravastatin , and Rifampin    History: Past Medical History:  Diagnosis Date   Allergy    Anxiety    Arthropathy, unspecified, site unspecified    Asthma    Back pain    Cataract    Chronic airway obstruction, not elsewhere classified    Depressive disorder, not elsewhere classified    Disorder of bone and cartilage, unspecified    Esophageal reflux    Gluten intolerance    ? Possible   Hypertension    Neck pain    Other abnormal glucose    Other and unspecified hyperlipidemia    diet controlled, no med   Paresthesias in right hand    1-2-3 digits post CTS surgery   Past Surgical History:  Procedure Laterality Date   ABDOMINAL HYSTERECTOMY     CARPAL TUNNEL RELEASE     Bilateral   COLONOSCOPY  15 years ago    in [place]  with Dr.Kaplan   ELBOW SURGERY      Bilateral   EUS N/A 09/22/2016   Procedure: ESOPHAGEAL ENDOSCOPIC ULTRASOUND (EUS) RADIAL;  Surgeon: Janel Medford, MD;  Location: [facility];  Service: Endoscopy;  Laterality: N/A;   Family History  Problem Relation Age of Onset   Cancer Mother 61       brain tumor   Arthritis Father        RA   Hypertension Other    Colon cancer Neg Hx    Esophageal cancer Neg Hx    Rectal cancer Neg Hx    Stomach cancer Neg Hx    Colon polyps Neg Hx    Social History   Socioeconomic History   Marital status: Married    Spouse name: Manufacturing engineer   Number of children: Not on file   Years of education: Not on file   Highest education level: Not on file  Occupational History   Occupation: RETIRED  Tobacco Use   Smoking status: Every Day    Current packs/day: 0.25    Average packs/day: 0.3 packs/day for  40.0 years (10.0 ttl pk-yrs)    Types: Cigarettes   Smokeless tobacco: Never   Tobacco comments:    I usually smoke about 10 cigarettes a day.  Regular Exercise-No  Vaping Use   Vaping status: Never Used  Substance and Sexual Activity   Alcohol use: No   Drug use: Yes    Types: Marijuana    Comment: occ. to sleep per pt   Sexual activity: Not on file    Comment: Hysterectomy  Other Topics Concern   Not on file  Social History Narrative   Lives with husband/2025   She is her husbands care taker.   Social Drivers of Corporate investment banker Strain: Low Risk  (03/05/2024)   Overall Financial Resource Strain (CARDIA)    Difficulty of Paying Living Expenses: Not hard at all  Food Insecurity: No Food Insecurity (03/05/2024)   Hunger Vital Sign    Worried About Running Out of Food in the Last Year: Never true    Ran Out of Food in the Last Year: Never true  Transportation Needs: No Transportation Needs (03/05/2024)   PRAPARE - Administrator, Civil Service (Medical): No    Lack of Transportation (Non-Medical): No  Physical Activity: Insufficiently Active (03/05/2024)    Exercise Vital Sign    Days of Exercise per Week: 5 days    Minutes of Exercise per Session: 20 min  Stress: No Stress Concern Present (03/05/2024)   [employer] of Occupational Health - Occupational Stress Questionnaire    Feeling of Stress : Not at all  Social Connections: Moderately Isolated (03/05/2024)   Social Connection and Isolation Panel [NHANES]    Frequency of Communication with Friends and Family: More than three times a week    Frequency of Social Gatherings with Friends and Family: Never    Attends Religious Services: Never    Database administrator or Organizations: No    Attends Engineer, structural: Never    Marital Status: Married    Tobacco Counseling Ready to quit: Not Answered Counseling given: Not Answered Tobacco comments: I usually smoke about 10 cigarettes a day.  Regular Exercise-No    Clinical Intake:  Pre-visit preparation completed: Yes  Pain : No/denies pain     Nutritional Risks: None Diabetes: No  Lab Results  Component Value Date   HGBA1C 5.5 01/28/2020   HGBA1C 5.6 01/02/2008     How often do you need to have someone help you when you read instructions, pamphlets, or other written materials from your doctor or pharmacy?: 1 - Never     Information entered by :: Azaiah Mello, RMA   Activities of Daily Living     03/05/2024    3:12 PM  In your present state of health, do you have any difficulty performing the following activities:  Hearing? 1  Comment per pt-has some hearing loss-ears clogged  Vision? 0  Difficulty concentrating or making decisions? 0  Walking or climbing stairs? 0  Dressing or bathing? 0  Doing errands, shopping? 0  Preparing Food and eating ? N  Using the Toilet? N  In the past six months, have you accidently leaked urine? N  Do you have problems with loss of bowel control? N  Managing your Medications? N  Managing your Finances? N  Housekeeping or managing your Housekeeping? N     Patient Care Team: Plotnikov, Oakley Bellman, MD as PCP - General Katheryne Pane Frederico Jan, MD as PCP - Cardiology (Cardiology)  Royal Cordon, MD as Referring Physician (Dermatology)  Indicate any recent Medical Services you may have received from other than [facility] providers in the past year (date may be approximate).     Assessment:    This is a routine wellness examination for Theresa Aguirre.  Hearing/Vision screen Vision Screening - Comments:: Wears eyeglasses   Goals Addressed   None    Depression Screen NO CHANGES FROM 02/22/24-PER PT    02/22/2024    1:44 PM 02/22/2024    1:43 PM 06/07/2023    2:32 PM 11/08/2022    2:25 PM 08/03/2022    3:36 PM 06/10/2022    2:56 PM 01/19/2022    3:26 PM  PHQ 2/9 Scores  PHQ - 2 Score 0 0 0 0 0  0  PHQ- 9 Score 0  0 0 0  0  Exception Documentation      Patient refusal     Fall Risk     02/22/2024    1:43 PM 06/07/2023    2:32 PM 11/08/2022    2:24 PM 06/10/2022    2:56 PM 05/14/2021    1:56 PM  Fall Risk   Falls in the past year? 0 0 0 0 0  Number falls in past yr: 0 0 0  0  Injury with Fall? 0 0 0 0 0  Risk for fall due to : No Fall Risks No Fall Risks No Fall Risks No Fall Risks No Fall Risks  Follow up Falls evaluation completed Falls evaluation completed Falls evaluation completed Falls evaluation completed     MEDICARE RISK AT HOME:  Medicare Risk at Home Any stairs in or around the home?: No If so, are there any without handrails?: No Home free of loose throw rugs in walkways, pet beds, electrical cords, etc?: Yes Adequate lighting in your home to reduce risk of falls?: Yes Life alert?: No Use of a cane, walker or w/c?: No Grab bars in the bathroom?: Yes Shower chair or bench in shower?: Yes Elevated toilet seat or a handicapped toilet?: Yes  TIMED UP AND GO:  Was the test performed?  No  Cognitive Function: Declined/Normal: No cognitive concerns noted by patient or family. Patient alert, oriented, able to answer questions  appropriately and recall recent events. No signs of memory loss or confusion.        Immunizations Immunization History  Administered Date(s) Administered   Fluad Quad(high Dose 65+) 07/25/2019, 08/11/2020, 10/13/2021, 08/03/2022   Influenza Whole 08/14/2009   Influenza, High Dose Seasonal PF 07/31/2013, 08/18/2015, 07/24/2016, 06/02/2017, 10/04/2018   Influenza,inj,Quad PF,6+ Mos 08/07/2014   PFIZER(Purple Top)SARS-COV-2 Vaccination 12/02/2019, 12/23/2019   PNEUMOCOCCAL CONJUGATE-20 11/08/2022   Pneumococcal Conjugate-13 09/25/2013   Pneumococcal Polysaccharide-23 05/05/2010, 06/01/2020   Tdap 08/18/2015    Screening Tests Health Maintenance  Topic Date Due   Medicare Annual Wellness (AWV)  Never done   Hepatitis C Screening  Never done   Zoster Vaccines- Shingrix (1 of 2) Never done   DEXA SCAN  Never done   COVID-19 Vaccine (3 - 2024-25 season) 06/11/2023   INFLUENZA VACCINE  05/10/2024   DTaP/Tdap/Td (2 - Td or Tdap) 08/17/2025   Pneumonia Vaccine 69+ Years old  Completed   HPV VACCINES  Aged Out   Meningococcal B Vaccine  Aged Out    Health Maintenance  Health Maintenance Due  Topic Date Due   Medicare Annual Wellness (AWV)  Never done   Hepatitis C Screening  Never done   Zoster Vaccines- Shingrix (1  of 2) Never done   DEXA SCAN  Never done   COVID-19 Vaccine (3 - 2024-25 season) 06/11/2023   Health Maintenance Items Addressed: See Nurse Notes  Additional Screening:  Vision Screening: Recommended annual ophthalmology exams for early detection of glaucoma and other disorders of the eye.  Dental Screening: Recommended annual dental exams for proper oral hygiene  Community Resource Referral / Chronic Care Management: CRR required this visit?  No   CCM required this visit?  No   Plan:    I have personally reviewed and noted the following in the patient's chart:   Medical and social history Use of alcohol, tobacco or illicit drugs  Current  medications and supplements including opioid prescriptions. Patient is not currently taking opioid prescriptions. Functional ability and status Nutritional status Physical activity Advanced directives List of other physicians Hospitalizations, surgeries, and ER visits in previous 12 months Vitals Screenings to include cognitive, depression, and falls Referrals and appointments  In addition, I have reviewed and discussed with patient certain preventive protocols, quality metrics, and best practice recommendations. A written personalized care plan for preventive services as well as general preventive health recommendations were provided to patient.   Ephraim Reichel L Aima Mcwhirt, CMA   03/05/2024   After Visit Summary: (Mail) Due to this being a telephonic visit, the after visit summary with patients personalized plan was offered to patient via mail   Notes: Please refer to Routing Comments.  Medical screening examination/treatment/procedure(s) were performed by non-physician practitioner and as supervising physician I was immediately available for consultation/collaboration.  I agree with above. Adelaide Holy, MD

## 2024-03-11 ENCOUNTER — Other Ambulatory Visit (HOSPITAL_COMMUNITY): Payer: Self-pay

## 2024-03-15 ENCOUNTER — Ambulatory Visit (HOSPITAL_COMMUNITY)
Admission: RE | Admit: 2024-03-15 | Discharge: 2024-03-15 | Disposition: A | Discharge status: Home / Self Care | Source: Ambulatory Visit | Attending: Vascular Surgery | Admitting: Vascular Surgery

## 2024-03-15 DIAGNOSIS — I6521 Occlusion and stenosis of right carotid artery: Secondary | ICD-10-CM

## 2024-03-17 ENCOUNTER — Ambulatory Visit: Payer: Self-pay | Admitting: Internal Medicine

## 2024-04-04 DIAGNOSIS — H01001 Unspecified blepharitis right upper eyelid: Secondary | ICD-10-CM | POA: Diagnosis not present

## 2024-04-04 DIAGNOSIS — H2513 Age-related nuclear cataract, bilateral: Secondary | ICD-10-CM | POA: Diagnosis not present

## 2024-04-04 DIAGNOSIS — H04221 Epiphora due to insufficient drainage, right lacrimal gland: Secondary | ICD-10-CM | POA: Diagnosis not present

## 2024-04-04 DIAGNOSIS — H01004 Unspecified blepharitis left upper eyelid: Secondary | ICD-10-CM | POA: Diagnosis not present

## 2024-04-09 DIAGNOSIS — H608X2 Other otitis externa, left ear: Secondary | ICD-10-CM | POA: Diagnosis not present

## 2024-05-06 ENCOUNTER — Other Ambulatory Visit (HOSPITAL_COMMUNITY): Payer: Self-pay

## 2024-07-19 ENCOUNTER — Ambulatory Visit: Payer: Self-pay

## 2024-07-19 NOTE — Telephone Encounter (Signed)
  FYI Only or Action Required?: FYI only for provider.  Patient was last seen in primary care on 02/22/2024 by Plotnikov, Karlynn GAILS, MD.  Called Nurse Triage reporting Foot Swelling.  Symptoms began intermittent, chronic.  Interventions attempted: Nothing.  Symptoms are: stable.  Triage Disposition: See PCP Within 2 Weeks  Patient/caregiver understands and will follow disposition?: Yes Copied from CRM [id number]. Topic: Clinical - Red Word Triage >> Jul 19, 2024  3:37 PM Franky GRADE wrote: Red Word that prompted transfer to Nurse Triage: Patient is experiencing tingling sensation in her feet and swelling in the feet and low energy. Reason for Disposition  [1] MILD swelling of both ankles (i.e., pedal edema) AND [2] is a chronic symptom (recurrent or ongoing AND present > 4 weeks)  Answer Assessment - Initial Assessment Questions 1. ONSET: When did the swelling start? (e.g., minutes, hours, days)     Transient swelling 2. LOCATION: What part of the leg is swollen?  Are both legs swollen or just one leg?     Left foot/ankle swelling 3. SEVERITY: How bad is the swelling? (e.g., localized; mild, moderate, severe)     Just a little swollen 4. REDNESS: Is there redness or signs of infection?     denies 5. PAIN: Is the swelling painful to touch? If Yes, ask: How painful is it?   (Scale 1-10; mild, moderate or severe)     denies 6. FEVER: Do you have a fever? If Yes, ask: What is it, how was it measured, and when did it start?      denies 7. CAUSE: What do you think is causing the leg swelling?     I'm old, sometimes my ankles swell a little 8. MEDICAL HISTORY: Do you have a history of blood clots (e.g., DVT), cancer, heart failure, kidney disease, or liver failure?     COPD 9. RECURRENT SYMPTOM: Have you had leg swelling before? If Yes, ask: When was the last time? What happened that time?     Occasional swelling 10. OTHER SYMPTOMS: Do you have any other  symptoms? (e.g., chest pain, difficulty breathing)       Some shortness of breath with physical work and sometime tired more easily 11. PREGNANCY: Is there any chance you are pregnant? When was your last menstrual period?       N/a  Protocols used: Leg Swelling and Edema-A-AH

## 2024-07-23 DIAGNOSIS — Z01818 Encounter for other preprocedural examination: Secondary | ICD-10-CM | POA: Diagnosis not present

## 2024-07-23 DIAGNOSIS — H04563 Stenosis of bilateral lacrimal punctum: Secondary | ICD-10-CM | POA: Diagnosis not present

## 2024-07-23 DIAGNOSIS — H527 Unspecified disorder of refraction: Secondary | ICD-10-CM | POA: Diagnosis not present

## 2024-07-23 DIAGNOSIS — H0100A Unspecified blepharitis right eye, upper and lower eyelids: Secondary | ICD-10-CM | POA: Diagnosis not present

## 2024-07-23 DIAGNOSIS — H04223 Epiphora due to insufficient drainage, bilateral lacrimal glands: Secondary | ICD-10-CM | POA: Diagnosis not present

## 2024-07-23 DIAGNOSIS — H04553 Acquired stenosis of bilateral nasolacrimal duct: Secondary | ICD-10-CM | POA: Diagnosis not present

## 2024-07-23 DIAGNOSIS — H0100B Unspecified blepharitis left eye, upper and lower eyelids: Secondary | ICD-10-CM | POA: Diagnosis not present

## 2024-07-23 DIAGNOSIS — H0279 Other degenerative disorders of eyelid and periocular area: Secondary | ICD-10-CM | POA: Diagnosis not present

## 2024-07-24 ENCOUNTER — Ambulatory Visit: Admitting: Internal Medicine

## 2024-07-25 ENCOUNTER — Encounter: Payer: Self-pay | Admitting: Internal Medicine

## 2024-07-25 ENCOUNTER — Ambulatory Visit (INDEPENDENT_AMBULATORY_CARE_PROVIDER_SITE_OTHER): Admitting: Internal Medicine

## 2024-07-25 VITALS — BP 126/76 | HR 92 | Temp 98.3°F | Ht 61.0 in | Wt 132.8 lb

## 2024-07-25 DIAGNOSIS — E559 Vitamin D deficiency, unspecified: Secondary | ICD-10-CM | POA: Diagnosis not present

## 2024-07-25 DIAGNOSIS — F17200 Nicotine dependence, unspecified, uncomplicated: Secondary | ICD-10-CM

## 2024-07-25 DIAGNOSIS — F419 Anxiety disorder, unspecified: Secondary | ICD-10-CM

## 2024-07-25 DIAGNOSIS — E785 Hyperlipidemia, unspecified: Secondary | ICD-10-CM

## 2024-07-25 DIAGNOSIS — R739 Hyperglycemia, unspecified: Secondary | ICD-10-CM

## 2024-07-25 DIAGNOSIS — F331 Major depressive disorder, recurrent, moderate: Secondary | ICD-10-CM | POA: Diagnosis not present

## 2024-07-25 DIAGNOSIS — K2101 Gastro-esophageal reflux disease with esophagitis, with bleeding: Secondary | ICD-10-CM | POA: Diagnosis not present

## 2024-07-25 DIAGNOSIS — M170 Bilateral primary osteoarthritis of knee: Secondary | ICD-10-CM

## 2024-07-25 DIAGNOSIS — D509 Iron deficiency anemia, unspecified: Secondary | ICD-10-CM

## 2024-07-25 DIAGNOSIS — J449 Chronic obstructive pulmonary disease, unspecified: Secondary | ICD-10-CM | POA: Diagnosis not present

## 2024-07-25 LAB — CBC WITH DIFFERENTIAL/PLATELET
Basophils Absolute: 0.1 K/uL (ref 0.0–0.1)
Basophils Relative: 0.8 % (ref 0.0–3.0)
Eosinophils Absolute: 0.3 K/uL (ref 0.0–0.7)
Eosinophils Relative: 4.8 % (ref 0.0–5.0)
HCT: 43.6 % (ref 36.0–46.0)
Hemoglobin: 14.1 g/dL (ref 12.0–15.0)
Lymphocytes Relative: 31.5 % (ref 12.0–46.0)
Lymphs Abs: 2.1 K/uL (ref 0.7–4.0)
MCHC: 32.4 g/dL (ref 30.0–36.0)
MCV: 81 fl (ref 78.0–100.0)
Monocytes Absolute: 0.6 K/uL (ref 0.1–1.0)
Monocytes Relative: 8.9 % (ref 3.0–12.0)
Neutro Abs: 3.6 K/uL (ref 1.4–7.7)
Neutrophils Relative %: 54 % (ref 43.0–77.0)
Platelets: 217 K/uL (ref 150.0–400.0)
RBC: 5.38 Mil/uL — ABNORMAL HIGH (ref 3.87–5.11)
RDW: 13.4 % (ref 11.5–15.5)
WBC: 6.7 K/uL (ref 4.0–10.5)

## 2024-07-25 LAB — COMPREHENSIVE METABOLIC PANEL WITH GFR
ALT: 9 U/L (ref 0–35)
AST: 13 U/L (ref 0–37)
Albumin: 4.6 g/dL (ref 3.5–5.2)
Alkaline Phosphatase: 89 U/L (ref 39–117)
BUN: 17 mg/dL (ref 6–23)
CO2: 30 meq/L (ref 19–32)
Calcium: 9.5 mg/dL (ref 8.4–10.5)
Chloride: 102 meq/L (ref 96–112)
Creatinine, Ser: 0.62 mg/dL (ref 0.40–1.20)
GFR: 84.99 mL/min (ref >60.00)
Glucose, Bld: 81 mg/dL (ref 70–99)
Potassium: 4 meq/L (ref 3.5–5.1)
Sodium: 141 meq/L (ref 135–145)
Total Bilirubin: 0.7 mg/dL (ref 0.2–1.2)
Total Protein: 7 g/dL (ref 6.0–8.3)

## 2024-07-25 LAB — MAGNESIUM: Magnesium: 2 mg/dL (ref 1.5–2.5)

## 2024-07-25 LAB — TSH: TSH: 0.75 u[IU]/mL (ref 0.35–5.50)

## 2024-07-25 LAB — T4, FREE: Free T4: 0.75 ng/dL (ref 0.60–1.60)

## 2024-07-25 LAB — HEMOGLOBIN A1C: Hgb A1c MFr Bld: 5.5 % (ref 4.6–6.5)

## 2024-07-25 LAB — VITAMIN D 25 HYDROXY (VIT D DEFICIENCY, FRACTURES): VITD: 47.07 ng/mL (ref 30.00–100.00)

## 2024-07-25 MED ORDER — LEVALBUTEROL TARTRATE 45 MCG/ACT IN AERO: 2.0000 | INHALATION_SPRAY | Freq: Four times a day (QID) | RESPIRATORY_TRACT | 3 refills | Status: AC | PRN

## 2024-07-25 MED ORDER — PREDNISONE 5 MG PO TABS: 5.0000 mg | ORAL_TABLET | Freq: Every day | ORAL | 5 refills | Status: AC

## 2024-07-25 NOTE — Assessment & Plan Note (Signed)
 B OA L>R F/u w/Dr Claudene steroid inj ?Synvisc inj discussed OK oral Prednisone  low dose if worse

## 2024-07-25 NOTE — Assessment & Plan Note (Signed)
Xanax prn - rare 

## 2024-07-25 NOTE — Assessment & Plan Note (Signed)
Vaping

## 2024-07-25 NOTE — Assessment & Plan Note (Signed)
Increase Protonix to bid °

## 2024-07-25 NOTE — Assessment & Plan Note (Signed)
 On Daliresp  Xopenex  prn Stop vaping Prednisone  if worse

## 2024-07-25 NOTE — Assessment & Plan Note (Signed)
 Worse. Treat pain

## 2024-07-25 NOTE — Assessment & Plan Note (Signed)
Check CBC, iron 

## 2024-07-25 NOTE — Assessment & Plan Note (Signed)
Statin intolerant Declined other options

## 2024-07-25 NOTE — Progress Notes (Signed)
 Subjective:  Patient ID: Theresa Aguirre, female    DOB: 12-31-1945  Age: 78 y.o. MRN: [id number]  CC: Medical Management of Chronic Issues (3 Month follow up)   HPI Theresa Aguirre presents for OA, knee pain, HTN, COPD, anxiety  Outpatient Medications Prior to Visit  Medication Sig Dispense Refill   ALPRAZolam  (XANAX ) 0.25 MG tablet Take 1 tablet (0.25 mg total) by mouth 2 (two) times daily as needed for anxiety. 30 tablet 1   aspirin  (BAYER ASPIRIN ) 325 MG tablet Take 0.5 tablets (162 mg total) by mouth daily. 100 tablet 3   b complex vitamins tablet Take 1 tablet by mouth daily. 100 tablet 3   loratadine  (CLARITIN ) 10 MG tablet Take 1 tablet (10 mg total) by mouth daily. 90 tablet 3   pantoprazole  (PROTONIX ) 40 MG tablet TAKE 1 TABLET(40 MG) BY MOUTH TWICE DAILY 180 tablet 0   Roflumilast  250 MCG TABS Take 1 tablet by mouth daily. 90 tablet 3   temazepam  (RESTORIL ) 30 MG capsule Take 1 capsule (30 mg total) by mouth at bedtime as needed for sleep. 90 capsule 1   clobetasol  cream (TEMOVATE ) 0.05 % Apply 1 application topically 2 (two) times daily. (Patient not taking: Reported on 07/25/2024) 60 g 3   cloNIDine  (CATAPRES ) 0.1 MG tablet Take 1 tablet (0.1 mg total) by mouth 3 (three) times daily as needed (take if top blood pressure is over 180). (Patient not taking: Reported on 07/25/2024) 90 tablet 3   furosemide  (LASIX ) 20 MG tablet TAKE 1 TABLET(20 MG) BY MOUTH TWICE DAILY AS NEEDED FOR SWELLING (Patient not taking: Reported on 07/25/2024) 90 tablet 1   hydrochlorothiazide  (MICROZIDE ) 12.5 MG capsule TAKE 1 CAPSULE(12.5 MG) BY MOUTH DAILY (Patient not taking: Reported on 07/25/2024) 90 capsule 3   ketotifen  (ZADITOR ) 0.025 % ophthalmic solution Place 2 drops into both eyes 2 (two) times daily. (Patient not taking: Reported on 07/25/2024) 5 mL 1   lipase/protease/amylase (CREON ) 36000 UNITS CPEP capsule Take 1 capsule (36,000 Units total) by mouth 3 (three) times daily before meals.  (Patient not taking: Reported on 07/25/2024) 90 capsule 11   metoprolol  tartrate (LOPRESSOR ) 50 MG tablet Take 2 hours prior to procedure (Patient not taking: Reported on 07/25/2024) 1 tablet 0   oxymetazoline  (AFRIN 12 HOUR) 0.05 % nasal spray Place 1 spray into both nostrils 2 (two) times daily. No longer than 5 days (Patient not taking: Reported on 07/25/2024) 30 mL 1   potassium chloride  SA (KLOR-CON  M) 20 MEQ tablet Take 1 tablet (20 mEq total) by mouth daily. (Patient not taking: Reported on 07/25/2024) 30 tablet 3   Roflumilast , Antiseborrheic, (ZORYVE ) 0.3 % FOAM Apply 1 Act topically daily. (Patient not taking: Reported on 07/25/2024) 60 g 2   Simethicone (GAS-X PO) Take by mouth. (Patient not taking: Reported on 07/25/2024)     Sodium Fluoride  1.1 % PSTE Use bid (Patient not taking: Reported on 07/25/2024) 100 g 0   Tiotropium Bromide  Monohydrate (SPIRIVA  RESPIMAT) 2.5 MCG/ACT AERS Inhale 2 puffs into the lungs daily. (Patient not taking: Reported on 07/25/2024) 4 g 11   buPROPion  (WELLBUTRIN  XL) 150 MG 24 hr tablet TAKE 1 TABLET(150 MG) BY MOUTH IN THE MORNING (Patient not taking: Reported on 07/25/2024) 90 tablet 1   levalbuterol  (XOPENEX  HFA) 45 MCG/ACT inhaler INHALE 2 PUFFS INTO THE LUNGS EVERY 6 HOURS AS NEEDED FOR WHEEZING OR SHORTNESS OF BREATH (Patient not taking: Reported on 07/25/2024) 45 g 3   predniSONE  (DELTASONE ) 5 MG tablet Take  1 tablet (5 mg total) by mouth daily with breakfast. (Patient not taking: Reported on 07/25/2024) 30 tablet 5   No facility-administered medications prior to visit.    ROS: Review of Systems  Constitutional:  Positive for fatigue. Negative for activity change, appetite change, chills and unexpected weight change.  HENT:  Negative for congestion, mouth sores and sinus pressure.   Eyes:  Negative for visual disturbance.  Respiratory:  Negative for cough and chest tightness.   Gastrointestinal:  Negative for abdominal pain and nausea.   Genitourinary:  Negative for difficulty urinating, frequency and vaginal pain.  Musculoskeletal:  Positive for arthralgias, back pain and gait problem.  Skin:  Negative for pallor and rash.  Neurological:  Negative for dizziness, tremors, weakness, numbness and headaches.  Psychiatric/Behavioral:  Negative for confusion and sleep disturbance.     Objective:  BP 126/76   Pulse 92   Temp 98.3 F (36.8 C)   Ht 5' 1 (1.549 m)   Wt 132 lb 12.8 oz (60.2 kg)   SpO2 96%   BMI 25.09 kg/m   BP Readings from Last 3 Encounters:  07/25/24 126/76  02/22/24 120/82  01/08/24 120/84    Wt Readings from Last 3 Encounters:  07/25/24 132 lb 12.8 oz (60.2 kg)  03/05/24 139 lb (63 kg)  02/22/24 139 lb (63 kg)    Physical Exam Constitutional:      General: She is not in acute distress.    Appearance: Normal appearance. She is well-developed.  HENT:     Head: Normocephalic.     Right Ear: External ear normal.     Left Ear: External ear normal.     Nose: Nose normal.  Eyes:     General:        Right eye: No discharge.        Left eye: No discharge.     Conjunctiva/sclera: Conjunctivae normal.     Pupils: Pupils are equal, round, and reactive to light.  Neck:     Thyroid : No thyromegaly.     Vascular: No JVD.     Trachea: No tracheal deviation.  Cardiovascular:     Rate and Rhythm: Normal rate and regular rhythm.     Heart sounds: Normal heart sounds.  Pulmonary:     Effort: No respiratory distress.     Breath sounds: No stridor. No wheezing.  Abdominal:     General: Bowel sounds are normal. There is no distension.     Palpations: Abdomen is soft. There is no mass.     Tenderness: There is no abdominal tenderness. There is no guarding or rebound.  Musculoskeletal:        General: No tenderness.     Cervical back: Normal range of motion and neck supple. No rigidity.     Right lower leg: No edema.     Left lower leg: No edema.  Lymphadenopathy:     Cervical: No cervical  adenopathy.  Skin:    Findings: No erythema or rash.  Neurological:     Mental Status: She is oriented to person, place, and time.     Cranial Nerves: No cranial nerve deficit.     Motor: No abnormal muscle tone.     Coordination: Coordination abnormal.     Gait: Gait abnormal.     Deep Tendon Reflexes: Reflexes normal.  Psychiatric:        Behavior: Behavior normal.        Thought Content: Thought content normal.  Judgment: Judgment normal.   Arthritic dog  Lab Results  Component Value Date   WBC 6.3 01/08/2024   HGB 15.0 01/08/2024   HCT 45.6 01/08/2024   PLT 197.0 01/08/2024   GLUCOSE 84 01/08/2024   CHOL 228 (H) 11/24/2023   TRIG 89 11/24/2023   HDL 60 11/24/2023   LDLDIRECT 162.8 06/28/2011   LDLCALC 152 (H) 11/24/2023   ALT 12 01/08/2024   AST 18 01/08/2024   NA 141 01/08/2024   K 3.7 01/08/2024   CL 103 01/08/2024   CREATININE 0.66 01/08/2024   BUN 11 01/08/2024   CO2 29 01/08/2024   TSH 0.98 10/19/2023   HGBA1C 5.5 01/28/2020    VAS US  CAROTID Result Date: 03/18/2024 Carotid Arterial Duplex Study Patient Name:  Haely Leyland  Date of Exam:   03/15/2024 Medical Rec #: [id number]       Accession #:    [id number] Date of Birth: [birth year]       Patient Gender: F Patient Age:   16 years Exam Location:  [address] Procedure:      VAS US  CAROTID Referring Phys: KARLYNN Attila Mccarthy --------------------------------------------------------------------------------  Indications:  Carotid artery disease. Risk Factors: Hypertension, hyperlipidemia, current smoker. Performing Technologist: Alan Greenhouse RDMS, RVT, RDCS  Examination Guidelines: A complete evaluation includes B-mode imaging, spectral Doppler, color Doppler, and power Doppler as needed of all accessible portions of each vessel. Bilateral testing is considered an integral part of a complete examination. Limited examinations for reoccurring indications may be performed as noted.  Right Carotid Findings:  +----------+--------+--------+--------+-------------------------+--------+           PSV cm/sEDV cm/sStenosisPlaque Description       Comments +----------+--------+--------+--------+-------------------------+--------+ CCA Prox  136     22                                                +----------+--------+--------+--------+-------------------------+--------+ CCA Distal75      15                                                +----------+--------+--------+--------+-------------------------+--------+ ICA Prox  148     42      40-59%  calcific and heterogenous         +----------+--------+--------+--------+-------------------------+--------+ ICA Mid   134     31                                                +----------+--------+--------+--------+-------------------------+--------+ ICA Distal57      14                                                +----------+--------+--------+--------+-------------------------+--------+ ECA       83      10                                                +----------+--------+--------+--------+-------------------------+--------+ +----------+--------+-------+----------------+-------------------+  PSV cm/sEDV cmsDescribe        Arm Pressure (mmHG) +----------+--------+-------+----------------+-------------------+ [id number]      2      Multiphasic, [id number]                 +----------+--------+-------+----------------+-------------------+ +---------+--------+--+--------+--+---------+ VertebralPSV cm/s66EDV cm/s10Antegrade +---------+--------+--+--------+--+---------+  Left Carotid Findings: +----------+--------+--------+--------+-------------------------+--------+           PSV cm/sEDV cm/sStenosisPlaque Description       Comments +----------+--------+--------+--------+-------------------------+--------+ CCA Prox  131     22                                                 +----------+--------+--------+--------+-------------------------+--------+ CCA Distal81      21                                                +----------+--------+--------+--------+-------------------------+--------+ ICA Prox  98      17              calcific and heterogenous         +----------+--------+--------+--------+-------------------------+--------+ ICA Mid   104     30      1-39%                                     +----------+--------+--------+--------+-------------------------+--------+ ICA Distal70      16                                                +----------+--------+--------+--------+-------------------------+--------+ ECA       76      4                                                 +----------+--------+--------+--------+-------------------------+--------+ +----------+--------+--------+----------------+-------------------+           PSV cm/sEDV cm/sDescribe        Arm Pressure (mmHG) +----------+--------+--------+----------------+-------------------+ [id number]     1       Multiphasic, [id number]                 +----------+--------+--------+----------------+-------------------+ +---------+--------+--+--------+--+---------+ VertebralPSV cm/s55EDV cm/s12Antegrade +---------+--------+--+--------+--+---------+   Summary: Right Carotid: Velocities in the right ICA are consistent with a 40-59%                stenosis. Left Carotid: Velocities in the left ICA are consistent with a 1-39% stenosis. Vertebrals:  Bilateral vertebral arteries demonstrate antegrade flow. Subclavians: Normal flow hemodynamics were seen in bilateral subclavian              arteries. *See table(s) above for measurements and observations. Suggest follow up study in 12 months. Electronically signed by Deatrice Cage MD on 03/18/2024 at 8:31:23 AM.    Final     Assessment & Plan:   Problem List Items Addressed This Visit     Anxiety   Xanax  prn rare      COPD,  moderate (HCC)   On  Daliresp  Xopenex  prn Stop vaping Prednisone  if worse       Relevant Medications   levalbuterol  (XOPENEX  HFA) 45 MCG/ACT inhaler   predniSONE  (DELTASONE ) 5 MG tablet   Dyslipidemia   Statin intolerant Declined other options      Relevant Orders   Hemoglobin A1c   GERD    Increase Protonix  to bid       Iron  deficiency anemia - Primary   Check CBC, iron       Relevant Orders   CBC with Differential/Platelet   Comprehensive metabolic panel with GFR   TSH   T4, free   Hemoglobin A1c   VITAMIN D 25 Hydroxy (Vit-D Deficiency, Fractures)   Magnesium    Knee osteoarthritis    B OA L>R F/u w/Dr Claudene steroid inj ?Synvisc inj discussed OK oral Prednisone  low dose if worse      Relevant Medications   predniSONE  (DELTASONE ) 5 MG tablet   Other Relevant Orders   CBC with Differential/Platelet   Comprehensive metabolic panel with GFR   TSH   T4, free   Hemoglobin A1c   VITAMIN D 25 Hydroxy (Vit-D Deficiency, Fractures)   Magnesium    Major depressive disorder, recurrent episode, moderate (HCC)   Worse Treat pain      TOBACCO USE DISORDER/SMOKER-SMOKING CESSATION DISCUSSED   Vaping      Other Visit Diagnoses       Hyperglycemia       Relevant Orders   Hemoglobin A1c     Vitamin D deficiency       Relevant Orders   VITAMIN D 25 Hydroxy (Vit-D Deficiency, Fractures)         Meds ordered this encounter  Medications   levalbuterol  (XOPENEX  HFA) 45 MCG/ACT inhaler    Sig: Inhale 2 puffs into the lungs every 6 (six) hours as needed for wheezing or shortness of breath.    Dispense:  45 g    Refill:  3    ZERO refills remain on this prescription. Your patient is requesting advance approval of refills for this medication to PREVENT ANY MISSED DOSES   predniSONE  (DELTASONE ) 5 MG tablet    Sig: Take 1 tablet (5 mg total) by mouth daily with breakfast.    Dispense:  30 tablet    Refill:  5      Follow-up: Return in about 3 months (around  10/25/2024) for a follow-up visit.  Marolyn Noel, MD

## 2024-07-26 NOTE — Telephone Encounter (Signed)
 The patient was seen in the office on 07/25/2024

## 2024-07-28 ENCOUNTER — Ambulatory Visit: Payer: Self-pay | Admitting: Internal Medicine

## 2024-07-29 NOTE — Addendum Note (Signed)
 Addended by: Edilberto Roosevelt V on: 07/29/2024 09:54 AM   Modules accepted: Level of Service

## 2024-08-27 ENCOUNTER — Telehealth: Payer: Self-pay

## 2024-08-27 NOTE — Telephone Encounter (Signed)
 Copied from CRM [id number]. Topic: General - Other >> Aug 27, 2024 12:07 PM Drema MATSU wrote: Reason for CRM: Patient is needing a copy of her TB test and a copy of her chest xray sent to her job. fax: [phone]

## 2024-08-28 NOTE — Telephone Encounter (Signed)
 I was able to print and fax the documents as requested by the pt.

## 2024-10-13 ENCOUNTER — Other Ambulatory Visit: Payer: Self-pay | Admitting: Internal Medicine

## 2025-03-07 ENCOUNTER — Ambulatory Visit
